# Patient Record
Sex: Female | Born: 1970 | Race: White | Hispanic: No | Smoking: Never smoker
Health system: Southern US, Community
[De-identification: ages and names within clinical notes are randomized; demographics above are authoritative.]

## PROBLEM LIST (undated history)

## (undated) DIAGNOSIS — J45909 Unspecified asthma, uncomplicated: Secondary | ICD-10-CM

## (undated) DIAGNOSIS — C801 Malignant (primary) neoplasm, unspecified: Secondary | ICD-10-CM

## (undated) HISTORY — PX: WISDOM TOOTH EXTRACTION: SHX21

## (undated) HISTORY — PX: NASAL SINUS SURGERY: SHX719

## (undated) HISTORY — PX: RHINOPLASTY: SUR1284

---

## 1998-05-31 ENCOUNTER — Inpatient Hospital Stay (HOSPITAL_COMMUNITY): Admission: AD | Admit: 1998-05-31 | Discharge: 1998-06-02 | Payer: Self-pay | Admitting: *Deleted

## 2002-03-27 ENCOUNTER — Other Ambulatory Visit: Admission: RE | Admit: 2002-03-27 | Discharge: 2002-03-27 | Payer: Self-pay | Admitting: *Deleted

## 2003-04-06 ENCOUNTER — Other Ambulatory Visit: Admission: RE | Admit: 2003-04-06 | Discharge: 2003-04-06 | Payer: Self-pay | Admitting: *Deleted

## 2004-04-12 ENCOUNTER — Other Ambulatory Visit: Admission: RE | Admit: 2004-04-12 | Discharge: 2004-04-12 | Payer: Self-pay | Admitting: Obstetrics and Gynecology

## 2005-04-24 ENCOUNTER — Other Ambulatory Visit: Admission: RE | Admit: 2005-04-24 | Discharge: 2005-04-24 | Payer: Self-pay | Admitting: Obstetrics and Gynecology

## 2007-08-13 ENCOUNTER — Encounter: Admission: RE | Admit: 2007-08-13 | Discharge: 2007-08-13 | Payer: Self-pay | Admitting: Obstetrics and Gynecology

## 2010-12-05 ENCOUNTER — Other Ambulatory Visit: Payer: Self-pay | Admitting: Obstetrics and Gynecology

## 2010-12-05 DIAGNOSIS — R928 Other abnormal and inconclusive findings on diagnostic imaging of breast: Secondary | ICD-10-CM

## 2010-12-12 ENCOUNTER — Ambulatory Visit
Admission: RE | Admit: 2010-12-12 | Discharge: 2010-12-12 | Disposition: A | Discharge status: Home / Self Care | Payer: BC Managed Care – PPO | Source: Ambulatory Visit | Attending: Obstetrics and Gynecology | Admitting: Obstetrics and Gynecology

## 2010-12-12 DIAGNOSIS — R928 Other abnormal and inconclusive findings on diagnostic imaging of breast: Secondary | ICD-10-CM

## 2013-01-01 ENCOUNTER — Other Ambulatory Visit: Payer: Self-pay | Admitting: Obstetrics and Gynecology

## 2013-01-01 DIAGNOSIS — Z803 Family history of malignant neoplasm of breast: Secondary | ICD-10-CM

## 2015-03-28 ENCOUNTER — Encounter: Payer: Self-pay | Admitting: *Deleted

## 2015-03-30 ENCOUNTER — Ambulatory Visit (INDEPENDENT_AMBULATORY_CARE_PROVIDER_SITE_OTHER): Payer: 59 | Admitting: *Deleted

## 2015-03-30 DIAGNOSIS — I781 Nevus, non-neoplastic: Secondary | ICD-10-CM

## 2015-03-30 DIAGNOSIS — I8393 Asymptomatic varicose veins of bilateral lower extremities: Secondary | ICD-10-CM

## 2015-03-30 NOTE — Progress Notes (Signed)
This patient has tiny red capillaries that she wants removed. Used CL since all vessels are too tiny to inject. Did all leg ares and a few on her face. Increase strength since she is olive complection. Suggested [facility]'s bigger head to get tiny cheek vessels. Follow prn.  Cutaneous Laser:pulsed mode  816j/cm2 400 ms delay  13 ms Duration 0.5 spot  Total pulses:875 Total energy 1.394  Total time::10  Face: 490j/cm2  21ms   Pulses: 134  Energy: .156  Photos: No.  Compression stockings applied: No.

## 2015-04-04 ENCOUNTER — Encounter: Payer: Self-pay | Admitting: *Deleted

## 2016-06-04 ENCOUNTER — Encounter: Payer: Self-pay | Admitting: *Deleted

## 2016-06-06 ENCOUNTER — Ambulatory Visit (INDEPENDENT_AMBULATORY_CARE_PROVIDER_SITE_OTHER): Payer: Self-pay | Admitting: *Deleted

## 2016-06-06 DIAGNOSIS — I781 Nevus, non-neoplastic: Secondary | ICD-10-CM

## 2016-06-06 NOTE — Progress Notes (Signed)
X=No results found for: HIV1RNAQUANT% Sotradecol administered with a 27g butterfly.  Patient received a total of 5cc.  Cutaneous Laser:pulsed mode  816j/cm2 400 ms delay  13 ms Duration 0.5 spot  Total pulses: 601 Total energy 957.68  Total time::07    Compression stockings applied: Yes.     Treated her small spiders with a combo of sclero and CL. Not the easiest of access d/t size. Hoping for results she is happy with. Follow prn.

## 2016-11-28 DIAGNOSIS — E785 Hyperlipidemia, unspecified: Secondary | ICD-10-CM | POA: Insufficient documentation

## 2016-11-28 DIAGNOSIS — J309 Allergic rhinitis, unspecified: Secondary | ICD-10-CM | POA: Insufficient documentation

## 2016-12-13 DIAGNOSIS — J45909 Unspecified asthma, uncomplicated: Secondary | ICD-10-CM | POA: Insufficient documentation

## 2017-12-27 DIAGNOSIS — L239 Allergic contact dermatitis, unspecified cause: Secondary | ICD-10-CM | POA: Insufficient documentation

## 2018-02-28 ENCOUNTER — Other Ambulatory Visit: Payer: Self-pay | Admitting: Obstetrics and Gynecology

## 2018-02-28 DIAGNOSIS — Z803 Family history of malignant neoplasm of breast: Secondary | ICD-10-CM

## 2018-05-02 ENCOUNTER — Other Ambulatory Visit: Payer: Self-pay

## 2018-05-16 ENCOUNTER — Ambulatory Visit
Admission: RE | Admit: 2018-05-16 | Discharge: 2018-05-16 | Disposition: A | Discharge status: Home / Self Care | Payer: BLUE CROSS/BLUE SHIELD | Source: Ambulatory Visit | Attending: Obstetrics and Gynecology | Admitting: Obstetrics and Gynecology

## 2018-05-16 DIAGNOSIS — Z803 Family history of malignant neoplasm of breast: Secondary | ICD-10-CM

## 2018-05-16 MED ORDER — GADOBUTROL 1 MMOL/ML IV SOLN
6.0000 mL | Freq: Once | INTRAVENOUS | Status: AC | PRN
  Administered 2018-05-16: 6 mL via INTRAVENOUS

## 2019-02-25 ENCOUNTER — Other Ambulatory Visit: Payer: Self-pay | Admitting: Obstetrics and Gynecology

## 2019-02-25 DIAGNOSIS — R928 Other abnormal and inconclusive findings on diagnostic imaging of breast: Secondary | ICD-10-CM

## 2019-03-06 ENCOUNTER — Other Ambulatory Visit: Payer: Self-pay

## 2019-03-06 ENCOUNTER — Other Ambulatory Visit: Payer: Self-pay | Admitting: Obstetrics and Gynecology

## 2019-03-06 ENCOUNTER — Ambulatory Visit
Admission: RE | Admit: 2019-03-06 | Discharge: 2019-03-06 | Disposition: A | Discharge status: Home / Self Care | Payer: BLUE CROSS/BLUE SHIELD | Source: Ambulatory Visit | Attending: Obstetrics and Gynecology | Admitting: Obstetrics and Gynecology

## 2019-03-06 ENCOUNTER — Ambulatory Visit
Admission: RE | Admit: 2019-03-06 | Discharge: 2019-03-06 | Disposition: A | Discharge status: Home / Self Care | Payer: BC Managed Care – PPO | Source: Ambulatory Visit | Attending: Obstetrics and Gynecology | Admitting: Obstetrics and Gynecology

## 2019-03-06 DIAGNOSIS — R599 Enlarged lymph nodes, unspecified: Secondary | ICD-10-CM

## 2019-03-06 DIAGNOSIS — R928 Other abnormal and inconclusive findings on diagnostic imaging of breast: Secondary | ICD-10-CM

## 2019-03-06 DIAGNOSIS — N6320 Unspecified lump in the left breast, unspecified quadrant: Secondary | ICD-10-CM

## 2019-03-13 ENCOUNTER — Ambulatory Visit
Admission: RE | Admit: 2019-03-13 | Discharge: 2019-03-13 | Disposition: A | Discharge status: Home / Self Care | Payer: BC Managed Care – PPO | Source: Ambulatory Visit | Attending: Obstetrics and Gynecology | Admitting: Obstetrics and Gynecology

## 2019-03-13 ENCOUNTER — Other Ambulatory Visit: Payer: Self-pay

## 2019-03-13 DIAGNOSIS — N6320 Unspecified lump in the left breast, unspecified quadrant: Secondary | ICD-10-CM

## 2019-03-13 DIAGNOSIS — R599 Enlarged lymph nodes, unspecified: Secondary | ICD-10-CM

## 2019-03-17 ENCOUNTER — Other Ambulatory Visit: Payer: Self-pay | Admitting: General Surgery

## 2019-03-17 DIAGNOSIS — C50312 Malignant neoplasm of lower-inner quadrant of left female breast: Secondary | ICD-10-CM

## 2019-03-19 ENCOUNTER — Telehealth: Payer: Self-pay | Admitting: Hematology and Oncology

## 2019-03-19 NOTE — Telephone Encounter (Signed)
Received a new patient referral from Dr. Marlou Starks for breast cancer. Alice Davis has been cld and scheduled to see Alice Davis on 11/17 at 3:45pm. She's been made aware to arrive 15 minutes early.

## 2019-03-23 ENCOUNTER — Ambulatory Visit: Payer: BC Managed Care – PPO | Admitting: Hematology and Oncology

## 2019-03-23 NOTE — Progress Notes (Signed)
[facility] CONSULT NOTE  Patient Care Team: Patient, No Pcp Per as PCP - General (General Practice)  CHIEF COMPLAINTS/PURPOSE OF CONSULTATION:  Newly diagnosed breast cancer  HISTORY OF PRESENTING ILLNESS:  Alice Davis 48 y.o. female is here because of recent diagnosis of invasive mammary carcinoma of the left breast. The cancer was detected on a routine screening mammogram and was not palpable prior to diagnosis. Diagnostic mammogram on 03/06/19 showed a 0.6cm mass at the 8:30 position and a 0.9cm mass at the 8 o'clock position in the left breast, with 1 abnormal left axillary lymph node. Biopsy on 03/13/19 showed, at the 8:30 position, invasive mammary carcinoma and mammary carcinoma in situ, grade 2, HER-2 negative (1+), ER+ 90%, PR+ 90%, Ki67 20%, and at the 8:00 position, HER-2 negative (0), ER+ 80%, PR+ 80%, Ki67 5%, with the lymph node negative for carcinoma. She presents to the clinic today for initial evaluation and discussion of treatment options.  She took birth control pills for a year  I reviewed her records extensively and collaborated the history with the patient.  SUMMARY OF ONCOLOGIC HISTORY: Oncology History  Malignant neoplasm of lower-inner quadrant of left breast in female, estrogen receptor positive ([place])  03/13/2019 Initial Diagnosis   Screening detected left breast abnormalities, 0.6 cm at 8:30 position and 0.9 cm at 8 o'clock position with 1 abnormal left axillary lymph node.  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  Drinks alcohol once a week. Recently married a year ago to her current husband.  She has a son 62 years of age who has  realistic company.  FAMILY HISTORY: Mother had breast cancer. Genetics were performed on her and they were negative. ALLERGIES:  has no allergies on file.  MEDICATIONS: Multivitamin REVIEW OF SYSTEMS:   Constitutional: Denies fevers, chills or abnormal night sweats Eyes: Denies blurriness of vision, double vision or watery eyes Ears, nose, mouth, throat, and face: Denies mucositis or sore throat Respiratory: Denies cough, dyspnea or wheezes Cardiovascular: Denies palpitation, chest discomfort or lower extremity swelling Gastrointestinal:  Denies nausea, heartburn or change in bowel habits Skin: Denies abnormal skin rashes Lymphatics: Denies new lymphadenopathy or easy bruising Neurological:Denies numbness, tingling or new weaknesses Behavioral/Psych: Mood is stable, no new changes  Breast: Denies any palpable lumps or discharge All other systems were reviewed with the patient and are negative.  PHYSICAL EXAMINATION: ECOG PERFORMANCE STATUS: 1 - Symptomatic but completely ambulatory  Vitals:   03/24/19 1541  BP: 117/67  Pulse: 90  Resp: 17  Temp: 98.2 F (36.8 C)  SpO2: 100%   Filed Weights   03/24/19 1541  Weight: 140 lb 14.4 oz (63.9 kg)    GENERAL:alert, no distress and comfortable SKIN: skin color, texture, turgor are normal, no rashes or significant lesions EYES: normal, conjunctiva are pink and non-injected, sclera clear OROPHARYNX:no exudate, no erythema and lips, buccal mucosa, and tongue normal  NECK: supple, thyroid normal size, non-tender, without nodularity LYMPH:  no palpable lymphadenopathy in the cervical, axillary or inguinal LUNGS: clear to auscultation and percussion with normal breathing effort HEART: regular rate & rhythm and no murmurs and no lower extremity edema ABDOMEN:abdomen soft, non-tender and normal bowel  sounds Musculoskeletal:no cyanosis of digits and no clubbing  PSYCH: alert & oriented x 3 with fluent speech NEURO: no focal  motor/sensory deficits BREAST: No palpable nodules in breast. No palpable axillary or supraclavicular lymphadenopathy (exam performed in the presence of a chaperone)   RADIOGRAPHIC STUDIES: I have personally reviewed the radiological reports and agreed with the findings in the report.  ASSESSMENT AND PLAN:  Malignant neoplasm of lower-inner quadrant of left breast in female, estrogen receptor positive ([place]) 03/13/2019:Screening detected left breast abnormalities, 0.6 cm at 8:30 position and 0.9 cm at 8 o'clock position with 1 abnormal left axillary lymph node.  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 After reviewing the pathology in detail, we proceeded to discuss the different treatment options between surgery, radiation, chemotherapy, antiestrogen therapies.  Recommendations: Breast MRI will be performed. Depending on the results the surgical plans will be made by Dr. Marlou Starks.  1. Breast conserving surgery with sentinel lymph node biopsy followed by 2. Oncotype DX testing to determine if chemotherapy would be of any benefit followed by 3. Adjuvant radiation therapy followed by 4. Adjuvant antiestrogen therapy: We discussed tamoxifen for 10 years versus tamoxifen for 3 years followed by aromatase inhibitor for 5 years if she is menopausal.  Oncotype counseling: I discussed Oncotype DX test. I explained to the patient that this is a 21 gene panel to evaluate patient tumors DNA to calculate recurrence score. This would help determine whether patient has high risk  or intermediate risk or low risk breast cancer. She understands that if her tumor was found to be high risk, she would benefit from systemic chemotherapy. If low risk, no need of chemotherapy. If she was found to be intermediate risk, we would need to evaluate the score as well as other risk factors and determine if an abbreviated chemotherapy may be of benefit.  Return to clinic after surgery to discuss final pathology report and then determine if Oncotype DX testing will need to be sent.  All questions were answered. The patient knows to call the clinic with any problems, questions or concerns.   Rulon Eisenmenger, MD, MPH 03/24/2019    I, Molly Dorshimer, am acting as scribe for [name], MD.  I have reviewed the above documentation for accuracy and completeness, and I agree with the above.

## 2019-03-24 ENCOUNTER — Other Ambulatory Visit: Payer: Self-pay

## 2019-03-24 ENCOUNTER — Inpatient Hospital Stay: Payer: BC Managed Care – PPO | Attending: Hematology and Oncology | Admitting: Hematology and Oncology

## 2019-03-24 DIAGNOSIS — C50312 Malignant neoplasm of lower-inner quadrant of left female breast: Secondary | ICD-10-CM | POA: Insufficient documentation

## 2019-03-24 DIAGNOSIS — Z170 Estrogen receptor positive status [ER+]: Secondary | ICD-10-CM | POA: Diagnosis not present

## 2019-03-24 NOTE — Assessment & Plan Note (Addendum)
03/13/2019:Screening detected left breast abnormalities, 0.6 cm at 8:30 position and 0.9 cm at 8 o'clock position with 1 abnormal left axillary lymph node.  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 After reviewing the pathology in detail, we proceeded to discuss the different treatment options between surgery, radiation, chemotherapy, antiestrogen therapies.  Recommendations: 1. Breast conserving surgery followed by 2. Oncotype DX testing to determine if chemotherapy would be of any benefit followed by 3. Adjuvant radiation therapy followed by 4. Adjuvant antiestrogen therapy  Oncotype counseling: I discussed Oncotype DX test. I explained to the patient that this is a 21 gene panel to evaluate patient tumors DNA to calculate recurrence score. This would help determine whether patient has high risk or intermediate risk or low risk breast cancer. She understands that if her tumor was found to be high risk, she would benefit from systemic chemotherapy. If low risk, no need of chemotherapy. If she was found to be intermediate risk, we would need to evaluate the score as well as other risk factors and determine if an abbreviated chemotherapy may be of benefit.  Return to clinic after surgery to discuss final pathology report and then determine if Oncotype DX testing will need to be sent.

## 2019-03-25 NOTE — Progress Notes (Signed)
Location of Breast Cancer: Left Breast  Histology per Pathology Report:  03/13/19 Diagnosis 1. 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  Receptor Status: ER(90%), PR (90%), Her2-neu (NEG), Ki-(20%)  2. Breast, left, needle core biopsy, 8 o'clock - INVASIVE MAMMARY CARCINOMA. MAMMARY CARCINOMA IN SITU.  Receptor Status: ER(80%), PR (80%), Her2-neu (NEG), Ki-(5%)  3. Lymph node, needle/core biopsy, left axilla - NODAL TISSUE, NEGATIVE FOR CARCINOMA.  Did patient present with symptoms or was this found on screening mammography?: It was found on a routine screening mammogram.   Past/Anticipated interventions by surgeon, if any: Dr. Marlou Starks saw initially on 03/17/19   Past/Anticipated interventions by medical oncology, if any:  03/24/19 Dr. Lindi Adie Recommendations: Breast MRI will be performed. Depending on the results the surgical plans will be made by Dr. Marlou Starks.  1. Breast conserving surgery with sentinel lymph node biopsy followed by 2. Oncotype DX testing to determine if chemotherapy would be of any benefit followed by 3. Adjuvant radiation therapy followed by 4. Adjuvant antiestrogen therapy: We discussed tamoxifen for 10 years versus tamoxifen for 3 years followed by aromatase inhibitor for 5 years if she is menopausal.  Lymphedema issues, if any:  N/A  Pain issues, if any:  She denies  SAFETY ISSUES:  Prior radiation? No  Pacemaker/ICD? No  Possible current pregnancy? Her husband has had a vasectomy.   Is the patient on methotrexate? No  Current Complaints / other details:   03/27/19 MRI completed.     Mareena Cavan, Stephani Police, RN 03/25/2019,3:55 PM

## 2019-03-26 ENCOUNTER — Telehealth: Payer: Self-pay | Admitting: *Deleted

## 2019-03-26 NOTE — Telephone Encounter (Signed)
Called pt to provide navigation resources and contact information. Discussed next steps. Denies questions or concerns regarding dx or treatment care plan. Encourage pt to call with needs. Received verbal understanding.

## 2019-03-27 ENCOUNTER — Other Ambulatory Visit: Payer: Self-pay

## 2019-03-27 ENCOUNTER — Ambulatory Visit
Admission: RE | Admit: 2019-03-27 | Discharge: 2019-03-27 | Disposition: A | Discharge status: Home / Self Care | Payer: BC Managed Care – PPO | Source: Ambulatory Visit | Attending: General Surgery | Admitting: General Surgery

## 2019-03-27 DIAGNOSIS — C50312 Malignant neoplasm of lower-inner quadrant of left female breast: Secondary | ICD-10-CM

## 2019-03-27 MED ORDER — GADOBUTROL 1 MMOL/ML IV SOLN
6.0000 mL | Freq: Once | INTRAVENOUS | Status: AC | PRN
  Administered 2019-03-27: 6 mL via INTRAVENOUS

## 2019-03-30 ENCOUNTER — Telehealth: Payer: Self-pay | Admitting: Radiation Oncology

## 2019-03-30 NOTE — Progress Notes (Addendum)
Radiation Oncology         [phone] ________________________________  Initial outpatient Consultation by WebEx Video due to pandemic precautions  Name: Alice Davis MRN: [id number]  Date: 03/31/2019  DOB: 20-Oct-1970  EN:MMHWKGS, Maebelle Munroe, MD  [name], MD   REFERRING PHYSICIAN: Autumn Messing III, MD  DIAGNOSIS:    ICD-10-CM   1. Malignant neoplasm of lower-inner quadrant of left breast in female, estrogen receptor positive ([place])  C50.312    Z17.0    STAGE IA cT1b cN0 cM0 ER+ PR+ HER2- G2 Left  LIQ breast cancer   CHIEF COMPLAINT: Here to discuss management of left breast cancer  HISTORY OF PRESENT ILLNESS::Alice Davis is a 48 y.o. female who presented with breast abnormality on the following imaging: screening mammogram on the date of 02/26/2019.  Symptoms, if any, at that time, were: none.   Ultrasound of breast on 03/06/2019 revealed 0.6 cm mass in left breast at 8:30; 0.9 cm mass in left breast at 8 o'clock; one abnormal left lymph node.   Biopsy on date of 03/13/2019 showed invasive lobular carcinoma with LCIS in both 8:30 and 8 o'clock masses.  ER status: positive; PR status positive, Her2 status negative; Grade 2.  Biopsied lymph node was negative for carcinoma.  Breast MRI performed on 03/27/2019 revealed: suspicious non-mass enhancement involving the entire lower-inner quadrant of the left breast, spanning at least 6-7 cm, the two biopsy-proven malignancy sites are contained within this region; additional suspicious non-mass enhancement involving the lower-outer quadrant of the left breast; suspicious non-mass enhancement involving the lower-inner and lower-outer quadrants of the right breast; no suspicious lymphadenopathy.  Genetic testing was negative at [facility], per patient.   PREVIOUS RADIATION THERAPY: No  PAST MEDICAL HISTORY:  has a past medical history of Asthma and Cancer ([place]).    PAST SURGICAL HISTORY: Past Surgical History:  Procedure  Laterality Date  . BREAST RECONSTRUCTION WITH PLACEMENT OF TISSUE EXPANDER AND FLEX HD (ACELLULAR HYDRATED DERMIS) Bilateral 05/14/2019   Procedure: IMMEDIATE BREAST RECONSTRUCTION WITH PLACEMENT OF TISSUE EXPANDER AND FLEX HD (ACELLULAR HYDRATED DERMIS);  Surgeon: Wallace Going, DO;  Location: [facility];  Service: Plastics;  Laterality: Bilateral;  . NASAL SINUS SURGERY     20 years ago  . NIPPLE SPARING MASTECTOMY WITH SENTINEL LYMPH NODE BIOPSY Bilateral 05/14/2019   Procedure: BILATERAL NIPPLE SPARING MASTECTOMIES WITH LEFT SENTINEL LYMPH NODE BIOPSY;  Surgeon: [name], MD;  Location: [facility];  Service: General;  Laterality: Bilateral;  PEC  . RHINOPLASTY     20 years  . WISDOM TOOTH EXTRACTION      FAMILY HISTORY:  Mother - breast cancer.  SOCIAL HISTORY:  reports that she has never smoked. She has never used smokeless tobacco. She reports previous alcohol use. She reports that she does not use drugs.  ALLERGIES: Latex  MEDICATIONS:  Current Outpatient Medications  Medication Sig Dispense Refill  . cetirizine (ZYRTEC) 10 MG tablet Take 10 mg by mouth daily.     . Multiple Vitamins-Minerals (MULTIVITAMIN ADULT EXTRA C PO) Take 1 tablet by mouth daily.     Marland Kitchen albuterol (VENTOLIN HFA) 108 (90 Base) MCG/ACT inhaler Inhale 2 puffs into the lungs every 6 (six) hours as needed for wheezing or shortness of breath.     . cyclobenzaprine (FLEXERIL) 5 MG tablet Take 1 tablet (5 mg total) by mouth 2 (two) times daily as needed for up to 20 doses for muscle spasms. 20 tablet 0  . ELDERBERRY PO  Take 15 mLs by mouth daily.    . ondansetron (ZOFRAN) 4 MG tablet Take 1 tablet (4 mg total) by mouth every 8 (eight) hours as needed for nausea or vomiting. 20 tablet 0   No current facility-administered medications for this encounter.    REVIEW OF SYSTEMS: As above   PHYSICAL EXAM:  vitals were not taken for this visit.   General: Alert and oriented, in no acute distress    LABS: NA    RADIOGRAPHY: No results found.    IMPRESSION/PLAN: Left Breast Cancer   This is a lovely 48 year old woman with left breast cancer and LCIS.  MRI shows extensive non-mass-like enhancement which will require further biopsies of both breasts before her surgical options are clear.  She is interested in breast conservation if possible, but would like to see a plastic surgeon if mastectomy is recommended.  We discussed the risks benefits and side effects of radiotherapy and the indications for radiotherapy after breast cancer surgery.  She understands that radiotherapy may be needed after mastectomy, depending on the extent of disease and final stage.  If she pursues breast conservation, radiotherapy will be recommended automatically.  It was a pleasure meeting the patient today.  The patient is enthusiastic about proceeding with treatment if indicated. I look forward to participating in the patient's care.  I will await her referral back to me for postoperative follow-up and eventual CT simulation/treatment planning if warranted.  This encounter was provided by telemedicine platform Webex.  The patient has given verbal consent for this type of encounter and has been advised to only accept a meeting of this type in a secure network environment. The time spent during this encounter was 35 minutes. The attendants for this meeting include [name]  and [employer].  During the encounter, [name] was located at [facility] Radiation Oncology Department.  Stefanny Tejada was located at home.    __________________________________________   [name], MD   This document serves as a record of services personally performed by [name], MD. It was created on her behalf by Wilburn Mylar, a trained medical scribe. The creation of this record is based on the scribe's personal observations and the provider's statements to them. This document has been checked and approved by  the attending provider.

## 2019-03-31 ENCOUNTER — Ambulatory Visit
Admission: RE | Admit: 2019-03-31 | Discharge: 2019-03-31 | Disposition: A | Discharge status: Home / Self Care | Payer: BC Managed Care – PPO | Source: Ambulatory Visit | Attending: Radiation Oncology | Admitting: Radiation Oncology

## 2019-03-31 ENCOUNTER — Encounter: Payer: Self-pay | Admitting: Radiation Oncology

## 2019-03-31 ENCOUNTER — Other Ambulatory Visit: Payer: Self-pay

## 2019-03-31 ENCOUNTER — Other Ambulatory Visit: Payer: Self-pay | Admitting: General Surgery

## 2019-03-31 DIAGNOSIS — Z170 Estrogen receptor positive status [ER+]: Secondary | ICD-10-CM

## 2019-03-31 DIAGNOSIS — R9389 Abnormal findings on diagnostic imaging of other specified body structures: Secondary | ICD-10-CM

## 2019-03-31 DIAGNOSIS — C50312 Malignant neoplasm of lower-inner quadrant of left female breast: Secondary | ICD-10-CM

## 2019-03-31 HISTORY — DX: Unspecified asthma, uncomplicated: J45.909

## 2019-04-06 ENCOUNTER — Encounter: Payer: Self-pay | Admitting: *Deleted

## 2019-04-08 ENCOUNTER — Telehealth: Payer: Self-pay | Admitting: *Deleted

## 2019-04-08 NOTE — Telephone Encounter (Signed)
Message left for [employer].  Currently her F.M.L.A form is ready for pick up at front registration desk.

## 2019-04-09 ENCOUNTER — Other Ambulatory Visit: Payer: Self-pay | Admitting: Body Imaging

## 2019-04-09 ENCOUNTER — Ambulatory Visit
Admission: RE | Admit: 2019-04-09 | Discharge: 2019-04-09 | Disposition: A | Discharge status: Home / Self Care | Payer: BC Managed Care – PPO | Source: Ambulatory Visit | Attending: General Surgery | Admitting: General Surgery

## 2019-04-09 ENCOUNTER — Other Ambulatory Visit: Payer: Self-pay

## 2019-04-09 DIAGNOSIS — R9389 Abnormal findings on diagnostic imaging of other specified body structures: Secondary | ICD-10-CM

## 2019-04-09 MED ORDER — GADOBUTROL 1 MMOL/ML IV SOLN
6.0000 mL | Freq: Once | INTRAVENOUS | Status: AC | PRN
  Administered 2019-04-09: 6 mL via INTRAVENOUS

## 2019-04-20 ENCOUNTER — Telehealth: Payer: Self-pay

## 2019-04-20 NOTE — Telephone Encounter (Signed)

## 2019-04-21 ENCOUNTER — Other Ambulatory Visit: Payer: Self-pay

## 2019-04-21 ENCOUNTER — Encounter: Payer: Self-pay | Admitting: Plastic Surgery

## 2019-04-21 ENCOUNTER — Ambulatory Visit (INDEPENDENT_AMBULATORY_CARE_PROVIDER_SITE_OTHER): Payer: BC Managed Care – PPO | Admitting: Plastic Surgery

## 2019-04-21 ENCOUNTER — Encounter: Payer: Self-pay | Admitting: *Deleted

## 2019-04-21 VITALS — BP 103/66 | HR 95 | Temp 98.0°F | Ht 67.0 in | Wt 142.6 lb

## 2019-04-21 DIAGNOSIS — C50312 Malignant neoplasm of lower-inner quadrant of left female breast: Secondary | ICD-10-CM | POA: Diagnosis not present

## 2019-04-21 DIAGNOSIS — Z170 Estrogen receptor positive status [ER+]: Secondary | ICD-10-CM | POA: Diagnosis not present

## 2019-04-21 NOTE — Progress Notes (Signed)
Patient ID: Alice Davis, female    DOB: 08-05-1970, 48 y.o.   MRN: [id number]   Chief Complaint  Patient presents with  . Advice Only    for (B) breast reconstruction   . Breast Problem    The patient here for a consultation for breast reconstruction.  She was diagnosed with invasive mammary carcinoma of the left breast.  This was after a screening mammogram and then a diagnostic mammogram 03/06/1999.  She had a mass at the 8 o'clock position and an abnormal left axillary lymph node.  Lower inner quadrant is the location.  The biopsy from November 6 showed invasive mammary carcinoma and mammary carcinoma in situ.  It was estrogen and progesterone positive, HER-2 negative and Ki-67 20%.  Her mom also had breast cancer.  She is otherwise in good health.  She is 5 feet 7inches tall and weighs 142 pounds.  Her preoperative bra is a 36B cup.  She would like to be around the same size.  She also had a recent MRI that showed some abnormalities in the right breast although negative.  This is making her want to decide on bilateral mastectomies.  The biopsy sites are healing well.  She is a little tender on the lower aspect of the left breast.   Review of Systems  Constitutional: Negative.  Negative for activity change and appetite change.  HENT: Negative.   Eyes: Negative.   Respiratory: Negative.  Negative for chest tightness.   Cardiovascular: Negative.   Gastrointestinal: Negative.   Endocrine: Negative.   Genitourinary: Negative.   Musculoskeletal: Negative.   Neurological: Negative.   Hematological: Negative.   Psychiatric/Behavioral: Negative.     Past Medical History:  Diagnosis Date  . Asthma     Past Surgical History:  Procedure Laterality Date  . NASAL SINUS SURGERY     20 years ago  . RHINOPLASTY     20 years  . WISDOM TOOTH EXTRACTION        Current Outpatient Medications:  .  albuterol (VENTOLIN HFA) 108 (90 Base) MCG/ACT inhaler, albuterol sulfate HFA 90  mcg/actuation aerosol inhaler  INL 1 TO 2 PFS PO Q 6 H PRN, Disp: , Rfl:  .  cetirizine (ZYRTEC) 10 MG tablet, Take by mouth., Disp: , Rfl:  .  Multiple Vitamins-Minerals (MULTIVITAMIN ADULT EXTRA C PO), multivitamin, Disp: , Rfl:    Objective:   Vitals:   04/21/19 0959  BP: 103/66  Pulse: 95  Temp: 98 F (36.7 C)  SpO2: 100%    Physical Exam Vitals and nursing note reviewed.  Constitutional:      Appearance: Normal appearance.  HENT:     Head: Normocephalic and atraumatic.  Eyes:     Extraocular Movements: Extraocular movements intact.  Cardiovascular:     Rate and Rhythm: Normal rate.  Pulmonary:     Effort: Pulmonary effort is normal. No respiratory distress.  Abdominal:     General: Abdomen is flat. There is no distension.     Tenderness: There is no abdominal tenderness.  Musculoskeletal:        General: No swelling.     Cervical back: No rigidity.  Skin:    General: Skin is warm.     Capillary Refill: Capillary refill takes less than 2 seconds.  Neurological:     General: No focal deficit present.     Mental Status: She is alert and oriented to person, place, and time.  Psychiatric:  Mood and Affect: Mood normal.        Behavior: Behavior normal.        Thought Content: Thought content normal.     Assessment & Plan:  Malignant neoplasm of lower-inner quadrant of left breast in female, estrogen receptor positive ([place])  We had a detailed conversation about the patient's options for breast reconstruction. Several reconstruction options were explained to the patient.  It is important to remember that breast reconstruction is an optional procedure. Reconstruction often requires several stages of surgery and this means more than one operation.  The surgeries are often done several months apart.  The entire process from start to finish can take a year or more. The major goal of breast reconstruction is to look normal in clothing. There will always be scars and a  difference noticeable without clothes.  This is true for asymmetries where both breasts will not be identical.  Surgery may be needed or desired to the non-cancerous breast in order to achieve better symmetry and satisfactory results.  Regardless of the reconstructive method, there is always risks and the possibility that the procedure will fail or have complications.  This couls required additional surgeries.    We discussed the available methods of breast reconstruction and included:  1. Tissue expander with Acellular dermal matrix followed by implant based reconstruction. This can be done as one surgery or multiple surgeries.  2. Autologous reconstruction can include using a muscle or tissue from another area of the body for the reconstruction.  3. Combined procedures like the latissismus dorsi flaps that often uses the muscle with an expander or implant.  For each of the method discussed the risks, benefits, scars and recovery time were discussed in detail. Specific risks included bleeding, infection, hematoma, seroma, scarring, pain, wound healing complications, flap loss, fat necrosis, capsular contracture, need for implant removal, donor site complications, bulge, hernia, umbilical necrosis, need for urgent reoperation, and need for dressing changes.   After the options were discussed we focused on the patient's desires and the procedure that was best for her based on all the information.  A total of 45 minutes of face-to-face time was spent in this encounter, of which >65% was spent in counseling.   The patient is a very good candidate for bilateral immediate reconstruction with an inframammary incision and nipple areola sparing.  We discussed the options as laid out above and she agrees with subpectoral placement of the expanders with Flex HD.  Pictures were obtained of the patient and placed in the chart with the patient's or guardian's permission.   The [place] was signed  into law in 2016 which includes the topic of electronic health records.  This provides immediate access to information in MyChart.  This includes consultation notes, operative notes, office notes, lab results and pathology reports.  If you have any questions about what you read please let us know at your next visit or call us at the office.  We are right here with you.     [place], DO

## 2019-04-24 ENCOUNTER — Ambulatory Visit (HOSPITAL_COMMUNITY): Payer: Self-pay | Admitting: General Surgery

## 2019-04-24 DIAGNOSIS — Z170 Estrogen receptor positive status [ER+]: Secondary | ICD-10-CM

## 2019-04-24 DIAGNOSIS — C50312 Malignant neoplasm of lower-inner quadrant of left female breast: Secondary | ICD-10-CM

## 2019-04-24 NOTE — Telephone Encounter (Signed)
Spoke with patient about close margins to skin.  She was very disappointment to learn that her risks of recurrence will be significantly higher if she does a skin / nipple sparing mastectomy.  The recommendation is to do a standard mastectomy.  She is in agreement.  She would like a date as quickly as possible.

## 2019-04-29 ENCOUNTER — Telehealth: Payer: Self-pay | Admitting: Hematology and Oncology

## 2019-04-29 ENCOUNTER — Telehealth: Payer: Self-pay | Admitting: Nurse Practitioner

## 2019-04-29 NOTE — Telephone Encounter (Signed)
Scheduled appt per 12/22 sch message - pt is aware of appt date and time   

## 2019-04-29 NOTE — Telephone Encounter (Signed)
Error

## 2019-05-05 ENCOUNTER — Encounter: Payer: Self-pay | Admitting: *Deleted

## 2019-05-05 ENCOUNTER — Other Ambulatory Visit: Payer: Self-pay

## 2019-05-05 ENCOUNTER — Ambulatory Visit (INDEPENDENT_AMBULATORY_CARE_PROVIDER_SITE_OTHER): Payer: BC Managed Care – PPO | Admitting: Surgical

## 2019-05-05 ENCOUNTER — Encounter: Payer: Self-pay | Admitting: Surgical

## 2019-05-05 VITALS — BP 123/75 | HR 82 | Temp 98.2°F | Ht 67.0 in | Wt 138.0 lb

## 2019-05-05 DIAGNOSIS — Z170 Estrogen receptor positive status [ER+]: Secondary | ICD-10-CM

## 2019-05-05 DIAGNOSIS — C50312 Malignant neoplasm of lower-inner quadrant of left female breast: Secondary | ICD-10-CM

## 2019-05-05 MED ORDER — CYCLOBENZAPRINE HCL 5 MG PO TABS: 5.0000 mg | ORAL_TABLET | Freq: Two times a day (BID) | ORAL | 0 refills | Status: DC | PRN

## 2019-05-05 MED ORDER — CEPHALEXIN 500 MG PO CAPS: 500.0000 mg | ORAL_CAPSULE | Freq: Four times a day (QID) | ORAL | 0 refills | Status: AC

## 2019-05-05 MED ORDER — HYDROCODONE-ACETAMINOPHEN 5-325 MG PO TABS: 1.0000 | ORAL_TABLET | Freq: Four times a day (QID) | ORAL | 0 refills | Status: AC | PRN

## 2019-05-05 MED ORDER — ONDANSETRON HCL 4 MG PO TABS: 4.0000 mg | ORAL_TABLET | Freq: Three times a day (TID) | ORAL | 0 refills | Status: DC | PRN

## 2019-05-05 NOTE — Progress Notes (Signed)
Patient ID: Alice Davis, female    DOB: 08-10-1970, 48 y.o.   MRN: [id number]  Chief Complaint  Patient presents with  . Follow-up      ICD-10-CM   1. Malignant neoplasm of lower-inner quadrant of left breast in female, estrogen receptor positive ([place])  C50.312    Z17.0     History of Present Illness: Alice Davis is a 48 y.o.  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 This was diagnosed on March 13, 2019 after a breast biopsy.She presents for preoperative evaluation for upcoming procedure, immediate breast reconstruction with placement of tissue expanders and flex HD, scheduled for 05/14/19 with Dr. Marla Roe after bilateral nipple sparing mastectomy with L sentinel lymph node biopsy by Dr. Marlou Starks.  The patient has not had problems with anesthesia.   PMHx of asthma - well controlled. No PMHX of DVT/PE, she was previously on OCP, but is no longer taking this (d/c'ed early November). No hx of bleeding/clotting disorders. She does not smoke and is generally healthy. No recent colds/illnesses.   Past Medical History: Allergies: Allergies  Allergen Reactions  . Latex Itching and Rash    Current Medications:  Current Outpatient Medications:  .  albuterol (VENTOLIN HFA) 108 (90 Base) MCG/ACT inhaler, Inhale 2 puffs into the lungs every 6 (six) hours as needed for wheezing or shortness of breath. , Disp: , Rfl:  .  cephALEXin (KEFLEX) 500 MG capsule, Take 1 capsule (500 mg total) by mouth 4 (four) times daily for 5 days., Disp: 20 capsule, Rfl: 0 .  cetirizine (ZYRTEC) 10 MG tablet, Take 10 mg by mouth daily. , Disp: , Rfl:  .  cyclobenzaprine (FLEXERIL) 5 MG tablet, Take 1 tablet (5 mg total) by mouth 2 (two) times daily as needed for up to 20 doses for muscle spasms., Disp: 20 tablet, Rfl: 0 .  ELDERBERRY PO, Take 15 mLs by mouth daily., Disp: , Rfl:  .  HYDROcodone-acetaminophen (NORCO) 5-325 MG tablet, Take 1  tablet by mouth every 6 (six) hours as needed for up to 5 days for moderate pain or severe pain., Disp: 20 tablet, Rfl: 0 .  Multiple Vitamins-Minerals (MULTIVITAMIN ADULT EXTRA C PO), Take 1 tablet by mouth daily. , Disp: , Rfl:  .  ondansetron (ZOFRAN) 4 MG tablet, Take 1 tablet (4 mg total) by mouth every 8 (eight) hours as needed for nausea or vomiting., Disp: 20 tablet, Rfl: 0  Past Medical Problems: Past Medical History:  Diagnosis Date  . Asthma     Past Surgical History: Past Surgical History:  Procedure Laterality Date  . NASAL SINUS SURGERY     20 years ago  . RHINOPLASTY     20 years  . WISDOM TOOTH EXTRACTION      Social History: Social History   Socioeconomic History  . Marital status: Married    Spouse name: Not on file  . Number of children: Not on file  . Years of education: Not on file  . Highest education level: Not on file  Occupational History  . Not on file  Tobacco Use  . Smoking status: Never Smoker  . Smokeless tobacco: Never Used  Substance and Sexual Activity  . Alcohol use: Not Currently  . Drug use: Never  . Sexual activity: Not on file  Other Topics Concern  . Not on file  Social History Narrative  . Not on file   Social Determinants  of Health   Financial Resource Strain:   . Difficulty of Paying Living Expenses: Not on file  Food Insecurity:   . Worried About Charity fundraiser in the Last Year: Not on file  . Ran Out of Food in the Last Year: Not on file  Transportation Needs: No Transportation Needs  . Lack of Transportation (Medical): No  . Lack of Transportation (Non-Medical): No  Physical Activity:   . Days of Exercise per Week: Not on file  . Minutes of Exercise per Session: Not on file  Stress:   . Feeling of Stress : Not on file  Social Connections:   . Frequency of Communication with Friends and Family: Not on file  . Frequency of Social Gatherings with Friends and Family: Not on file  . Attends Religious Services:  Not on file  . Active Member of Clubs or Organizations: Not on file  . Attends Archivist Meetings: Not on file  . Marital Status: Not on file  Intimate Partner Violence: Not At Risk  . Fear of Current or Ex-Partner: No  . Emotionally Abused: No  . Physically Abused: No  . Sexually Abused: No    Family History: No family history on file.  Review of Systems: Review of Systems  Constitutional: Negative.   Respiratory: Negative.   Cardiovascular: Negative.   Gastrointestinal: Negative.   Genitourinary: Negative.   Musculoskeletal: Negative.   Skin: Negative.   Neurological: Negative.    Physical Exam: Vital Signs BP 123/75 (BP Location: Right Arm, Patient Position: Sitting, Cuff Size: Normal)   Pulse 82   Temp 98.2 F (36.8 C) (Temporal)   Ht 5' 7"  (1.702 m)   Wt 138 lb (62.6 kg)   LMP 04/19/2019 (Exact Date)   SpO2 100%   BMI 21.61 kg/m  Physical Exam Exam conducted with a chaperone present.  Constitutional:      General: She is not in acute distress.    Appearance: Normal appearance. She is not ill-appearing.  HENT:     Head: Normocephalic and atraumatic.  Eyes:     Pupils: Pupils are equal, round Neck:     Musculoskeletal: Normal range of motion.  Cardiovascular:     Rate and Rhythm: Normal rate and regular rhythm.     Pulses: Normal pulses.     Heart sounds: Normal heart sounds. No murmur.  Pulmonary:     Effort: Pulmonary effort is normal. No respiratory distress.     Breath sounds: Normal breath sounds. No wheezing.  Abdominal:     General: Abdomen is flat. There is no distension.     Palpations: Abdomen is soft.     Tenderness: There is no abdominal tenderness.  Musculoskeletal: Normal range of motion.  Skin:    General: Skin is warm and dry.     Findings: No erythema or rash.  Neurological:     General: No focal deficit present.     Mental Status: She is alert and oriented to person, place, and time. Mental status is at baseline.      Motor: No weakness.  Psychiatric:        Mood and Affect: Mood normal.        Behavior: Behavior normal.   Assessment/Plan: The patient is scheduled for immediate breast reconstruction with placement of tissue expanders and flex HD, scheduled for 05/14/19 with Dr. Marla Roe after bilateral nipple sparing mastectomy with L sentinel lymph node biopsy by Dr. Marlou Starks.  Risks, benefits, and alternatives of procedure discussed,  questions answered and consent obtained.    Rx sent to pharmacy. Recommend ibuprofen 664m q6hrs x 48-72 hrs postop then PRN as well as 500 mg APAP q6 hrs x 48-72 hrs postop then PRN. If necessary, use Norco for severe pain or if having difficulty sleeping due to pain.  Patient previously used flexeril for muscle spasms and would like to try this instead of valium, this will be fine. If she does not do well with flexeril, we can try valium at that time.  We thoroughly covered all questions, general surgical risks and risks associated with expander placement and reconstruction.  Electronically signed by: MCarola RhineScheeler, PA-C 05/05/2019 4:14 PM

## 2019-05-05 NOTE — H&P (View-Only) (Signed)
Patient ID: Alice Davis, female    DOB: Apr 17, 1971, 48 y.o.   MRN: [id number]  Chief Complaint  Patient presents with  . Follow-up      ICD-10-CM   1. Malignant neoplasm of lower-inner quadrant of left breast in female, estrogen receptor positive ([place])  C50.312    Z17.0     History of Present Illness: Alice Davis is a 48 y.o.  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 This was diagnosed on March 13, 2019 after a breast biopsy.She presents for preoperative evaluation for upcoming procedure, immediate breast reconstruction with placement of tissue expanders and flex HD, scheduled for 05/14/19 with Dr. Marla Roe after bilateral nipple sparing mastectomy with L sentinel lymph node biopsy by Dr. Marlou Starks.  The patient has not had problems with anesthesia.   PMHx of asthma - well controlled. No PMHX of DVT/PE, she was previously on OCP, but is no longer taking this (d/c'ed early November). No hx of bleeding/clotting disorders. She does not smoke and is generally healthy. No recent colds/illnesses.   Past Medical History: Allergies: Allergies  Allergen Reactions  . Latex Itching and Rash    Current Medications:  Current Outpatient Medications:  .  albuterol (VENTOLIN HFA) 108 (90 Base) MCG/ACT inhaler, Inhale 2 puffs into the lungs every 6 (six) hours as needed for wheezing or shortness of breath. , Disp: , Rfl:  .  cephALEXin (KEFLEX) 500 MG capsule, Take 1 capsule (500 mg total) by mouth 4 (four) times daily for 5 days., Disp: 20 capsule, Rfl: 0 .  cetirizine (ZYRTEC) 10 MG tablet, Take 10 mg by mouth daily. , Disp: , Rfl:  .  cyclobenzaprine (FLEXERIL) 5 MG tablet, Take 1 tablet (5 mg total) by mouth 2 (two) times daily as needed for up to 20 doses for muscle spasms., Disp: 20 tablet, Rfl: 0 .  ELDERBERRY PO, Take 15 mLs by mouth daily., Disp: , Rfl:  .  HYDROcodone-acetaminophen (NORCO) 5-325 MG tablet, Take 1  tablet by mouth every 6 (six) hours as needed for up to 5 days for moderate pain or severe pain., Disp: 20 tablet, Rfl: 0 .  Multiple Vitamins-Minerals (MULTIVITAMIN ADULT EXTRA C PO), Take 1 tablet by mouth daily. , Disp: , Rfl:  .  ondansetron (ZOFRAN) 4 MG tablet, Take 1 tablet (4 mg total) by mouth every 8 (eight) hours as needed for nausea or vomiting., Disp: 20 tablet, Rfl: 0  Past Medical Problems: Past Medical History:  Diagnosis Date  . Asthma     Past Surgical History: Past Surgical History:  Procedure Laterality Date  . NASAL SINUS SURGERY     20 years ago  . RHINOPLASTY     20 years  . WISDOM TOOTH EXTRACTION      Social History: Social History   Socioeconomic History  . Marital status: Married    Spouse name: Not on file  . Number of children: Not on file  . Years of education: Not on file  . Highest education level: Not on file  Occupational History  . Not on file  Tobacco Use  . Smoking status: Never Smoker  . Smokeless tobacco: Never Used  Substance and Sexual Activity  . Alcohol use: Not Currently  . Drug use: Never  . Sexual activity: Not on file  Other Topics Concern  . Not on file  Social History Narrative  . Not on file   Social Determinants  of Health   Financial Resource Strain:   . Difficulty of Paying Living Expenses: Not on file  Food Insecurity:   . Worried About Charity fundraiser in the Last Year: Not on file  . Ran Out of Food in the Last Year: Not on file  Transportation Needs: No Transportation Needs  . Lack of Transportation (Medical): No  . Lack of Transportation (Non-Medical): No  Physical Activity:   . Days of Exercise per Week: Not on file  . Minutes of Exercise per Session: Not on file  Stress:   . Feeling of Stress : Not on file  Social Connections:   . Frequency of Communication with Friends and Family: Not on file  . Frequency of Social Gatherings with Friends and Family: Not on file  . Attends Religious Services:  Not on file  . Active Member of Clubs or Organizations: Not on file  . Attends Archivist Meetings: Not on file  . Marital Status: Not on file  Intimate Partner Violence: Not At Risk  . Fear of Current or Ex-Partner: No  . Emotionally Abused: No  . Physically Abused: No  . Sexually Abused: No    Family History: No family history on file.  Review of Systems: Review of Systems  Constitutional: Negative.   Respiratory: Negative.   Cardiovascular: Negative.   Gastrointestinal: Negative.   Genitourinary: Negative.   Musculoskeletal: Negative.   Skin: Negative.   Neurological: Negative.    Physical Exam: Vital Signs BP 123/75 (BP Location: Right Arm, Patient Position: Sitting, Cuff Size: Normal)   Pulse 82   Temp 98.2 F (36.8 C) (Temporal)   Ht 5' 7"  (1.702 m)   Wt 138 lb (62.6 kg)   LMP 04/19/2019 (Exact Date)   SpO2 100%   BMI 21.61 kg/m  Physical Exam Exam conducted with a chaperone present.  Constitutional:      General: She is not in acute distress.    Appearance: Normal appearance. She is not ill-appearing.  HENT:     Head: Normocephalic and atraumatic.  Eyes:     Pupils: Pupils are equal, round Neck:     Musculoskeletal: Normal range of motion.  Cardiovascular:     Rate and Rhythm: Normal rate and regular rhythm.     Pulses: Normal pulses.     Heart sounds: Normal heart sounds. No murmur.  Pulmonary:     Effort: Pulmonary effort is normal. No respiratory distress.     Breath sounds: Normal breath sounds. No wheezing.  Abdominal:     General: Abdomen is flat. There is no distension.     Palpations: Abdomen is soft.     Tenderness: There is no abdominal tenderness.  Musculoskeletal: Normal range of motion.  Skin:    General: Skin is warm and dry.     Findings: No erythema or rash.  Neurological:     General: No focal deficit present.     Mental Status: She is alert and oriented to person, place, and time. Mental status is at baseline.      Motor: No weakness.  Psychiatric:        Mood and Affect: Mood normal.        Behavior: Behavior normal.   Assessment/Plan: The patient is scheduled for immediate breast reconstruction with placement of tissue expanders and flex HD, scheduled for 05/14/19 with Dr. Marla Roe after bilateral nipple sparing mastectomy with L sentinel lymph node biopsy by Dr. Marlou Starks.  Risks, benefits, and alternatives of procedure discussed,  questions answered and consent obtained.    Rx sent to pharmacy. Recommend ibuprofen 628m q6hrs x 48-72 hrs postop then PRN as well as 500 mg APAP q6 hrs x 48-72 hrs postop then PRN. If necessary, use Norco for severe pain or if having difficulty sleeping due to pain.  Patient previously used flexeril for muscle spasms and would like to try this instead of valium, this will be fine. If she does not do well with flexeril, we can try valium at that time.  We thoroughly covered all questions, general surgical risks and risks associated with expander placement and reconstruction.  Electronically signed by: MCarola RhineScheeler, PA-C 05/05/2019 4:14 PM

## 2019-05-07 NOTE — Progress Notes (Signed)
[facility] [place], [place] AT [facility] [address] [place] [address] Phone: [phone] Fax: [phone]      Your procedure is scheduled on 05/14/2019.  Report to [facility] Main Entrance "A" at 06:30 A.M., and check in at the Admitting office.  Call this number if you have problems the morning of surgery:  [phone]  Call [phone] if you have any questions prior to your surgery date Monday-Friday 8am-4pm    Remember:  Do not eat after midnight the night before your surgery  You may drink clear liquids until 05:30 the morning of your surgery.   Clear liquids allowed are: Water, Non-Citrus Juices (without pulp), Carbonated Beverages, Clear Tea, Black Coffee Only, and Gatorade    Take these medicines the morning of surgery with A SIP OF WATER: Cetirizine (Zyrtec) Cyclobenzaprine (Flexeril) - if needed Hydrocodone-acetaminophen (Norco) - if needed Ondansetron (zofran) - if needed  7 days prior to surgery STOP taking any Aspirin (unless otherwise instructed by your surgeon), Aleve, Naproxen, Ibuprofen, Motrin, Advil, Goody's, BC's, all herbal medications, fish oil, and all vitamins.  Please bring all inhalers with you the day of surgery.     The Morning of Surgery  Do not wear jewelry, make-up or nail polish.  Do not wear lotions, powders, perfumes, or deodorant  Do not shave 48 hours prior to surgery.   Do not bring valuables to the hospital.  [facility] is not responsible for any belongings or valuables.  If you are a smoker, DO NOT Smoke 24 hours prior to surgery  If you wear a CPAP at night please bring your mask, tubing, and machine the morning of surgery   Remember that you must have someone to transport you home after your surgery, and remain with you for 24 hours if you are discharged the same day.   Please bring cases for contacts, glasses, hearing aids, dentures or bridgework  because it cannot be worn into surgery.    Leave your suitcase in the car.  After surgery it may be brought to your room.  For patients admitted to the hospital, discharge time will be determined by your treatment team.  Patients discharged the day of surgery will not be allowed to drive home.    Special instructions:   [place]- Preparing For Surgery  Before surgery, you can play an important role. Because skin is not sterile, your skin needs to be as free of germs as possible. You can reduce the number of germs on your skin by washing with CHG (chlorahexidine gluconate) Soap before surgery.  CHG is an antiseptic cleaner which kills germs and bonds with the skin to continue killing germs even after washing.    Oral Hygiene is also important to reduce your risk of infection.  Remember - BRUSH YOUR TEETH THE MORNING OF SURGERY WITH YOUR REGULAR TOOTHPASTE  Please do not use if you have an allergy to CHG or antibacterial soaps. If your skin becomes reddened/irritated stop using the CHG.  Do not shave (including legs and underarms) for at least 48 hours prior to first CHG shower. It is OK to shave your face.  Please follow these instructions carefully.   1. Shower the NIGHT BEFORE SURGERY and the MORNING OF SURGERY with CHG Soap.   2. If you chose to wash your hair, wash your hair first as usual with your normal shampoo.  3. After you shampoo, rinse your  hair and body thoroughly to remove the shampoo.  4. Use CHG as you would any other liquid soap. You can apply CHG directly to the skin and wash gently with a scrungie or a clean washcloth.   5. Apply the CHG Soap to your body ONLY FROM THE NECK DOWN.  Do not use on open wounds or open sores. Avoid contact with your eyes, ears, mouth and genitals (private parts). Wash Face and genitals (private parts)  with your normal soap.   6. Wash thoroughly, paying special attention to the area where your surgery will be  performed.  7. Thoroughly rinse your body with warm water from the neck down.  8. DO NOT shower/wash with your normal soap after using and rinsing off the CHG Soap.  9. Pat yourself dry with a CLEAN TOWEL.  10. Wear CLEAN PAJAMAS to bed the night before surgery, wear comfortable clothes the morning of surgery  11. Place CLEAN SHEETS on your bed the night of your first shower and DO NOT SLEEP WITH PETS.    Day of Surgery:  Please shower the morning of surgery with the CHG soap Do not apply any deodorants/lotions. Please wear clean clothes to the hospital/surgery center.   Remember to brush your teeth WITH YOUR REGULAR TOOTHPASTE.   Please read over the following fact sheets that you were given.

## 2019-05-11 ENCOUNTER — Other Ambulatory Visit: Payer: Self-pay

## 2019-05-11 ENCOUNTER — Other Ambulatory Visit (HOSPITAL_COMMUNITY)
Admission: RE | Admit: 2019-05-11 | Discharge: 2019-05-11 | Disposition: A | Discharge status: Home / Self Care | Payer: BC Managed Care – PPO | Source: Ambulatory Visit | Attending: General Surgery | Admitting: General Surgery

## 2019-05-11 ENCOUNTER — Encounter (HOSPITAL_COMMUNITY): Payer: Self-pay

## 2019-05-11 ENCOUNTER — Encounter (HOSPITAL_COMMUNITY)
Admission: RE | Admit: 2019-05-11 | Discharge: 2019-05-11 | Disposition: A | Discharge status: Home / Self Care | Payer: BC Managed Care – PPO | Source: Ambulatory Visit | Attending: General Surgery | Admitting: General Surgery

## 2019-05-11 DIAGNOSIS — Z01812 Encounter for preprocedural laboratory examination: Secondary | ICD-10-CM | POA: Insufficient documentation

## 2019-05-11 DIAGNOSIS — N6489 Other specified disorders of breast: Secondary | ICD-10-CM | POA: Diagnosis not present

## 2019-05-11 DIAGNOSIS — C50912 Malignant neoplasm of unspecified site of left female breast: Secondary | ICD-10-CM | POA: Insufficient documentation

## 2019-05-11 DIAGNOSIS — Z20822 Contact with and (suspected) exposure to covid-19: Secondary | ICD-10-CM | POA: Insufficient documentation

## 2019-05-11 HISTORY — DX: Malignant (primary) neoplasm, unspecified: C80.1

## 2019-05-11 LAB — CBC
HCT: 44.1 % (ref 36.0–46.0)
Hemoglobin: 14.1 g/dL (ref 12.0–15.0)
MCH: 30 pg (ref 26.0–34.0)
MCHC: 32 g/dL (ref 30.0–36.0)
MCV: 93.8 fL (ref 80.0–100.0)
Platelets: 279 10*3/uL (ref 150–400)
RBC: 4.7 MIL/uL (ref 3.87–5.11)
RDW: 11.9 % (ref 11.5–15.5)
WBC: 6.5 10*3/uL (ref 4.0–10.5)
nRBC: 0 % (ref 0.0–0.2)

## 2019-05-11 NOTE — Progress Notes (Signed)
PCP - Horald Pollen Cardiologist - denies   Chest x-ray - N/A EKG - N/A Stress Test - denies ECHO - denies Cardiac Cath - denies  Sleep Study - denies  Aspirin Instructions: Patient instructed to hold all Aspirin, NSAID's, herbal medications, fish oil and vitamins 7 days prior to surgery.   ERAS Protcol -clear liquids until 0530 DOS PRE-SURGERY Ensure or G2- not ordered  COVID TEST- 05/11/19 at [facility] Pt instructed to remain in their car. Educated on Transport planner until [employer].    Anesthesia review:   Patient denies shortness of breath, fever, cough and chest pain at PAT appointment   All instructions explained to the patient, with a verbal understanding of the material. Patient agrees to go over the instructions while at home for a better understanding. Patient also instructed to self quarantine after being tested for COVID-19. The opportunity to ask questions was provided.

## 2019-05-12 LAB — NOVEL CORONAVIRUS, NAA (HOSP ORDER, SEND-OUT TO REF LAB; TAT 18-24 HRS): SARS-CoV-2, NAA: NOT DETECTED

## 2019-05-14 ENCOUNTER — Encounter (HOSPITAL_COMMUNITY): Payer: Self-pay | Admitting: General Surgery

## 2019-05-14 ENCOUNTER — Encounter (HOSPITAL_COMMUNITY)
Admission: RE | Disposition: A | Discharge status: Home / Self Care | Payer: Self-pay | Source: Home / Self Care | Attending: Plastic Surgery

## 2019-05-14 ENCOUNTER — Observation Stay (HOSPITAL_COMMUNITY)
Admission: RE | Admit: 2019-05-14 | Discharge: 2019-05-15 | Disposition: A | Discharge status: Home / Self Care | Payer: BC Managed Care – PPO | Attending: Plastic Surgery | Admitting: Plastic Surgery

## 2019-05-14 ENCOUNTER — Ambulatory Visit (HOSPITAL_COMMUNITY): Payer: BC Managed Care – PPO | Admitting: Certified Registered Nurse Anesthetist

## 2019-05-14 ENCOUNTER — Ambulatory Visit (HOSPITAL_COMMUNITY)
Admission: RE | Admit: 2019-05-14 | Discharge: 2019-05-14 | Disposition: A | Discharge status: Home / Self Care | Payer: BC Managed Care – PPO | Source: Ambulatory Visit | Attending: General Surgery | Admitting: General Surgery

## 2019-05-14 ENCOUNTER — Other Ambulatory Visit: Payer: Self-pay

## 2019-05-14 DIAGNOSIS — C50312 Malignant neoplasm of lower-inner quadrant of left female breast: Principal | ICD-10-CM | POA: Insufficient documentation

## 2019-05-14 DIAGNOSIS — Z170 Estrogen receptor positive status [ER+]: Secondary | ICD-10-CM

## 2019-05-14 DIAGNOSIS — J45909 Unspecified asthma, uncomplicated: Secondary | ICD-10-CM | POA: Diagnosis not present

## 2019-05-14 DIAGNOSIS — C773 Secondary and unspecified malignant neoplasm of axilla and upper limb lymph nodes: Secondary | ICD-10-CM | POA: Insufficient documentation

## 2019-05-14 DIAGNOSIS — N6489 Other specified disorders of breast: Secondary | ICD-10-CM | POA: Insufficient documentation

## 2019-05-14 DIAGNOSIS — Z20822 Contact with and (suspected) exposure to covid-19: Secondary | ICD-10-CM | POA: Insufficient documentation

## 2019-05-14 DIAGNOSIS — Z79899 Other long term (current) drug therapy: Secondary | ICD-10-CM | POA: Insufficient documentation

## 2019-05-14 DIAGNOSIS — C50919 Malignant neoplasm of unspecified site of unspecified female breast: Secondary | ICD-10-CM | POA: Diagnosis present

## 2019-05-14 DIAGNOSIS — Z803 Family history of malignant neoplasm of breast: Secondary | ICD-10-CM | POA: Insufficient documentation

## 2019-05-14 HISTORY — PX: BREAST RECONSTRUCTION WITH PLACEMENT OF TISSUE EXPANDER AND FLEX HD (ACELLULAR HYDRATED DERMIS): SHX6295

## 2019-05-14 HISTORY — PX: NIPPLE SPARING MASTECTOMY WITH SENTINEL LYMPH NODE BIOPSY: SHX6826

## 2019-05-14 LAB — POCT PREGNANCY, URINE: Preg Test, Ur: NEGATIVE

## 2019-05-14 LAB — HIV ANTIBODY (ROUTINE TESTING W REFLEX): HIV Screen 4th Generation wRfx: NONREACTIVE

## 2019-05-14 SURGERY — NIPPLE SPARING MASTECTOMY WITH SENTINEL LYMPH NODE BIOPSY
Anesthesia: General | Site: Breast | Laterality: Bilateral

## 2019-05-14 MED ORDER — LIDOCAINE 2% (20 MG/ML) 5 ML SYRINGE
INTRAMUSCULAR | Status: DC | PRN
  Administered 2019-05-14: 40 mg via INTRAVENOUS

## 2019-05-14 MED ORDER — FENTANYL CITRATE (PF) 100 MCG/2ML IJ SOLN: 50.0000 ug | Freq: Once | INTRAMUSCULAR | Status: AC

## 2019-05-14 MED ORDER — DIPHENHYDRAMINE HCL 50 MG/ML IJ SOLN
INTRAMUSCULAR | Status: DC | PRN
  Administered 2019-05-14: 12.5 mg via INTRAVENOUS

## 2019-05-14 MED ORDER — LIDOCAINE 2% (20 MG/ML) 5 ML SYRINGE
INTRAMUSCULAR | Status: AC
  Filled 2019-05-14: qty 5

## 2019-05-14 MED ORDER — HYDROCODONE-ACETAMINOPHEN 5-325 MG PO TABS: 1.0000 | ORAL_TABLET | Freq: Four times a day (QID) | ORAL | Status: DC | PRN

## 2019-05-14 MED ORDER — SUGAMMADEX SODIUM 200 MG/2ML IV SOLN
INTRAVENOUS | Status: DC | PRN
  Administered 2019-05-14: 125.2 mg via INTRAVENOUS

## 2019-05-14 MED ORDER — DIPHENHYDRAMINE HCL 50 MG/ML IJ SOLN
INTRAMUSCULAR | Status: AC
  Filled 2019-05-14: qty 1

## 2019-05-14 MED ORDER — PHENYLEPHRINE 40 MCG/ML (10ML) SYRINGE FOR IV PUSH (FOR BLOOD PRESSURE SUPPORT)
PREFILLED_SYRINGE | INTRAVENOUS | Status: DC | PRN
  Administered 2019-05-14 (×5): 80 ug via INTRAVENOUS

## 2019-05-14 MED ORDER — DIPHENHYDRAMINE HCL 50 MG/ML IJ SOLN: 12.5000 mg | Freq: Four times a day (QID) | INTRAMUSCULAR | Status: DC | PRN

## 2019-05-14 MED ORDER — DEXAMETHASONE SODIUM PHOSPHATE 10 MG/ML IJ SOLN
INTRAMUSCULAR | Status: AC
  Filled 2019-05-14: qty 1

## 2019-05-14 MED ORDER — CELECOXIB 200 MG PO CAPS
200.0000 mg | ORAL_CAPSULE | ORAL | Status: AC
  Administered 2019-05-14: 200 mg via ORAL
  Filled 2019-05-14: qty 1

## 2019-05-14 MED ORDER — LIDOCAINE-EPINEPHRINE 1 %-1:100000 IJ SOLN
INTRAMUSCULAR | Status: AC
  Filled 2019-05-14: qty 1

## 2019-05-14 MED ORDER — PROPOFOL 10 MG/ML IV BOLUS
INTRAVENOUS | Status: DC | PRN
  Administered 2019-05-14: 150 mg via INTRAVENOUS

## 2019-05-14 MED ORDER — CHLORHEXIDINE GLUCONATE CLOTH 2 % EX PADS: 6.0000 | MEDICATED_PAD | Freq: Once | CUTANEOUS | Status: DC

## 2019-05-14 MED ORDER — POLYETHYLENE GLYCOL 3350 17 G PO PACK: 17.0000 g | PACK | Freq: Every day | ORAL | Status: DC | PRN

## 2019-05-14 MED ORDER — METOCLOPRAMIDE HCL 5 MG/ML IJ SOLN
INTRAMUSCULAR | Status: DC | PRN
  Administered 2019-05-14: 10 mg via INTRAVENOUS

## 2019-05-14 MED ORDER — KCL IN DEXTROSE-NACL 20-5-0.45 MEQ/L-%-% IV SOLN
INTRAVENOUS | Status: DC
  Filled 2019-05-14: qty 1000

## 2019-05-14 MED ORDER — STERILE WATER FOR IRRIGATION IR SOLN
Status: DC | PRN
  Administered 2019-05-14: 1000 mL

## 2019-05-14 MED ORDER — OXYCODONE HCL 5 MG/5ML PO SOLN: 5.0000 mg | Freq: Once | ORAL | Status: DC | PRN

## 2019-05-14 MED ORDER — FENTANYL CITRATE (PF) 100 MCG/2ML IJ SOLN
INTRAMUSCULAR | Status: AC
  Administered 2019-05-14: 50 ug via INTRAVENOUS
  Filled 2019-05-14: qty 2

## 2019-05-14 MED ORDER — FENTANYL CITRATE (PF) 100 MCG/2ML IJ SOLN
INTRAMUSCULAR | Status: AC
  Filled 2019-05-14: qty 2

## 2019-05-14 MED ORDER — ONDANSETRON HCL 4 MG/2ML IJ SOLN: 4.0000 mg | Freq: Four times a day (QID) | INTRAMUSCULAR | Status: DC | PRN

## 2019-05-14 MED ORDER — PROMETHAZINE HCL 25 MG/ML IJ SOLN: 6.2500 mg | INTRAMUSCULAR | Status: DC | PRN

## 2019-05-14 MED ORDER — LACTATED RINGERS IV SOLN: INTRAVENOUS | Status: DC

## 2019-05-14 MED ORDER — MIDAZOLAM HCL 2 MG/2ML IJ SOLN
INTRAMUSCULAR | Status: AC
  Administered 2019-05-14: 2 mg via INTRAVENOUS
  Filled 2019-05-14: qty 2

## 2019-05-14 MED ORDER — 0.9 % SODIUM CHLORIDE (POUR BTL) OPTIME
TOPICAL | Status: DC | PRN
  Administered 2019-05-14 (×2): 1000 mL

## 2019-05-14 MED ORDER — SCOPOLAMINE 1 MG/3DAYS TD PT72
MEDICATED_PATCH | TRANSDERMAL | Status: DC | PRN
  Administered 2019-05-14: 1 via TRANSDERMAL

## 2019-05-14 MED ORDER — PHENYLEPHRINE 40 MCG/ML (10ML) SYRINGE FOR IV PUSH (FOR BLOOD PRESSURE SUPPORT)
PREFILLED_SYRINGE | INTRAVENOUS | Status: AC
  Filled 2019-05-14: qty 10

## 2019-05-14 MED ORDER — DEXAMETHASONE SODIUM PHOSPHATE 10 MG/ML IJ SOLN
INTRAMUSCULAR | Status: DC | PRN
  Administered 2019-05-14: 5 mg via INTRAVENOUS

## 2019-05-14 MED ORDER — FENTANYL CITRATE (PF) 250 MCG/5ML IJ SOLN
INTRAMUSCULAR | Status: AC
  Filled 2019-05-14: qty 5

## 2019-05-14 MED ORDER — SENNA 8.6 MG PO TABS
1.0000 | ORAL_TABLET | Freq: Two times a day (BID) | ORAL | Status: DC
  Administered 2019-05-14: 8.6 mg via ORAL
  Filled 2019-05-14: qty 1

## 2019-05-14 MED ORDER — ROCURONIUM BROMIDE 10 MG/ML (PF) SYRINGE
PREFILLED_SYRINGE | INTRAVENOUS | Status: AC
  Filled 2019-05-14: qty 10

## 2019-05-14 MED ORDER — OXYCODONE HCL 5 MG PO TABS: 5.0000 mg | ORAL_TABLET | Freq: Once | ORAL | Status: DC | PRN

## 2019-05-14 MED ORDER — CEFAZOLIN SODIUM-DEXTROSE 2-4 GM/100ML-% IV SOLN
2.0000 g | Freq: Three times a day (TID) | INTRAVENOUS | Status: DC
  Administered 2019-05-14 – 2019-05-15 (×2): 2 g via INTRAVENOUS
  Filled 2019-05-14 (×3): qty 100

## 2019-05-14 MED ORDER — CEFAZOLIN SODIUM-DEXTROSE 2-4 GM/100ML-% IV SOLN
2.0000 g | INTRAVENOUS | Status: AC
  Administered 2019-05-14: 2 g via INTRAVENOUS
  Filled 2019-05-14: qty 100

## 2019-05-14 MED ORDER — FENTANYL CITRATE (PF) 100 MCG/2ML IJ SOLN
25.0000 ug | INTRAMUSCULAR | Status: DC | PRN
  Administered 2019-05-14: 25 ug via INTRAVENOUS

## 2019-05-14 MED ORDER — MIDAZOLAM HCL 2 MG/2ML IJ SOLN
INTRAMUSCULAR | Status: DC | PRN
  Administered 2019-05-14: 2 mg via INTRAVENOUS

## 2019-05-14 MED ORDER — METOCLOPRAMIDE HCL 5 MG/ML IJ SOLN
INTRAMUSCULAR | Status: AC
  Filled 2019-05-14: qty 2

## 2019-05-14 MED ORDER — PROPOFOL 10 MG/ML IV BOLUS
INTRAVENOUS | Status: AC
  Filled 2019-05-14: qty 20

## 2019-05-14 MED ORDER — ACETAMINOPHEN 500 MG PO TABS
1000.0000 mg | ORAL_TABLET | ORAL | Status: AC
  Administered 2019-05-14: 1000 mg via ORAL
  Filled 2019-05-14: qty 2

## 2019-05-14 MED ORDER — ONDANSETRON 4 MG PO TBDP: 4.0000 mg | ORAL_TABLET | Freq: Four times a day (QID) | ORAL | Status: DC | PRN

## 2019-05-14 MED ORDER — SODIUM CHLORIDE 0.9 % IV SOLN
INTRAVENOUS | Status: DC | PRN
  Administered 2019-05-14: 500 mL

## 2019-05-14 MED ORDER — METHYLENE BLUE 0.5 % INJ SOLN
INTRAVENOUS | Status: AC
  Filled 2019-05-14: qty 10

## 2019-05-14 MED ORDER — BUPIVACAINE HCL 0.5 % IJ SOLN
INTRAMUSCULAR | Status: AC
  Filled 2019-05-14: qty 1

## 2019-05-14 MED ORDER — IBUPROFEN 600 MG PO TABS
600.0000 mg | ORAL_TABLET | Freq: Four times a day (QID) | ORAL | Status: DC
  Administered 2019-05-14 – 2019-05-15 (×2): 600 mg via ORAL
  Filled 2019-05-14 (×3): qty 1

## 2019-05-14 MED ORDER — SODIUM CHLORIDE (PF) 0.9 % IJ SOLN
INTRAMUSCULAR | Status: AC
  Filled 2019-05-14: qty 10

## 2019-05-14 MED ORDER — DIAZEPAM 2 MG PO TABS: 2.0000 mg | ORAL_TABLET | Freq: Two times a day (BID) | ORAL | Status: DC | PRN

## 2019-05-14 MED ORDER — TECHNETIUM TC 99M SULFUR COLLOID FILTERED
1.0000 | Freq: Once | INTRAVENOUS | Status: AC | PRN
  Administered 2019-05-14: 09:00:00 1 via INTRADERMAL

## 2019-05-14 MED ORDER — DIPHENHYDRAMINE HCL 12.5 MG/5ML PO ELIX: 12.5000 mg | ORAL_SOLUTION | Freq: Four times a day (QID) | ORAL | Status: DC | PRN

## 2019-05-14 MED ORDER — ROCURONIUM BROMIDE 10 MG/ML (PF) SYRINGE
PREFILLED_SYRINGE | INTRAVENOUS | Status: DC | PRN
  Administered 2019-05-14: 60 mg via INTRAVENOUS

## 2019-05-14 MED ORDER — MIDAZOLAM HCL 2 MG/2ML IJ SOLN: 2.0000 mg | Freq: Once | INTRAMUSCULAR | Status: AC

## 2019-05-14 MED ORDER — GABAPENTIN 300 MG PO CAPS
300.0000 mg | ORAL_CAPSULE | ORAL | Status: AC
  Administered 2019-05-14: 300 mg via ORAL
  Filled 2019-05-14: qty 1

## 2019-05-14 MED ORDER — ONDANSETRON HCL 4 MG/2ML IJ SOLN
INTRAMUSCULAR | Status: AC
  Filled 2019-05-14: qty 2

## 2019-05-14 MED ORDER — FENTANYL CITRATE (PF) 250 MCG/5ML IJ SOLN
INTRAMUSCULAR | Status: DC | PRN
  Administered 2019-05-14 (×2): 25 ug via INTRAVENOUS
  Administered 2019-05-14 (×3): 50 ug via INTRAVENOUS

## 2019-05-14 MED ORDER — MIDAZOLAM HCL 2 MG/2ML IJ SOLN
INTRAMUSCULAR | Status: AC
  Filled 2019-05-14: qty 2

## 2019-05-14 MED ORDER — HYDROMORPHONE HCL 1 MG/ML IJ SOLN: 1.0000 mg | INTRAMUSCULAR | Status: DC | PRN

## 2019-05-14 MED ORDER — PHENYLEPHRINE HCL-NACL 10-0.9 MG/250ML-% IV SOLN
INTRAVENOUS | Status: DC | PRN
  Administered 2019-05-14: 25 ug/min via INTRAVENOUS

## 2019-05-14 MED ORDER — ACETAMINOPHEN 325 MG PO TABS
325.0000 mg | ORAL_TABLET | Freq: Three times a day (TID) | ORAL | Status: DC | PRN
  Administered 2019-05-14: 325 mg via ORAL
  Filled 2019-05-14: qty 1

## 2019-05-14 MED ORDER — ONDANSETRON HCL 4 MG/2ML IJ SOLN
INTRAMUSCULAR | Status: DC | PRN
  Administered 2019-05-14: 4 mg via INTRAVENOUS

## 2019-05-14 SURGICAL SUPPLY — 59 items
ADH SKN CLS APL DERMABOND .7 (GAUZE/BANDAGES/DRESSINGS) ×4
APL PRP STRL LF DISP 70% ISPRP (MISCELLANEOUS) ×2
APPLIER CLIP 9.375 MED OPEN (MISCELLANEOUS) ×6
APR CLP MED 9.3 20 MLT OPN (MISCELLANEOUS) ×4
BAG DECANTER FOR FLEXI CONT (MISCELLANEOUS) ×3 IMPLANT
BINDER BREAST LRG (GAUZE/BANDAGES/DRESSINGS) ×1 IMPLANT
BINDER BREAST XLRG (GAUZE/BANDAGES/DRESSINGS) IMPLANT
BIOPATCH RED 1 DISK 7.0 (GAUZE/BANDAGES/DRESSINGS) ×6 IMPLANT
CANISTER SUCT 3000ML PPV (MISCELLANEOUS) ×3 IMPLANT
CHLORAPREP W/TINT 26 (MISCELLANEOUS) ×3 IMPLANT
CLIP APPLIE 9.375 MED OPEN (MISCELLANEOUS) ×2 IMPLANT
COVER SURGICAL LIGHT HANDLE (MISCELLANEOUS) ×3 IMPLANT
COVER WAND RF STERILE (DRAPES) ×1 IMPLANT
DERMABOND ADVANCED (GAUZE/BANDAGES/DRESSINGS) ×2
DERMABOND ADVANCED .7 DNX12 (GAUZE/BANDAGES/DRESSINGS) ×3 IMPLANT
DRAIN CHANNEL 19F RND (DRAIN) ×3 IMPLANT
DRAPE HALF SHEET 40X57 (DRAPES) ×7 IMPLANT
DRAPE ORTHO SPLIT 77X108 STRL (DRAPES) ×6
DRAPE SURG 17X23 STRL (DRAPES) ×12 IMPLANT
DRAPE SURG ORHT 6 SPLT 77X108 (DRAPES) ×4 IMPLANT
DRAPE WARM FLUID 44X44 (DRAPES) ×3 IMPLANT
DRSG PAD ABDOMINAL 8X10 ST (GAUZE/BANDAGES/DRESSINGS) ×4 IMPLANT
ELECT BLADE 4.0 EZ CLEAN MEGAD (MISCELLANEOUS) ×3
ELECT REM PT RETURN 9FT ADLT (ELECTROSURGICAL) ×6
ELECTRODE BLDE 4.0 EZ CLN MEGD (MISCELLANEOUS) ×1 IMPLANT
ELECTRODE REM PT RTRN 9FT ADLT (ELECTROSURGICAL) ×3 IMPLANT
EVACUATOR SILICONE 100CC (DRAIN) ×5 IMPLANT
GAUZE SPONGE 4X4 12PLY STRL (GAUZE/BANDAGES/DRESSINGS) ×3 IMPLANT
GLOVE BIO SURGEON STRL SZ 6.5 (GLOVE) ×6 IMPLANT
GLOVE SURG SYN 7.5  E (GLOVE) ×1
GLOVE SURG SYN 7.5 E (GLOVE) ×2 IMPLANT
GLOVE SURG SYN 7.5 PF PI (GLOVE) IMPLANT
GOWN STRL REUS W/ TWL LRG LVL3 (GOWN DISPOSABLE) ×4 IMPLANT
GOWN STRL REUS W/TWL LRG LVL3 (GOWN DISPOSABLE) ×6
GRAFT FLEX HD 6X16 PLIABLE (Tissue) ×4 IMPLANT
IMPL EXPANDER BREAST 535CC (Breast) IMPLANT
IMPLANT BREAST 535CC (Breast) ×2 IMPLANT
IMPLANT EXPANDER BREAST 535CC (Breast) ×4 IMPLANT
KIT BASIN OR (CUSTOM PROCEDURE TRAY) ×5 IMPLANT
KIT TURNOVER KIT B (KITS) ×3 IMPLANT
MARKER SKIN DUAL TIP RULER LAB (MISCELLANEOUS) ×2 IMPLANT
NS IRRIG 1000ML POUR BTL (IV SOLUTION) ×6 IMPLANT
PACK GENERAL/GYN (CUSTOM PROCEDURE TRAY) ×4 IMPLANT
PAD ARMBOARD 7.5X6 YLW CONV (MISCELLANEOUS) ×3 IMPLANT
PIN SAFETY STERILE (MISCELLANEOUS) ×3 IMPLANT
SET ASEPTIC TRANSFER (MISCELLANEOUS) ×2 IMPLANT
SET WALTER ACTIVATION W/DRAPE (SET/KITS/TRAYS/PACK) ×4 IMPLANT
SUT MNCRL AB 4-0 PS2 18 (SUTURE) ×6 IMPLANT
SUT MON AB 3-0 SH 27 (SUTURE) ×6
SUT MON AB 3-0 SH27 (SUTURE) ×4 IMPLANT
SUT MON AB 5-0 PS2 18 (SUTURE) ×7 IMPLANT
SUT PDS AB 2-0 CT1 27 (SUTURE) ×16 IMPLANT
SUT SILK 3 0 SH 30 (SUTURE) ×2 IMPLANT
SUT SILK 4 0 PS 2 (SUTURE) ×3 IMPLANT
SUT VIC AB 3-0 SH 18 (SUTURE) IMPLANT
TOWEL GREEN STERILE (TOWEL DISPOSABLE) ×3 IMPLANT
TOWEL GREEN STERILE FF (TOWEL DISPOSABLE) ×3 IMPLANT
TRAY FOLEY MTR SLVR 14FR STAT (SET/KITS/TRAYS/PACK) IMPLANT
TRAY FOLEY MTR SLVR 16FR STAT (SET/KITS/TRAYS/PACK) ×2 IMPLANT

## 2019-05-14 NOTE — Interval H&P Note (Signed)
History and Physical Interval Note:  05/14/2019 9:15 AM  Alice Davis  has presented today for surgery, with the diagnosis of LEFT BREAST CANCER, RIGHT BREAST PASH.  The various methods of treatment have been discussed with the patient and family. After consideration of risks, benefits and other options for treatment, the patient has consented to  Procedure(s) with comments: BILATERAL NIPPLE SPARING MASTECTOMIES WITH LEFT SENTINEL LYMPH NODE BIOPSY (Bilateral) - PEC IMMEDIATE BREAST RECONSTRUCTION WITH PLACEMENT OF TISSUE EXPANDER AND FLEX HD (ACELLULAR HYDRATED DERMIS) (Bilateral) as a surgical intervention.  The patient's history has been reviewed, patient examined, no change in status, stable for surgery.  I have reviewed the patient's chart and labs.  Questions were answered to the patient's satisfaction.     Loel Lofty Liela Rylee

## 2019-05-14 NOTE — Discharge Instructions (Signed)
INSTRUCTIONS FOR AFTER BREAST SURGERY   You are getting ready to undergo breast surgery.  You will likely have some questions about what to expect following your operation.  The following information will help you and your family understand what to expect when you are discharged from the hospital.  Following these guidelines will help ensure a smooth recovery and reduce risks of complications.   Postoperative instructions include information on: diet, wound care, medications and physical activity.  AFTER SURGERY Expect to go home after the procedure.  In some cases, you may need to spend one night in the hospital for observation.  DIET Breast surgery does not require a specific diet.  However, the healthier you eat the better your body can start healing. It is important to increasing your protein intake.  This means limiting the foods with sugar and carbohydrates.  Focus on vegetables and some meat.  If you have any liposuction during your procedure be sure to drink water.  If your urine is bright yellow, then it is concentrated, and you need to drink more water.  As a general rule after surgery, you should have 8 ounces of water every hour while awake.  If you find you are persistently nauseated or unable to take in liquids let us know.  NO TOBACCO USE or EXPOSURE.  This will slow your healing process and increase the risk of a wound.  WOUND CARE If you don't have a drain:  You can shower the day after surgery. Use fragrance free soap.  Dial, [place] and [place] are usually mild on the skin. If you have a drain: You can shower five days after surgery.  Clean with baby wipes until the drain is removed.    If you have steri-strips / tape directly attached to your skin leave them in place. It is OK to get these wet.  No baths, pools or hot tubs for two weeks. We close your incision to leave the smallest and best-looking scar. No ointment or creams on your incisions until given the go ahead.  Especially not  Neosporin (Too many skin reactions with this one).  A few weeks after surgery you can use Mederma and start massaging the scar. We ask you to wear your binder or sports bra for the first 6 weeks around the clock, including while sleeping. This provides added comfort and helps reduce the fluid accumulation at the surgery site.  ACTIVITY No heavy lifting until cleared by the doctor.  This usually means no more than a half-gallon of milk.  It is OK to walk and climb stairs. In fact, moving your legs is very important to decrease your risk of a blood clot.  It will also help keep you from getting deconditioned.  Every 1 to 2 hours get up and walk for 5 minutes. This will help with a quicker recovery back to normal.  Let pain be your guide so you don't do too much.  This is not the time for spring cleaning and don't plan on taking care of anyone else.  This time is for you to recover,  You will be more comfortable if you sleep and rest with your head elevated either with a few pillows under you or in a recliner.  No stomach sleeping for a three months.  WORK Everyone returns to work at different times. As a rough guide, most people take at least 1 - 2 weeks off prior to returning to work. If you need documentation for your job,  bring the forms to your postoperative follow up visit.  DRIVING Arrange for someone to bring you home from the hospital.  You may be able to drive a few days after surgery but not while taking any narcotics or valium.  BOWEL MOVEMENTS Constipation can occur after anesthesia and while taking pain medication.  It is important to stay ahead for your comfort.  We recommend taking Milk of Magnesia (2 tablespoons; twice a day) while taking the pain pills.  SEROMA This is fluid your body tried to put in the surgical site.  This is normal but if it creates tight skinny skin let us know.  It usually decreases in a few weeks.  MEDICATIONS and PAIN CONTROL At your preoperative visit for  you history and physical you were given the following medications: 1. An antibiotic: Start this medication when you get home and take according to the instructions on the bottle. 2. Zofran 4 mg:  This is to treat nausea and vomiting.  You can take this every 6 hours as needed and only if needed. 3. Valium 2 mg: This is for muscle tightness if you have an implant or expander. This will help relax your muscle which also helps with pain control.  This can be taken every 12 hours as needed.  Don't drive after taking this medication. 4. Norco (hydrocodone/acetaminophen) 5/325 mg:  This is only to be used after you have taken the motrin or the tylenol. Every 8 hours as needed. Over the counter Medication to take: 5. Ibuprofen (Motrin) 600 mg:  Take this every 6 hours.  If you have additional pain then take 500 mg of the tylenol every 8 hours.  Only take the Norco after you have tried these two. 6. Miralax or stool softener of choice: Take this according to the bottle if you take the [place] Call your surgeon's office if any of the following occur: . Fever 101 degrees F or greater . Excessive bleeding or fluid from the incision site. . Pain that increases over time without aid from the medications . Redness, warmth, or pus draining from incision sites . Persistent nausea or inability to take in liquids . Severe misshapen area that underwent the operation.  Here are some resources:  1. Plastic surgery website: https://www.plasticsurgery.org/for-medical-professionals/education-and-resources/publications/breast-reconstruction-magazine 2. Breast Reconstruction Awareness Campaign:  [employer]co[employer]nz 3. Plastic surgery Implant information:  https://www.plasticsurgery.org/patient-safety/breast-implant-safety

## 2019-05-14 NOTE — Anesthesia Preprocedure Evaluation (Addendum)
Anesthesia Evaluation  Patient identified by MRN, date of birth, ID band Patient awake    Reviewed: Allergy & Precautions, NPO status , Patient's Chart, lab work & pertinent test results  History of Anesthesia Complications Negative for: history of anesthetic complications  Airway Mallampati: I  TM Distance: >3 FB Neck ROM: Full    Dental  (+) Dental Advisory Given, Teeth Intact   Pulmonary asthma ,    Pulmonary exam normal        Cardiovascular negative cardio ROS Normal cardiovascular exam     Neuro/Psych negative neurological ROS  negative psych ROS   GI/Hepatic negative GI ROS, Neg liver ROS,   Endo/Other  negative endocrine ROS  Renal/GU negative Renal ROS     Musculoskeletal negative musculoskeletal ROS (+)   Abdominal   Peds  Hematology negative hematology ROS (+)   Anesthesia Other Findings Covid neg 1/4   Reproductive/Obstetrics  Breast cancer                             Anesthesia Physical Anesthesia Plan  ASA: II  Anesthesia Plan: General   Post-op Pain Management:  Regional for Post-op pain   Induction: Intravenous  PONV Risk Score and Plan: 3 and Treatment may vary due to age or medical condition, Ondansetron, Dexamethasone, Midazolam and Scopolamine patch - Pre-op  Airway Management Planned: Oral ETT  Additional Equipment: None  Intra-op Plan:   Post-operative Plan: Extubation in OR  Informed Consent: I have reviewed the patients History and Physical, chart, labs and discussed the procedure including the risks, benefits and alternatives for the proposed anesthesia with the patient or authorized representative who has indicated his/her understanding and acceptance.     Dental advisory given  Plan Discussed with: CRNA and Anesthesiologist  Anesthesia Plan Comments:       Anesthesia Quick Evaluation

## 2019-05-14 NOTE — Anesthesia Procedure Notes (Signed)
Anesthesia Regional Block: Pectoralis block   Pre-Anesthetic Checklist: ,, timeout performed, Correct Patient, Correct Site, Correct Laterality, Correct Procedure, Correct Position, site marked, Risks and benefits discussed,  Surgical consent,  Pre-op evaluation,  At surgeon's request and post-op pain management  Laterality: Right  Prep: chloraprep       Needles:  Injection technique: Single-shot  Needle Type: Echogenic Needle     Needle Length: 10cm  Needle Gauge: 21     Additional Needles:   Narrative:  Start time: 05/14/2019 8:31 AM End time: 05/14/2019 8:36 AM Injection made incrementally with aspirations every 5 mL.  Performed by: Personally  Anesthesiologist: Audry Pili, MD  Additional Notes: No pain on injection. No increased resistance to injection. Injection made in 5cc increments. Good needle visualization. Patient tolerated the procedure well.

## 2019-05-14 NOTE — Op Note (Signed)
Op report    DATE OF OPERATION:  05/14/2019  LOCATION: [facility]  SURGICAL DIVISION: Plastic Surgery  PREOPERATIVE DIAGNOSES:  1. Left Breast cancer.    POSTOPERATIVE DIAGNOSES:  1. Left Breast cancer.   PROCEDURE:  1. Bilateral immediate breast reconstruction with placement of Acellular Dermal Matrix and tissue expanders.  SURGEON: Lakeysha Slutsky Sanger Kiaraliz Rafuse, DO  ASSISTANT: Phoebe Sharps, PA  ANESTHESIA:  General.   COMPLICATIONS: None.   IMPLANTS: Left - Mentor 535 cc. Ref [id number].  Serial Number [id number], 150 cc of injectable saline placed in the expander. Right - Mentor 535 cc. Ref [id number].  Serial Number [id number], 150 cc of injectable saline placed in the expander. Acellular Dermal Matrix 6 x 16 cm Flex HD  INDICATIONS FOR PROCEDURE:  The patient, Alice Davis, is a 49 y.o. female born on Oct 15, 1970, is here for immediate first stage breast reconstruction with placement of bilateral tissue expander and Acellular dermal matrix. MRN: [id number]  CONSENT:  Informed consent was obtained directly from the patient. Risks, benefits and alternatives were fully discussed. Specific risks including but not limited to bleeding, infection, hematoma, seroma, scarring, pain, implant infection, implant extrusion, capsular contracture, asymmetry, wound healing problems, and need for further surgery were all discussed. The patient did have an ample opportunity to have her questions answered to her satisfaction.   DESCRIPTION OF PROCEDURE:  The patient was taken to the operating room by the general surgery team. SCDs were placed and IV antibiotics were given. The patient's chest was prepped and draped in a sterile fashion. A time out was performed and the implants to be used were identified.  Bilateral mastectomies were performed.  Once the general surgery team had completed their portion of the case the patient was rendered to the plastic and reconstructive  surgery team.  Right:  The pectoralis major muscle was lifted from the chest wall with release of the lateral edge and lateral inframammary fold.  The pocket was irrigated with antibiotic solution and hemostasis was achieved with electrocautery.  The ADM was then prepared according to the manufacture guidelines and slits placed to help with postoperative fluid management.  The ADM was then sutured to the inferior and lateral edge of the inframammary fold with 2-0 PDS starting with an interrupted stitch and then a running stitch.  The lateral portion was sutured to with interrupted sutures after the expander was placed.  The expander was prepared according to the manufacture guidelines, the air evacuated and then it was placed under the ADM and pectoralis major muscle.  The inferior and lateral tabs were used to secure the expander to the chest wall with 2-0 PDS.  The drain was placed at the inframammary fold over the ADM and secured to the skin with 3-0 Silk.  The deep layers were closed with 3-0 Monocryl followed by 4-0 Monocryl.  The skin was closed with 5-0 Monocryl.   Left:  Right:  The pectoralis major muscle was lifted from the chest wall with release of the lateral edge and lateral inframammary fold.  The pocket was irrigated with antibiotic solution and hemostasis was achieved with electrocautery.  The ADM was then prepared according to the manufacture guidelines and slits placed to help with postoperative fluid management.  The ADM was then sutured to the inferior and lateral edge of the inframammary fold with 2-0 PDS starting with an interrupted stitch and then a running stitch.  The lateral portion was sutured to with interrupted sutures after the expander was  placed.  The expander was prepared according to the manufacture guidelines, the air evacuated and then it was placed under the ADM and pectoralis major muscle.  The inferior and lateral tabs were used to secure the expander to the chest wall  with 2-0 PDS.  The drain was placed at the inframammary fold over the ADM and secured to the skin with 3-0 Silk.    The deep layers were closed with 3-0 Monocryl followed by 4-0 Monocryl.  The skin was closed with 5-0 Monocryl and then dermabond was applied.  The ABDs and breast binder were placed.  The patient tolerated the procedure well and there were no complications.  The patient was allowed to wake from anesthesia and taken to the recovery room in satisfactory condition.   The advanced practice practitioner (APP) assisted throughout the case.  The APP was essential in retraction and counter traction when needed to make the case progress smoothly.  This retraction and assistance made it possible to see the tissue plans for the procedure.  The assistance was needed for blood control, tissue re-approximation and assisted with closure of the incision site.  The [place] was signed into law in 2016 which includes the topic of electronic health records.  This provides immediate access to information in MyChart.  This includes consultation notes, operative notes, office notes, lab results and pathology reports.  If you have any questions about what you read please let us know at your next visit or call us at the office.  We are right here with you.

## 2019-05-14 NOTE — Op Note (Signed)
05/14/2019  1:11 PM  PATIENT:  Alice Davis  49 y.o. female  PRE-OPERATIVE DIAGNOSIS:  LEFT BREAST CANCER, RIGHT BREAST PASH  POST-OPERATIVE DIAGNOSIS:  LEFT BREAST CANCER, RIGHT BREAST PASH  PROCEDURE:  Procedure(s) with comments: BILATERAL NIPPLE SPARING MASTECTOMIES WITH LEFT SENTINEL LYMPH NODE BIOPSY (Bilateral) - PEC   SURGEON:  Surgeon(s) and Role: Panel 1:    * [name], MD - Primary   PHYSICIAN ASSISTANT:   ASSISTANTS: Pryor Curia, RNFA   ANESTHESIA:   general  EBL:  50 mL   BLOOD ADMINISTERED:none  DRAINS: none   LOCAL MEDICATIONS USED:  NONE  SPECIMEN:  Source of Specimen:  right mastectomy, left mastectomy, sentinel nodes x 2, frozen sections of retronipple tissue bilaterally, additional lower outer retroareolar tissue from left breast  DISPOSITION OF SPECIMEN:  PATHOLOGY  COUNTS:  YES  TOURNIQUET:  * No tourniquets in log *  DICTATION: .Dragon Dictation   After informed consent was obtained the patient was brought to the operating room and placed in the supine position on the operating table.  After adequate induction of general anesthesia the patient's bilateral chest, breast, and axillary areas were prepped with ChloraPrep, allowed to dry, and draped in usual sterile manner.  Earlier in the day the patient underwent injection of 1 mCi of technetium sulfur colloid in the subareolar position on the left.  An appropriate timeout was performed.  The neoprobe was set to technetium and there was a good signal in the left axilla.  Attention was first turned to the right breast.  An inframammary fold incision was made with a 10 blade knife.  The incision was carried through the skin and subcutaneous tissue sharply with the PlasmaBlade until the chest wall was encountered.  The dissection was then carried out between the chest wall muscle and the breast tissue until the entire breast was mobilized off the chest wall.  Dissection was then carried out  anteriorly between the breast tissue and the subcutaneous fat.  This dissection was carried out superiorly until the dissection reached the posterior dissection plane.  The position of the nipple was marked with a stitch.  Once this was accomplished and the 2 dissections were joined then the entire breast was removed.  It was oriented with the appropriate paint colors.  A small frozen section from the tissue behind the nipple was removed with a 15 blade knife and sent for frozen section which was negative.  Hemostasis was achieved using the PlasmaBlade.  The cavity was then packed with a moistened lap sponge.  The skin flap was very uniform and healthy appearing.  Attention was then turned to the left breast.  A similar inframammary fold incision was made with a 10 blade knife.  The incision was carried through the skin and subcutaneous tissue sharply with the PlasmaBlade until the chest wall was encountered.  Dissection was then carried out superiorly between the breast tissue and the muscle of the chest wall until the entire breast was mobilized off the chest wall.  Dissection was then carried out between the breast tissue and the subcutaneous fat superiorly.  This dissection was carried out until the dissection met the posterior dissection at the very upper pole of the breast.  The position of the nipple was marked with a stitch.  Once the 2 dissection planes reached each other than the entire breast was removed from the patient.  The breast was then oriented with the appropriate paint colors.  A small frozen section from  the tissue behind the nipple was removed with a 15 blade knife and sent for frozen section which was negative.  An additional piece of tissue from the lower outer retroareolar area was also removed and sent separately for pathologic evaluation.  This was where the skin flap was the thickest and also the area that I was most worried about being close from a cancer standpoint.  Once this was  accomplished the skin flap was very uniform and appeared healthy.  Hemostasis was achieved using the PlasmaBlade.  The neoprobe was then used to identify the increased area of radioactivity in the left axilla.  Dissection was carried towards the area of radioactivity sharply with the plasma blade.  Once the deep left axillary space was entered then the dissection was carried towards the radioactivity by blunt tonsil dissection.  I was able to identify 2 hot lymph nodes.  Each of these were excised sharply with the plasma blade and the surrounding vessels and lymphatics were controlled with clips.  Ex vivo counts on these 2 nodes were between 200 and 2000.  No other hot or palpable nodes were identified in the left axilla.  The patient tolerated the procedure well.  At the end of the case all needle sponge and instrument counts were correct.  At this point the operation was turned over to Dr. Marla Roe for the reconstruction.  Her portion will be dictated separately.  PLAN OF CARE: Admit for overnight observation  PATIENT DISPOSITION:  PACU - hemodynamically stable.   Delay start of Pharmacological VTE agent (>24hrs) due to surgical blood loss or risk of bleeding: not applicable

## 2019-05-14 NOTE — Anesthesia Procedure Notes (Signed)
Anesthesia Regional Block: Pectoralis block   Pre-Anesthetic Checklist: ,, timeout performed, Correct Patient, Correct Site, Correct Laterality, Correct Procedure, Correct Position, site marked, Risks and benefits discussed,  Surgical consent,  Pre-op evaluation,  At surgeon's request and post-op pain management  Laterality: Left  Prep: chloraprep       Needles:  Injection technique: Single-shot  Needle Type: Echogenic Needle     Needle Length: 10cm  Needle Gauge: 21     Additional Needles:   Narrative:  Start time: 05/14/2019 8:26 AM End time: 05/14/2019 8:30 AM Injection made incrementally with aspirations every 5 mL.  Performed by: Personally  Anesthesiologist: Audry Pili, MD  Additional Notes: No pain on injection. No increased resistance to injection. Injection made in 5cc increments. Good needle visualization. Patient tolerated the procedure well.

## 2019-05-14 NOTE — H&P (Signed)
Alice Davis  Location: [facility] Patient #: [id number] DOB: [birth year] Married / Language: English / Race: White Female   History of Present Illness The patient is a 49 year old female who presents with breast cancer. We're asked to see the patient in consultation by Dr. Arnetha Gula to evaluate her for a new left breast cancer. The patient is a 49 year old white female who recently went for a routine screening mammogram. At that time she was found to have 2 small masses of 6 and 9 mm in the lower inner quadrant of the left breast. She also had 1 abnormal looking lymph node that was biopsied and came back negative. 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 The tumor markers have not been reported yet. She is otherwise in good health. She does not smoke. She does take birth control pills.   Past Surgical History Breast Biopsy  Left. Oral Surgery   Diagnostic Studies History  Colonoscopy  never Mammogram  within last year Pap Smear  1-5 years ago  Allergies No Known Drug Allergies    Medication History  Levonorgest-Eth Estrad 91-Day (0.15-0.03MG  Tablet, Oral) Active. ZyrTEC (10MG  Tablet, Oral) Active. Medications Reconciled  Social History  Alcohol use  Occasional alcohol use. No caffeine use  No drug use  Tobacco use  Never smoker.  Family History Arthritis  Father. Breast Cancer  Mother. Diabetes Mellitus  Father. Hypertension  Father. Melanoma  Father.  Pregnancy / Birth History  Age at menarche  38 years. Contraceptive History  Oral contraceptives. Gravida  2 Length (months) of breastfeeding  12-24 Maternal age  70-25 Para  2 Regular periods   Other Problems  Breast Cancer     Review of Systems  General Present- Fatigue, Night Sweats and Weight Gain. Not Present- Appetite Loss, Chills, Fever and Weight Loss. Skin Present- Rash. Not Present- Change in Wart/Mole, Dryness,  Hives, Jaundice, New Lesions, Non-Healing Wounds and Ulcer. HEENT Present- Seasonal Allergies and Wears glasses/contact lenses. Not Present- Earache, Hearing Loss, Hoarseness, Nose Bleed, Oral Ulcers, Ringing in the Ears, Sinus Pain, Sore Throat, Visual Disturbances and Yellow Eyes. Breast Not Present- Breast Mass, Breast Pain, Nipple Discharge and Skin Changes. Cardiovascular Not Present- Chest Pain, Difficulty Breathing Lying Down, Leg Cramps, Palpitations, Rapid Heart Rate, Shortness of Breath and Swelling of Extremities. Gastrointestinal Not Present- Abdominal Pain, Bloating, Bloody Stool, Change in Bowel Habits, Chronic diarrhea, Constipation, Difficulty Swallowing, Excessive gas, Gets full quickly at meals, Hemorrhoids, Indigestion, Nausea, Rectal Pain and Vomiting. Female Genitourinary Not Present- Frequency, Nocturia, Painful Urination, Pelvic Pain and Urgency. Musculoskeletal Not Present- Back Pain, Joint Pain, Joint Stiffness, Muscle Pain, Muscle Weakness and Swelling of Extremities. Neurological Present- Headaches. Not Present- Decreased Memory, Fainting, Numbness, Seizures, Tingling, Tremor, Trouble walking and Weakness. Psychiatric Not Present- Anxiety, Bipolar, Change in Sleep Pattern, Depression, Fearful and Frequent crying. Endocrine Not Present- Cold Intolerance, Excessive Hunger, Hair Changes, Heat Intolerance, Hot flashes and New Diabetes. Hematology Not Present- Blood Thinners, Easy Bruising, Excessive bleeding, Gland problems, HIV and Persistent Infections.  Vitals  Weight: 140.8 lb Height: 67in Body Surface Area: 1.74 m Body Mass Index: 22.05 kg/m  Temp.: 98.41F  Pulse: 102 (Regular)  BP: 115/78 (Sitting, Left Arm, Standard)       Physical Exam General Mental Status-Alert. General Appearance-Consistent with stated age. Hydration-Well hydrated. Voice-Normal.  Head and Neck Head-normocephalic, atraumatic with no lesions or palpable  masses. Trachea-midline. Thyroid Gland Characteristics - normal size and consistency.  Eye Eyeball - Bilateral-Extraocular movements intact. Sclera/Conjunctiva - Bilateral-No scleral icterus.  Chest and Lung Exam Chest and lung exam reveals -quiet, even and easy respiratory effort with no use of accessory muscles and on auscultation, normal breath sounds, no adventitious sounds and normal vocal resonance. Inspection Chest Wall - Normal. Back - normal.  Breast Note: There is some bruising in the lower inner quadrant of the left breast but otherwise there is no palpable mass in either breast. There is no palpable axillary, supraclavicular, or cervical lymphadenopathy.   Cardiovascular Cardiovascular examination reveals -normal heart sounds, regular rate and rhythm with no murmurs and normal pedal pulses bilaterally.  Abdomen Inspection Inspection of the abdomen reveals - No Hernias. Skin - Scar - no surgical scars. Palpation/Percussion Palpation and Percussion of the abdomen reveal - Soft, Non Tender, No Rebound tenderness, No Rigidity (guarding) and No hepatosplenomegaly. Auscultation Auscultation of the abdomen reveals - Bowel sounds normal.  Neurologic Neurologic evaluation reveals -alert and oriented x 3 with no impairment of recent or remote memory. Mental Status-Normal.  Musculoskeletal Normal Exam - Left-Upper Extremity Strength Normal and Lower Extremity Strength Normal. Normal Exam - Right-Upper Extremity Strength Normal and Lower Extremity Strength Normal.  Lymphatic Head & Neck  General Head & Neck Lymphatics: Bilateral - Description - Normal. Axillary  General Axillary Region: Bilateral - Description - Normal. Tenderness - Non Tender. Femoral & Inguinal  Generalized Femoral & Inguinal Lymphatics: Bilateral - Description - Normal. Tenderness - Non Tender.    Assessment & Plan MALIGNANT NEOPLASM OF LOWER-INNER QUADRANT OF LEFT FEMALE  BREAST, UNSPECIFIED ESTROGEN RECEPTOR STATUS (C50.312) Impression: The patient appears to have 2 small cancers in the lower inner quadrant of the left breast with clinically negative nodes. I have talked to her in detail about the different options for treatment and at this point she favors breast conservation. She would also be a good candidate for sentinel node biopsy. Given that there seemed to be 2 areas of cancer in the breast I would favor also evaluating her with MRI prior to scheduling surgery. I will go ahead and make a referral to medical and radiation oncology. We also do not have the rest of her tumor markers reported yet. I will call her with the results of the pathology and we will finalize our plan moving forward. She has also been counseled to stop taking the birth control pills today. Current Plans Referred to Oncology, for evaluation and follow up (Oncology). Routine. Pt Education - Breast Cancer: discussed with patient and provided information. MRI BREAST BILATERAL DIAGNOSTIC [id number])  MRI showed much larger area on left and large area of [place] on right. She has elected for bilateral nsm with sentinel node biopsy on left.

## 2019-05-14 NOTE — Anesthesia Procedure Notes (Signed)
Procedure Name: Intubation Date/Time: 05/14/2019 9:39 AM Performed by: Kathryne Hitch, CRNA Pre-anesthesia Checklist: Patient identified, Emergency Drugs available, Suction available and Patient being monitored Patient Re-evaluated:Patient Re-evaluated prior to induction Oxygen Delivery Method: Circle system utilized Preoxygenation: Pre-oxygenation with 100% oxygen Induction Type: IV induction Ventilation: Mask ventilation without difficulty Laryngoscope Size: Miller and 3 Grade View: Grade I Tube type: Oral Tube size: 7.0 mm Number of attempts: 1 Airway Equipment and Method: Stylet and Oral airway Placement Confirmation: ETT inserted through vocal cords under direct vision,  positive ETCO2 and breath sounds checked- equal and bilateral Secured at: 23 cm Tube secured with: Tape Dental Injury: Teeth and Oropharynx as per pre-operative assessment

## 2019-05-14 NOTE — Transfer of Care (Signed)
Immediate Anesthesia Transfer of Care Note  Patient: Alice Davis  Procedure(s) Performed: BILATERAL NIPPLE SPARING MASTECTOMIES WITH LEFT SENTINEL LYMPH NODE BIOPSY (Bilateral Breast) IMMEDIATE BREAST RECONSTRUCTION WITH PLACEMENT OF TISSUE EXPANDER AND FLEX HD (ACELLULAR HYDRATED DERMIS) (Bilateral )  Patient Location: PACU  Anesthesia Type:General and Regional  Level of Consciousness: drowsy and patient cooperative  Airway & Oxygen Therapy: Patient Spontanous Breathing and Patient connected to face mask oxygen  Post-op Assessment: Report given to RN and Post -op Vital signs reviewed and stable  Post vital signs: Reviewed and stable  Last Vitals:  Vitals Value Taken Time  BP 134/84 05/14/19 1351  Temp    Pulse 93 05/14/19 1353  Resp 24 05/14/19 1353  SpO2 98 % 05/14/19 1353  Vitals shown include unvalidated device data.  Last Pain:  Vitals:   05/14/19 0828  TempSrc:   PainSc: 0-No pain      Patients Stated Pain Goal: 3 ([id number])  Complications: No apparent anesthesia complications

## 2019-05-15 DIAGNOSIS — C50312 Malignant neoplasm of lower-inner quadrant of left female breast: Secondary | ICD-10-CM | POA: Diagnosis not present

## 2019-05-15 NOTE — Anesthesia Postprocedure Evaluation (Signed)
Anesthesia Post Note  Patient: Oncologist  Procedure(s) Performed: BILATERAL NIPPLE SPARING MASTECTOMIES WITH LEFT SENTINEL LYMPH NODE BIOPSY (Bilateral Breast) IMMEDIATE BREAST RECONSTRUCTION WITH PLACEMENT OF TISSUE EXPANDER AND FLEX HD (ACELLULAR HYDRATED DERMIS) (Bilateral )     Patient location during evaluation: PACU Anesthesia Type: General Level of consciousness: awake and alert Pain management: pain level controlled Vital Signs Assessment: post-procedure vital signs reviewed and stable Respiratory status: spontaneous breathing, nonlabored ventilation and respiratory function stable Cardiovascular status: blood pressure returned to baseline and stable Postop Assessment: no apparent nausea or vomiting Anesthetic complications: no    Last Vitals:  Vitals:   05/15/19 0201 05/15/19 0534  BP: 109/70 120/65  Pulse: 73 97  Resp: 18 16  Temp: 37.1 C 37.4 C  SpO2:  97%    Last Pain:  Vitals:   05/15/19 0534  TempSrc: Oral  PainSc:                  Audry Pili

## 2019-05-15 NOTE — Progress Notes (Signed)
Patient ID: [name], female   DOB: 02/07/1971, 49 y.o.   MRN: [id number]  1 Day Post-Op  Subjective: No complaints  Objective: Vital signs in last 24 hours: Temp:  [98.1 F (36.7 C)-99.4 F (37.4 C)] 99.4 F (37.4 C) (01/08 0534) Pulse Rate:  [59-97] 97 (01/08 0534) Resp:  [15-24] 16 (01/08 0534) BP: (109-134)/(65-84) 120/65 (01/08 0534) SpO2:  [93 %-100 %] 97 % (01/08 0534) Last BM Date: 05/13/19  Intake/Output from previous day: 01/07 0701 - 01/08 0700 In: 2695.4 [P.O.:300; I.V.:2295.4; IV Piggyback:100] Out: 2470 [Urine:2230; Drains:140; Blood:100] Intake/Output this shift: Total I/O In: 480 [P.O.:480] Out: -   General appearance: alert and cooperative Resp: clear to auscultation bilaterally Chest wall: skin flaps and nipple look healthy Cardio: regular rate and rhythm GI: soft, non-tender; bowel sounds normal; no masses,  no organomegaly  Lab Results:  No results for input(s): WBC, HGB, HCT, PLT in the last 72 hours. BMET No results for input(s): NA, K, CL, CO2, GLUCOSE, BUN, CREATININE, CALCIUM in the last 72 hours. PT/INR No results for input(s): LABPROT, INR in the last 72 hours. ABG No results for input(s): PHART, HCO3 in the last 72 hours.  Invalid input(s): PCO2, PO2  Studies/Results: NM Sentinel Node Inj-No Rpt (Breast)  Result Date: 05/14/2019 Sulfur colloid was injected by the nuclear medicine technologist for melanoma sentinel node.    Anti-infectives: Anti-infectives (From admission, onward)   Start     Dose/Rate Route Frequency Ordered Stop   05/14/19 1800  ceFAZolin (ANCEF) IVPB 2g/100 mL premix     2 g 200 mL/hr over 30 Minutes Intravenous Every 8 hours 05/14/19 1341     05/14/19 1003  polymyxin B 500,000 Units, bacitracin 50,000 Units in sodium chloride 0.9 % 500 mL irrigation  Status:  Discontinued       As needed 05/14/19 1003 05/14/19 1352   05/14/19 0730  ceFAZolin (ANCEF) IVPB 2g/100 mL premix     2 g 200 mL/hr over 30 Minutes  Intravenous On call to O.R. 05/14/19 0721 05/14/19 0955      Assessment/Plan: s/p Procedure(s): BILATERAL NIPPLE SPARING MASTECTOMIES WITH LEFT SENTINEL LYMPH NODE BIOPSY IMMEDIATE BREAST RECONSTRUCTION WITH PLACEMENT OF TISSUE EXPANDER AND FLEX HD (ACELLULAR HYDRATED DERMIS) Advance diet Discharge  LOS: 0 days    Autumn Messing III 05/15/2019

## 2019-05-15 NOTE — Discharge Summary (Signed)
Physician Discharge Summary  Patient ID: Alice Davis MRN: [id number] DOB/AGE: 07/04/1970 49 y.o.  Admit date: 05/14/2019 Discharge date: 05/15/2019  Admission Diagnoses:  Discharge Diagnoses:  Active Problems:   Breast cancer [facility])   Discharged Condition: good  Hospital Course: Patient is a 49 yr-old female who underwent bilateral nipple sparing mastectomies with left sentinel lymph node biopsy with Dr. Marlou Starks and placement of tissue expanders by Dr. Marla Roe on 05/14/2019.  No overnight events.  Pain managed with Tylenol and ibuprofen.  Patient is feeling well sitting up in bed eating breakfast.  Incisions are C/D/I.  JP drains contain small amount of serous sanguinous fluid. No signs of redness, dehiscence, drainage, or seroma/hematoma.  Patient states she is a little more sore on the left side.   Consults: None  Significant Diagnostic Studies: Specimens sent to pathology during surgery.   Treatments: surgery:   Discharge Exam: Blood pressure 120/65, pulse 97, temperature 99.4 F (37.4 C), temperature source Oral, resp. rate 16, height 5\' 7"  (1.702 m), weight 62.6 kg, last menstrual period 04/19/2019, SpO2 97 %. General appearance: alert, cooperative and no distress Head: Normocephalic, without obvious abnormality, atraumatic Eyes: EOMs intact Resp: normal effort, using incentive spirometer Incision/Wound: Breast incisions are c/d/i bilaterally; no signs of drainage, redness, dehiscence, or seroma/hematoma; JP drains in place bilaterally with small amount of serosanguinous fluid in bulbs. Wearing breast binder.  Disposition: Discharge disposition: 01-Home or Self Care       Discharge Instructions    Call MD for:  difficulty breathing, headache or visual disturbances   Complete by: As directed    Call MD for:  extreme fatigue   Complete by: As directed    Call MD for:  hives   Complete by: As directed    Call MD for:  persistant dizziness or light-headedness   Complete  by: As directed    Call MD for:  persistant nausea and vomiting   Complete by: As directed    Call MD for:  redness, tenderness, or signs of infection (pain, swelling, redness, odor or green/yellow discharge around incision site)   Complete by: As directed    Call MD for:  severe uncontrolled pain   Complete by: As directed    Call MD for:  temperature >100.4   Complete by: As directed    Diet - low sodium heart healthy   Complete by: As directed    Increase activity slowly   Complete by: As directed      Allergies as of 05/15/2019      Reactions   Latex Itching, Rash      Medication List    TAKE these medications   albuterol 108 (90 Base) MCG/ACT inhaler Commonly known as: VENTOLIN HFA Inhale 2 puffs into the lungs every 6 (six) hours as needed for wheezing or shortness of breath.   cetirizine 10 MG tablet Commonly known as: ZYRTEC Take 10 mg by mouth daily.   cyclobenzaprine 5 MG tablet Commonly known as: FLEXERIL Take 1 tablet (5 mg total) by mouth 2 (two) times daily as needed for up to 20 doses for muscle spasms.   ELDERBERRY PO Take 15 mLs by mouth daily.   MULTIVITAMIN ADULT EXTRA C PO Take 1 tablet by mouth daily.   ondansetron 4 MG tablet Commonly known as: Zofran Take 1 tablet (4 mg total) by mouth every 8 (eight) hours as needed for nausea or vomiting.      Follow-up Information    Dillingham, Loel Lofty, DO Today.  Specialty: Plastic Surgery Contact information: [address] [place] [address] [phone]        Autumn Messing III, MD In 2 weeks.   Specialty: General Surgery Contact information: [address] [place] [address] [phone]          Dr. Lyndee Leo [facility] [address] [place], [place]  [id number]   Signed: Threasa Heads 05/15/2019, 7:16 AM

## 2019-05-15 NOTE — Progress Notes (Signed)
  Discharge instructions, including when to call the MD., pain management, and drain care reviewed with patient and patient's husband. Questions answered. Patient able to repeat when to call the M.D. and plan for f/u appointment. RN demonstrated JP drain care and emptying. Printed AVS and drain education sheets given to patient. Patient discharged to home. Accompanied by husband. Declined wheelchair Preferred to walk out.

## 2019-05-18 ENCOUNTER — Encounter: Payer: Self-pay | Admitting: *Deleted

## 2019-05-18 ENCOUNTER — Other Ambulatory Visit: Payer: Self-pay

## 2019-05-19 ENCOUNTER — Encounter: Payer: Self-pay | Admitting: *Deleted

## 2019-05-20 NOTE — Progress Notes (Signed)
Patient Care Team: [name], MD as PCP - General (Family Medicine) Mauro Kaufmann, RN as Oncology Nurse Navigator Rockwell Germany, RN as Oncology Nurse Navigator  DIAGNOSIS:    ICD-10-CM   1. Malignant neoplasm of lower-inner quadrant of left breast in female, estrogen receptor positive ([place])  C50.312    Z17.0     SUMMARY OF ONCOLOGIC HISTORY: Oncology History  Malignant neoplasm of lower-inner quadrant of left breast in female, estrogen receptor positive ([place])  03/13/2019 Initial Diagnosis   Screening detected left breast abnormalities, 0.6 cm at 8:30 position and 0.9 cm at 8 o'clock position with 1 abnormal left axillary lymph node.  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%, PR 90%, HER-2 -1+, Ki-67 20%; biopsy 8:30 position: ER 80%, PR 80%, Ki-67 5%, HER-2 negative; lymph node biopsy negative   03/31/2019 Cancer Staging   Staging form: Breast, AJCC 8th Edition - Clinical stage from 03/31/2019: Stage IA (cT1b, cN0, cM0(i+), G2, ER+, PR+, HER2-) - Signed by [name], MD on 03/31/2019   05/14/2019 Surgery   Bilateral mastectomies: Left mastectomy: Multifocal invasive lobular cancer largest focus 2.5 cm, grade 2, margins negative, 1/3 lymph node positive ER 80-95 %, PR 80-95 %, HER-2 negative, Ki-67 2-20% right mastectomy: Benign   05/21/2019 Cancer Staging   Staging form: Breast, AJCC 8th Edition - Pathologic stage from 05/21/2019: Stage IB (pT2, pN1a, cM0, G2, ER+, PR+, HER2-) - Signed by [name], MD on 05/21/2019     CHIEF COMPLIANT: Follow-up s/p mastectomy to review pathology.   INTERVAL HISTORY: Alice Davis is a 49 y.o. with above-mentioned history of left breast cancer. She underwent a left mastectomy with reconstruction with Dr. Marlou Starks and Dr. Marla Roe on 05/14/19 for which pathology showed mulitfocal invasive lobular carcinoma, grade 2, largest focus 2.5cm, clear margins, 1/3 left axillary lymph nodes positive for  metastatic carcinoma. She presents to the clinic today to review the pathology report and discuss further treatment.  She is recovering fairly well from recent surgeries.  She is not using any narcotic pain medications.  She rates her pain as 5 out of 10.  She has drains on both sides and right side when appears to be less with the left side drain is still substantial.  She has appointment to see plastic surgery and surgery is coming up soon.  ALLERGIES:  is allergic to latex.  MEDICATIONS:  Current Outpatient Medications  Medication Sig Dispense Refill  . albuterol (VENTOLIN HFA) 108 (90 Base) MCG/ACT inhaler Inhale 2 puffs into the lungs every 6 (six) hours as needed for wheezing or shortness of breath.     . cetirizine (ZYRTEC) 10 MG tablet Take 10 mg by mouth daily.     . cyclobenzaprine (FLEXERIL) 5 MG tablet Take 1 tablet (5 mg total) by mouth 2 (two) times daily as needed for up to 20 doses for muscle spasms. 20 tablet 0  . ELDERBERRY PO Take 15 mLs by mouth daily.    . Multiple Vitamins-Minerals (MULTIVITAMIN ADULT EXTRA C PO) Take 1 tablet by mouth daily.     . ondansetron (ZOFRAN) 4 MG tablet Take 1 tablet (4 mg total) by mouth every 8 (eight) hours as needed for nausea or vomiting. 20 tablet 0   No current facility-administered medications for this visit.    PHYSICAL EXAMINATION: ECOG PERFORMANCE STATUS: 1 - Symptomatic but completely ambulatory  Vitals:   05/21/19 1147  BP: 114/63  Pulse: (!) 18  Resp: Marland Kitchen)  98  Temp: 98.1 F (36.7 C)  SpO2: 100%   Filed Weights   05/21/19 1147  Weight: 144 lb (65.3 kg)    LABORATORY DATA:  I have reviewed the data as listed No flowsheet data found.  Lab Results  Component Value Date   WBC 6.5 05/11/2019   HGB 14.1 05/11/2019   HCT 44.1 05/11/2019   MCV 93.8 05/11/2019   PLT 279 05/11/2019    ASSESSMENT & PLAN:  Malignant neoplasm of lower-inner quadrant of left breast in female, estrogen receptor positive  ([place]) 03/13/2019:Screening detected left breast abnormalities, 0.6 cm at 8:30 position and 0.9 cm at 8 o'clock position with 1 abnormal left axillary lymph node.  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 I discussed the margins as well as lymph node surgeries. We also discussed the final staging along with previously performed ER/PR and HER-2/neu testing.  Recommendation: 1.  MammaPrint testing to determine benefit from chemotherapy 2.  Adjuvant radiation therapy followed by 3.  Adjuvant antiestrogen therapy: We discussed tamoxifen for 10 years versus tamoxifen for 3 years followed by aromatase inhibitor for 5 years if she is menopausal.  Mammaprint counseling: MINDACT is a prospective, randomized phase III controlled trial that investigates the clinical utility of MammaPrint, when compared to standard clinical pathological criteria, with 6,693 patients enrolled from over 111 institutions. Clinical high-risk patients with a Low Risk MammaPrint result, including 48% node-positive, had 5-year distant metastasis-free survival rate in excess of 94 percent, whether randomized to receive adjuvant chemotherapy or not proving MammaPrint's ability to safely identify Low Risk patients.  Return to clinic based upon MammaPrint test results    No orders of the defined types were placed in this encounter.  The patient has a good understanding of the  overall plan. she agrees with it. she will call with any problems that may develop before the next visit here.  Total time spent: 30 mins including face to face time and time spent for planning, charting and coordination of care  [name], MD 05/21/2019  I, Cloyde Reams Dorshimer, am acting as scribe for Dr. [name].  I have reviewed the above documentation for accuracy and completeness, and I agree with the above.

## 2019-05-21 ENCOUNTER — Other Ambulatory Visit: Payer: Self-pay

## 2019-05-21 ENCOUNTER — Telehealth: Payer: Self-pay

## 2019-05-21 ENCOUNTER — Inpatient Hospital Stay: Payer: BC Managed Care – PPO | Attending: Hematology and Oncology | Admitting: Hematology and Oncology

## 2019-05-21 DIAGNOSIS — Z9012 Acquired absence of left breast and nipple: Secondary | ICD-10-CM | POA: Diagnosis not present

## 2019-05-21 DIAGNOSIS — Z79899 Other long term (current) drug therapy: Secondary | ICD-10-CM | POA: Insufficient documentation

## 2019-05-21 DIAGNOSIS — Z170 Estrogen receptor positive status [ER+]: Secondary | ICD-10-CM | POA: Diagnosis not present

## 2019-05-21 DIAGNOSIS — C50312 Malignant neoplasm of lower-inner quadrant of left female breast: Secondary | ICD-10-CM

## 2019-05-21 DIAGNOSIS — C773 Secondary and unspecified malignant neoplasm of axilla and upper limb lymph nodes: Secondary | ICD-10-CM | POA: Diagnosis not present

## 2019-05-21 LAB — SURGICAL PATHOLOGY

## 2019-05-21 NOTE — Telephone Encounter (Signed)

## 2019-05-21 NOTE — Assessment & Plan Note (Signed)
03/13/2019:Screening detected left breast abnormalities, 0.6 cm at 8:30 position and 0.9 cm at 8 o'clock position with 1 abnormal left axillary lymph node.  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 I discussed the margins as well as lymph node surgeries. We also discussed the final staging along with previously performed ER/PR and HER-2/neu testing.  Recommendation: 1.  MammaPrint testing to determine benefit from chemotherapy 2.  Adjuvant radiation therapy followed by 3.  Adjuvant antiestrogen therapy: We discussed tamoxifen for 10 years versus tamoxifen for 3 years followed by aromatase inhibitor for 5 years if she is menopausal.  Mammaprint counseling: [facility] is a prospective, randomized phase III controlled trial that investigates the clinical utility of MammaPrint, when compared to standard clinical pathological criteria, with 6,693 patients enrolled from over 111 institutions. Clinical high-risk patients with a Low Risk MammaPrint result, including 48% node-positive, had 5-year distant metastasis-free survival rate in excess of 94 percent, whether randomized to receive adjuvant chemotherapy or not proving MammaPrint's ability to safely identify Low Risk patients.  Return to clinic based upon MammaPrint test results 

## 2019-05-22 ENCOUNTER — Ambulatory Visit (INDEPENDENT_AMBULATORY_CARE_PROVIDER_SITE_OTHER): Payer: BC Managed Care – PPO | Admitting: Plastic Surgery

## 2019-05-22 ENCOUNTER — Telehealth: Payer: Self-pay | Admitting: *Deleted

## 2019-05-22 ENCOUNTER — Encounter: Payer: Self-pay | Admitting: Plastic Surgery

## 2019-05-22 DIAGNOSIS — Z9013 Acquired absence of bilateral breasts and nipples: Secondary | ICD-10-CM

## 2019-05-22 DIAGNOSIS — Z9010 Acquired absence of unspecified breast and nipple: Secondary | ICD-10-CM | POA: Insufficient documentation

## 2019-05-22 NOTE — Telephone Encounter (Signed)
Ordered mammaprint per Dr. Lindi Adie. Faxed requisition to pathology.

## 2019-05-22 NOTE — Progress Notes (Signed)
   Subjective:    Patient ID: Alice Davis, female    DOB: 1971/01/08, 49 y.o.   MRN: [id number]  The patient is a 49 year old female here for a follow-up after undergoing bilateral mastectomies with expander and Flex HD placement 1 week ago.  Her drain output has been minimal but not ready for removal her incisions appear to be healing very nicely.  There does not appear to be any sign of infection or hematoma.  She is tender as expected and more so on the left side.   Review of Systems  Constitutional: Negative.   Respiratory: Negative.   Cardiovascular: Negative.   Hematological: Negative.        Objective:   Physical Exam Vitals and nursing note reviewed.  Constitutional:      Appearance: Normal appearance.  Cardiovascular:     Rate and Rhythm: Normal rate.     Pulses: Normal pulses.  Neurological:     General: No focal deficit present.     Mental Status: She is alert and oriented to person, place, and time.  Psychiatric:        Mood and Affect: Mood normal.        Behavior: Behavior normal.         Assessment & Plan:     ICD-10-CM   1. Acquired absence of both breasts  Z90.13     We placed injectable saline in the Expander using a sterile technique: Right: 50 cc for a total of 200 / 535 cc Left: 50 cc for a total of 200 / 535 cc We should be able to get the drains out next week. Continue the sports bra or binder, whichever is more comfortable

## 2019-05-28 ENCOUNTER — Telehealth: Payer: Self-pay

## 2019-05-28 ENCOUNTER — Telehealth: Payer: Self-pay | Admitting: Plastic Surgery

## 2019-05-28 NOTE — Telephone Encounter (Signed)
Spoke with patient. Redness and pain around drain sites, difficult to get comfortable with the drains. 'pus' is under the dermabond at the edge of the right incision, nothing coming out. No pain, redness around the incision. No fevers, chills.  Some nausea over the last few days. Will try ibuprofen and ice for drain site irritation and see [place] tomorrow for her appointment. Advised if condition worsens or if she begins spiking fevers to call us or go to the ER.

## 2019-05-28 NOTE — Telephone Encounter (Signed)
Patient reports pain and redness around the insertion of the drain, as well as an area of pus around the incision site on the right. She does not have a fever or chills but has felt nauseous with a headache for the past few mornings. She has an appointment with Dr. Marla Roe tomorrow morning, but would like to know if anything should be done in the meantime.

## 2019-05-29 ENCOUNTER — Encounter: Payer: Self-pay | Admitting: Plastic Surgery

## 2019-05-29 ENCOUNTER — Telehealth: Payer: Self-pay

## 2019-05-29 ENCOUNTER — Other Ambulatory Visit: Payer: Self-pay

## 2019-05-29 ENCOUNTER — Ambulatory Visit (INDEPENDENT_AMBULATORY_CARE_PROVIDER_SITE_OTHER): Payer: BC Managed Care – PPO | Admitting: Plastic Surgery

## 2019-05-29 VITALS — BP 103/68 | HR 82 | Temp 98.4°F | Ht 67.0 in | Wt 104.6 lb

## 2019-05-29 DIAGNOSIS — Z170 Estrogen receptor positive status [ER+]: Secondary | ICD-10-CM

## 2019-05-29 DIAGNOSIS — C50312 Malignant neoplasm of lower-inner quadrant of left female breast: Secondary | ICD-10-CM

## 2019-05-29 DIAGNOSIS — Z9013 Acquired absence of bilateral breasts and nipples: Secondary | ICD-10-CM

## 2019-05-29 MED ORDER — DOXYCYCLINE MONOHYDRATE 100 MG PO TABS: 100.0000 mg | ORAL_TABLET | Freq: Two times a day (BID) | ORAL | 0 refills | Status: AC

## 2019-05-29 NOTE — Telephone Encounter (Signed)
Patient called wanting clarification on doxycyline. She was prescribed a 3 day supply but thinks Dr. Marla Roe mentioned prescribing a 5 day supply. She would like clarification on this.

## 2019-05-29 NOTE — Telephone Encounter (Signed)
Confirmed with Dr. Marla Roe that doxyycline is for 3 days. Called patient and advised prescription is for 3 days and if she needs another round to call our office.

## 2019-05-29 NOTE — Progress Notes (Signed)
   Subjective:    Patient ID: Alice Davis, female    DOB: January 20, 1971, 49 y.o.   MRN: [id number]  The patient is a 49 year old female here for follow-up after undergoing bilateral nipple sparing mastectomies.  She has expanders in place.  She has 200 cc in each of the expanders today.  She has a little bit of redness around the drain site.  She is also having a little reaction to the Dermabond.  I removed some of the Dermabond.  I also removed the drains.  The output has been minimal.  She is at 2 weeks postop.  There no other areas of concern and she is not febrile.  She is still planning on getting radiation so we would like to get her switched to the implants as soon as possible.     Review of Systems  Constitutional: Negative.   Eyes: Negative.   Respiratory: Negative.   Cardiovascular: Negative.   Musculoskeletal: Negative.        Objective:   Physical Exam Vitals and nursing note reviewed.  Constitutional:      Appearance: Normal appearance.  Cardiovascular:     Rate and Rhythm: Normal rate.  Pulmonary:     Effort: Pulmonary effort is normal.  Neurological:     General: No focal deficit present.     Mental Status: She is alert and oriented to person, place, and time.  Psychiatric:        Mood and Affect: Mood normal.        Behavior: Behavior normal.        Thought Content: Thought content normal.       Assessment & Plan:     ICD-10-CM   1. Acquired absence of both breasts  Z90.13   2. Malignant neoplasm of lower-inner quadrant of left breast in female, estrogen receptor positive ([place])  C50.312    Z17.0     I sent in doxycycline for the redness at the drain site.  She can shower tomorrow.  Continue with sports bra.  I had like to see her back in 1 week and hopefully will be able to do a field. Last fill numbers are as below.  We did not fill today. Right: 200 / 535 cc Left: 200 / 535 cc

## 2019-06-04 ENCOUNTER — Telehealth: Payer: Self-pay | Admitting: *Deleted

## 2019-06-04 ENCOUNTER — Encounter (HOSPITAL_COMMUNITY): Payer: Self-pay | Admitting: Hematology and Oncology

## 2019-06-04 NOTE — Telephone Encounter (Signed)
Received mammaprint results of low risk.  Patient is aware.  Message sent for Dr. Isidore Moos appt.

## 2019-06-05 ENCOUNTER — Other Ambulatory Visit: Payer: Self-pay

## 2019-06-05 ENCOUNTER — Ambulatory Visit (INDEPENDENT_AMBULATORY_CARE_PROVIDER_SITE_OTHER): Payer: BC Managed Care – PPO | Admitting: Plastic Surgery

## 2019-06-05 ENCOUNTER — Encounter: Payer: Self-pay | Admitting: Plastic Surgery

## 2019-06-05 VITALS — BP 105/69 | HR 86 | Temp 97.8°F | Ht 67.0 in | Wt 140.0 lb

## 2019-06-05 DIAGNOSIS — Z9013 Acquired absence of bilateral breasts and nipples: Secondary | ICD-10-CM

## 2019-06-05 NOTE — Progress Notes (Signed)
Patient ID: Alice Davis, female    DOB: June 07, 1970, 49 y.o.   MRN: [id number]   Chief Complaint  Patient presents with  . Follow-up    3 week and fill    HPI Alice Davis is a 49 y.o. female after undergoing bilateral nipple sparing mastectomies on 05/14/2019 with Dr. Marlou Davis and immediate reconstruction with placement of tissue expanders and Flex HD with Dr. Marla Davis.  She is planning on getting radiation so we would like to get her switched to the implants as soon as possible.  Today she reports she is doing well overall.  She is interested in possibly doing saline implants.  She has had some itching of her hands and feet that started approximately 3 days after finishing the doxycycline.  There are no signs of a rash.  She has tried Benadryl and steroid cream without relief.  She feels like there are some bands under her left arm that are tight.  Denies fever, chest pain, shortness of breath.  Reports no concerns with her breast incisions. She would like to move forward with a fill today. Reports no issues with her previous fill.  Review of Systems  Constitutional: Negative for chills and fever.  HENT: Negative for congestion, rhinorrhea, sneezing and sore throat.   Respiratory: Negative for cough and shortness of breath.   Cardiovascular: Negative for chest pain.  Gastrointestinal: Negative for constipation, nausea and vomiting.  Musculoskeletal:       Reports some tightness under left arm. Reports itching of her hands and feet that started approximately 3 days after finishing the doxycycline  Skin: Negative for color change, pallor and rash.    Objective:   Vitals:   06/05/19 0809  BP: 105/69  Pulse: 86  Temp: 97.8 F (36.6 C)  SpO2: 97%    Physical Exam Constitutional:      Appearance: Normal appearance. She is normal weight.  HENT:     Head: Normocephalic and atraumatic.  Eyes:     Extraocular Movements: Extraocular movements intact.  Pulmonary:     Effort:  Pulmonary effort is normal.  Musculoskeletal:        General: Normal range of motion.     Cervical back: Normal range of motion.  Skin:    General: Skin is warm and dry.     Coloration: Skin is not pale.     Findings: No erythema or rash.     Comments: Incisions healing well, c/d/i. Drain site closed. No signs of redness, infection, seroma/hematoma, or drainage.   Neurological:     General: No focal deficit present.     Mental Status: She is alert and oriented to person, place, and time.  Psychiatric:        Mood and Affect: Mood normal.        Behavior: Behavior normal.        Thought Content: Thought content normal.        Judgment: Judgment normal.     General: alert, calm, no acute distress HEENT:normocephalic, atraumatic Chest: symmetrical rise and fall Pulmonary: unlabored breathing Musculoskeletal: normal range of motion Neuro: A&O x3, calm, cooperative, steady gait Skin: warm, dry. Breasts: Incisions c/d/i. Some dermabond present. No signs of redness, seroma/hematoma, drainage, or infection. Hands: no signs of rash.  Assessment & Plan:     ICD-10-CM   1. Acquired absence of both breasts  Z90.13     Overall [name] is doing well.  Breast incisions are healing well, C/D/I.  No  signs of infection, redness, drainage, seroma/hematoma.  Drain site has healed well.  Recommend she follow-up with her PCP concerning the itching of the hands and feet to check for other causes.  May take Benadryl to try to calm the itching.  We placed injectable saline in the Expander using a sterile technique: Right: 70 cc for a total of 270 / 535 cc Left: 70 cc for a total of 270 / 535 cc   Follow-up scheduled for 2/5 and 2/16 for additional fills.  Call office with any questions or concerns. Alice Heads, PA-C

## 2019-06-11 NOTE — Progress Notes (Addendum)
   Subjective:     Patient ID: Alice Davis, female    DOB: 1970-12-26, 49 y.o.   MRN: [id number]  Chief Complaint  Patient presents with  . Breast Problem    1 week for reconstruction w/placement of expanders and Flex HD    HPI: The patient is a 49 y.o. female here for follow-up after undergoing bilateral nipple sparing mastectomies with Dr. Marlou Davis and immediate reconstruction with placement of tissue expanders and Flex HD with Dr. Marla Davis on 05/14/2019.  She will be getting radiation at some point soon so we would like to get her switched to implants as soon as possible.  Today she reports she is doing very well. Itching of her hands & feet has fully resolved. She is interested in possibly doing saline implants. Denies F, CP, SOB, N/V.  Reports no concerns with her breasts. Feels tightness under left arm, 'feel like tight bands'. No issues with previous fill. She would like to move forward with additional fill today.  Review of Systems  Constitutional: Negative for chills and fever.  Respiratory: Negative for shortness of breath.   Cardiovascular: Negative for chest pain.  Gastrointestinal: Negative for constipation, diarrhea, nausea and vomiting.  Skin: Negative for color change, pallor and rash.     Objective:   Vital Signs BP 102/66 (BP Location: Right Arm, Patient Position: Sitting, Cuff Size: Normal)   Pulse 96   Temp (!) 97.1 F (36.2 C) (Temporal)   Ht 5\' 7"  (1.702 m)   Wt 139 lb 12.8 oz (63.4 kg)   LMP 06/12/2019 (Exact Date)   SpO2 98%   BMI 21.90 kg/m  Vital Signs and Nursing Note Reviewed  Physical Exam  Constitutional: She is oriented to person, place, and time and well-developed, well-nourished, and in no distress.  HENT:  Head: Normocephalic and atraumatic.  Eyes: EOM are normal.  Pulmonary/Chest: Effort normal.  Musculoskeletal:        General: Normal range of motion.     Cervical back: Normal range of motion.  Neurological: She is alert and oriented to  person, place, and time. Gait normal.  Skin: Skin is warm and dry. No rash noted. No erythema. No pallor.  Psychiatric: Mood, memory, affect and judgment normal.      Assessment/Plan:     ICD-10-CM   1. Acquired absence of both breasts  Z90.13     Alice Davis is doing very well today. Incisions healing well, c/d/i. No issue with last fill. Will start radiation soon, so would like to exchange for implants as soon as possible.   We placed injectable saline in the Expander using a sterile technique: Right: 70 cc for a total of 340 / 535 cc Left: 70 cc for a total of 340 / 535 cc  Follow up scheduled for 2/16 for additional fill. PT referral made to evaluate and treat limited ROM of arms.  The [place] was signed into law in 2016 which includes the topic of electronic health records.  This provides immediate access to information in MyChart.  This includes consultation notes, operative notes, office notes, lab results and pathology reports.  If you have any questions about what you read please let us know at your next visit or call us at the office.  We are right here with you.   Threasa Heads, PA-C 06/12/2019, 12:50 PM

## 2019-06-12 ENCOUNTER — Other Ambulatory Visit: Payer: Self-pay

## 2019-06-12 ENCOUNTER — Encounter: Payer: Self-pay | Admitting: Plastic Surgery

## 2019-06-12 ENCOUNTER — Ambulatory Visit (INDEPENDENT_AMBULATORY_CARE_PROVIDER_SITE_OTHER): Payer: BC Managed Care – PPO | Admitting: Plastic Surgery

## 2019-06-12 VITALS — BP 102/66 | HR 96 | Temp 97.1°F | Ht 67.0 in | Wt 139.8 lb

## 2019-06-12 DIAGNOSIS — Z9013 Acquired absence of bilateral breasts and nipples: Secondary | ICD-10-CM

## 2019-06-12 NOTE — Addendum Note (Signed)
Addended by: Phoebe Sharps on: 06/12/2019 01:24 PM   Modules accepted: Orders

## 2019-06-15 ENCOUNTER — Encounter: Payer: Self-pay | Admitting: *Deleted

## 2019-06-23 ENCOUNTER — Telehealth: Payer: Self-pay

## 2019-06-23 ENCOUNTER — Other Ambulatory Visit: Payer: Self-pay

## 2019-06-23 ENCOUNTER — Encounter: Payer: Self-pay | Admitting: Plastic Surgery

## 2019-06-23 ENCOUNTER — Ambulatory Visit (INDEPENDENT_AMBULATORY_CARE_PROVIDER_SITE_OTHER): Payer: BC Managed Care – PPO | Admitting: Plastic Surgery

## 2019-06-23 VITALS — BP 93/60 | HR 91 | Temp 98.0°F | Ht 67.0 in | Wt 141.0 lb

## 2019-06-23 DIAGNOSIS — Z9013 Acquired absence of bilateral breasts and nipples: Secondary | ICD-10-CM

## 2019-06-23 NOTE — Telephone Encounter (Signed)
Alice Davis called this morning to report that she will be scheduled to have breast implants and her expanders removed in the upcoming weeks as determined at her doctors appointment with Dr. Marla Roe today. She questioned if she needed to keep her appointment with Dr. Isidore Moos and for simulation tomorrow. Dr. Isidore Moos was made aware, and advised to cancel her appointments tomorrow and wait till she had recovered from surgery before scheduling the start of radiation. I have informed Alice Davis that her appointments have been cancelled for tomorrow. We will wait to hear back from either Alice Davis or Dr. Marla Roe.

## 2019-06-23 NOTE — Progress Notes (Addendum)
   Subjective:    Patient ID: Alice Davis, female    DOB: Mar 21, 1971, 49 y.o.   MRN: [id number]  The patient is a 49 year old female here for follow-up on her bilateral breast reconstruction.  Overall she is doing extremely well.  She has been tolerating expansion well.  Her incisions are healing nicely.  She has no sign of hematoma or seroma.  She has a little bit of asymmetry with the right breast a little higher than the left.  She did have asymmetry prior to her mastectomy as well.  We discussed the options for silicone versus saline.  At this time she would like to go with saline implants.  We are trying to get her exchange prior to her radiation treatment.  Radiation will be on the left side.  She is having some difficulty with abduction of her left arm and shoulder.     Review of Systems  Constitutional: Negative.   HENT: Negative.   Eyes: Negative.   Respiratory: Negative.   Cardiovascular: Negative.   Genitourinary: Negative.        Objective:   Physical Exam Vitals and nursing note reviewed.  Constitutional:      Appearance: Normal appearance.  Cardiovascular:     Rate and Rhythm: Normal rate.     Pulses: Normal pulses.  Pulmonary:     Effort: Pulmonary effort is normal.  Neurological:     General: No focal deficit present.     Mental Status: She is alert. Mental status is at baseline.  Psychiatric:        Mood and Affect: Mood normal.        Behavior: Behavior normal.        Thought Content: Thought content normal.       Assessment & Plan:     ICD-10-CM   1. Acquired absence of both breasts  Z90.13     We placed injectable saline in the Expander using a sterile technique: Right: 50 cc for a total of 390 / 535 cc Left: 50 cc for a total of 390 / 535 cc  Plan for physical therapy for help with range of motion of her left arm and shoulder.    We will go ahead and get surgery planned for exchange.  Plan for removal of bilateral expanders with placement of  silicone implants.  We are hoping to get this performed prior to her left-sided radiation.  Her risks of complication will be much lower if we can.  The [place] was signed into law in 2016 which includes the topic of electronic health records.  This provides immediate access to information in MyChart.  This includes consultation notes, operative notes, office notes, lab results and pathology reports.  If you have any questions about what you read please let us know at your next visit or call us at the office.  We are right here with you.

## 2019-06-23 NOTE — Addendum Note (Signed)
Encounter addended by: [name], MD on: 06/23/2019 8:24 AM  Actions taken: Clinical Note Signed

## 2019-06-24 ENCOUNTER — Ambulatory Visit
Admission: RE | Admit: 2019-06-24 | Discharge: 2019-06-24 | Disposition: A | Discharge status: Home / Self Care | Payer: BC Managed Care – PPO | Source: Ambulatory Visit | Attending: Radiation Oncology | Admitting: Radiation Oncology

## 2019-06-24 ENCOUNTER — Ambulatory Visit: Payer: BC Managed Care – PPO | Admitting: Radiation Oncology

## 2019-06-24 ENCOUNTER — Ambulatory Visit: Payer: BC Managed Care – PPO

## 2019-06-25 ENCOUNTER — Encounter: Payer: Self-pay | Admitting: *Deleted

## 2019-07-02 ENCOUNTER — Encounter: Payer: Self-pay | Admitting: *Deleted

## 2019-07-02 NOTE — H&P (View-Only) (Signed)
ICD-10-CM   1. Acquired absence of both breasts  Z90.13   2. Malignant neoplasm of lower-inner quadrant of left breast in female, estrogen receptor positive [facility])  C50.312    Z17.0       Patient ID: Alice Davis, female    DOB: 12/12/1970, 49 y.o.   MRN: [id number]   History of Present Illness: Alice Davis is a 49 y.o.  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  She presents for preoperative evaluation for upcoming procedure, removal of bilateral tissue expanders and placement of bilateral implants, scheduled for 07/13/19 with Dr. Marla Roe.  Physical Therapy referral placed at last visit 2/16 for difficulty with abduction of her left arm and shoulder. Reports she has gone one and will go at least one more time prior to surgery. After surgery will reevaluate if she still needs PT based on her progress. She has been tolerating her fills well. Has some asymmetry with the right breast a little higher than the left. Did have some asymmetry prior to her mastectomies as well. Will begin radiation after clearance post-surgery (2-4 weeks)  PMH Significant for: asthma. Latex allergy. Had reaction to the circular 'stickies' used to attach the heart monitor leads as 1 was left on after surgery.    The patient has not had problems with anesthesia.   Past Medical History: Allergies: Allergies  Allergen Reactions  . Latex Itching and Rash    Current Medications:  Current Outpatient Medications:  .  albuterol (VENTOLIN HFA) 108 (90 Base) MCG/ACT inhaler, Inhale 2 puffs into the lungs every 6 (six) hours as needed for wheezing or shortness of breath. , Disp: , Rfl:  .  cetirizine (ZYRTEC) 10 MG tablet, Take 10 mg by mouth daily. , Disp: , Rfl:  .  cyclobenzaprine (FLEXERIL) 5 MG tablet, Take 1 tablet (5 mg total) by mouth 2 (two) times daily as needed for up to 20 doses  for muscle spasms., Disp: 20 tablet, Rfl: 0 .  ELDERBERRY PO, Take 15 mLs by mouth daily., Disp: , Rfl:  .  Multiple Vitamins-Minerals (MULTIVITAMIN ADULT EXTRA C PO), Take 1 tablet by mouth daily. , Disp: , Rfl:  .  ondansetron (ZOFRAN) 4 MG tablet, Take 1 tablet (4 mg total) by mouth every 8 (eight) hours as needed for nausea or vomiting., Disp: 20 tablet, Rfl: 0  Past Medical Problems: Past Medical History:  Diagnosis Date  . Asthma   . Cancer [facility])     Past Surgical History: Past Surgical History:  Procedure Laterality Date  . BREAST RECONSTRUCTION WITH PLACEMENT OF TISSUE EXPANDER AND FLEX HD (ACELLULAR HYDRATED DERMIS) Bilateral 05/14/2019   Procedure: IMMEDIATE BREAST RECONSTRUCTION WITH PLACEMENT OF TISSUE EXPANDER AND FLEX HD (ACELLULAR HYDRATED DERMIS);  Surgeon: Wallace Going, DO;  Location: [place];  Service: Plastics;  Laterality: Bilateral;  . NASAL SINUS SURGERY     20 years ago  . NIPPLE SPARING MASTECTOMY WITH SENTINEL LYMPH NODE BIOPSY Bilateral 05/14/2019   Procedure: BILATERAL NIPPLE SPARING MASTECTOMIES WITH LEFT SENTINEL LYMPH NODE BIOPSY;  Surgeon: [name], MD;  Location: [place];  Service: General;  Laterality: Bilateral;  PEC  . RHINOPLASTY     20 years  . WISDOM TOOTH EXTRACTION      Social History: Social History   Socioeconomic History  . Marital status: Married    Spouse name: Not on file  .  Number of children: Not on file  . Years of education: Not on file  . Highest education level: Not on file  Occupational History  . Not on file  Tobacco Use  . Smoking status: Never Smoker  . Smokeless tobacco: Never Used  Substance and Sexual Activity  . Alcohol use: Not Currently  . Drug use: Never  . Sexual activity: Not on file  Other Topics Concern  . Not on file  Social History Narrative  . Not on file   Social Determinants of Health   Financial Resource Strain:   . Difficulty of Paying Living Expenses: Not on file  Food Insecurity:     . Worried About Charity fundraiser in the Last Year: Not on file  . Ran Out of Food in the Last Year: Not on file  Transportation Needs: No Transportation Needs  . Lack of Transportation (Medical): No  . Lack of Transportation (Non-Medical): No  Physical Activity:   . Days of Exercise per Week: Not on file  . Minutes of Exercise per Session: Not on file  Stress:   . Feeling of Stress : Not on file  Social Connections:   . Frequency of Communication with Friends and Family: Not on file  . Frequency of Social Gatherings with Friends and Family: Not on file  . Attends Religious Services: Not on file  . Active Member of Clubs or Organizations: Not on file  . Attends Archivist Meetings: Not on file  . Marital Status: Not on file  Intimate Partner Violence: Not At Risk  . Fear of Current or Ex-Partner: No  . Emotionally Abused: No  . Physically Abused: No  . Sexually Abused: No    Family History: No family history on file.  Review of Systems: Review of Systems  Constitutional: Negative for chills and fever.  HENT: Negative for congestion and sore throat.   Respiratory: Negative for cough and shortness of breath.   Cardiovascular: Negative for chest pain and palpitations.  Gastrointestinal: Negative for abdominal pain, nausea and vomiting.  Musculoskeletal: Negative for back pain, joint pain, myalgias and neck pain.  Skin: Negative for itching and rash.    Physical Exam: Vital Signs BP 112/72 (BP Location: Right Arm, Patient Position: Sitting, Cuff Size: Normal)   Pulse 93   Temp 98 F (36.7 C) (Temporal)   Ht '5\' 7"'$  (1.702 m)   Wt 139 lb (63 kg)   LMP 06/12/2019 (Exact Date)   SpO2 99%   BMI 21.77 kg/m  Physical Exam Constitutional:      Appearance: Normal appearance. She is normal weight.  HENT:     Head: Normocephalic and atraumatic.  Eyes:     Extraocular Movements: Extraocular movements intact.  Cardiovascular:     Rate and Rhythm: Normal rate and  regular rhythm.     Pulses: Normal pulses.     Heart sounds: Normal heart sounds.  Pulmonary:     Effort: Pulmonary effort is normal.     Breath sounds: Normal breath sounds. No wheezing, rhonchi or rales.  Chest:       Comments: Incisions healing very well, c/d/i. No signs of infection, seroma/hematoma, or drainage.  Abdominal:     General: Bowel sounds are normal.     Palpations: Abdomen is soft.  Musculoskeletal:        General: No swelling. Normal range of motion.     Cervical back: Normal range of motion.  Skin:    General: Skin is warm  and dry.     Coloration: Skin is not pale.     Findings: No erythema or rash.  Neurological:     General: No focal deficit present.     Mental Status: She is alert and oriented to person, place, and time.  Psychiatric:        Mood and Affect: Mood normal.        Behavior: Behavior normal.        Thought Content: Thought content normal.        Judgment: Judgment normal.     Assessment/Plan:  Ms. Kautzman scheduled for removal of bilateral tissue expanders and placement of bilateral implants with Dr. Marla Roe.  Risks, benefits, and alternatives of procedure discussed, questions answered and consent obtained.    She may start radiation mapping/treatment 2-4 weeks after surgery.  Expander fill totals: Right: 390 / 535 cc Left: 390 / 535 cc  Smoking Status: non-smoker Last Mammogram: N/A bilateral mastectomies Latex allergy. Make sure circular sticky pads for heart monitor leads are removed after surgery/recovery.   Caprini Score: 5 High; Risk Factors include: 49 yr-old female, hx of cancer, and length of planned surgery. Recommendation for mechanical and pharmacological prophylaxis during surgery. Encourage early ambulation.   Pictures obtained: 06/23/19  Post-op Rx sent to pharmacy: Keflex. Patient reports they have plenty of zofran and norco remaining from previous surgery to use if needed.  The [place] [employer] was signed  into law in 2016 which includes the topic of electronic health records.  This provides immediate access to information in MyChart.  This includes consultation notes, operative notes, office notes, lab results and pathology reports.  If you have any questions about what you read please let us know at your next visit or call us at the office.  We are right here with you.     Electronically signed by: Threasa Heads, PA-C 07/03/2019 11:54 AM

## 2019-07-02 NOTE — Progress Notes (Signed)
ICD-10-CM   1. Acquired absence of both breasts  Z90.13   2. Malignant neoplasm of lower-inner quadrant of left breast in female, estrogen receptor positive [facility])  C50.312    Z17.0       Patient ID: Alice Davis, female    DOB: 12/12/1970, 49 y.o.   MRN: [id number]   History of Present Illness: Alice Davis is a 49 y.o.  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  She presents for preoperative evaluation for upcoming procedure, removal of bilateral tissue expanders and placement of bilateral implants, scheduled for 07/13/19 with Dr. Marla Roe.  Physical Therapy referral placed at last visit 2/16 for difficulty with abduction of her left arm and shoulder. Reports she has gone one and will go at least one more time prior to surgery. After surgery will reevaluate if she still needs PT based on her progress. She has been tolerating her fills well. Has some asymmetry with the right breast a little higher than the left. Did have some asymmetry prior to her mastectomies as well. Will begin radiation after clearance post-surgery (2-4 weeks)  PMH Significant for: asthma. Latex allergy. Had reaction to the circular 'stickies' used to attach the heart monitor leads as 1 was left on after surgery.    The patient has not had problems with anesthesia.   Past Medical History: Allergies: Allergies  Allergen Reactions  . Latex Itching and Rash    Current Medications:  Current Outpatient Medications:  .  albuterol (VENTOLIN HFA) 108 (90 Base) MCG/ACT inhaler, Inhale 2 puffs into the lungs every 6 (six) hours as needed for wheezing or shortness of breath. , Disp: , Rfl:  .  cetirizine (ZYRTEC) 10 MG tablet, Take 10 mg by mouth daily. , Disp: , Rfl:  .  cyclobenzaprine (FLEXERIL) 5 MG tablet, Take 1 tablet (5 mg total) by mouth 2 (two) times daily as needed for up to 20 doses  for muscle spasms., Disp: 20 tablet, Rfl: 0 .  ELDERBERRY PO, Take 15 mLs by mouth daily., Disp: , Rfl:  .  Multiple Vitamins-Minerals (MULTIVITAMIN ADULT EXTRA C PO), Take 1 tablet by mouth daily. , Disp: , Rfl:  .  ondansetron (ZOFRAN) 4 MG tablet, Take 1 tablet (4 mg total) by mouth every 8 (eight) hours as needed for nausea or vomiting., Disp: 20 tablet, Rfl: 0  Past Medical Problems: Past Medical History:  Diagnosis Date  . Asthma   . Cancer [facility])     Past Surgical History: Past Surgical History:  Procedure Laterality Date  . BREAST RECONSTRUCTION WITH PLACEMENT OF TISSUE EXPANDER AND FLEX HD (ACELLULAR HYDRATED DERMIS) Bilateral 05/14/2019   Procedure: IMMEDIATE BREAST RECONSTRUCTION WITH PLACEMENT OF TISSUE EXPANDER AND FLEX HD (ACELLULAR HYDRATED DERMIS);  Surgeon: Wallace Going, DO;  Location: [place];  Service: Plastics;  Laterality: Bilateral;  . NASAL SINUS SURGERY     20 years ago  . NIPPLE SPARING MASTECTOMY WITH SENTINEL LYMPH NODE BIOPSY Bilateral 05/14/2019   Procedure: BILATERAL NIPPLE SPARING MASTECTOMIES WITH LEFT SENTINEL LYMPH NODE BIOPSY;  Surgeon: [name], MD;  Location: [place];  Service: General;  Laterality: Bilateral;  PEC  . RHINOPLASTY     20 years  . WISDOM TOOTH EXTRACTION      Social History: Social History   Socioeconomic History  . Marital status: Married    Spouse name: Not on file  .  Number of children: Not on file  . Years of education: Not on file  . Highest education level: Not on file  Occupational History  . Not on file  Tobacco Use  . Smoking status: Never Smoker  . Smokeless tobacco: Never Used  Substance and Sexual Activity  . Alcohol use: Not Currently  . Drug use: Never  . Sexual activity: Not on file  Other Topics Concern  . Not on file  Social History Narrative  . Not on file   Social Determinants of Health   Financial Resource Strain:   . Difficulty of Paying Living Expenses: Not on file  Food Insecurity:     . Worried About Charity fundraiser in the Last Year: Not on file  . Ran Out of Food in the Last Year: Not on file  Transportation Needs: No Transportation Needs  . Lack of Transportation (Medical): No  . Lack of Transportation (Non-Medical): No  Physical Activity:   . Days of Exercise per Week: Not on file  . Minutes of Exercise per Session: Not on file  Stress:   . Feeling of Stress : Not on file  Social Connections:   . Frequency of Communication with Friends and Family: Not on file  . Frequency of Social Gatherings with Friends and Family: Not on file  . Attends Religious Services: Not on file  . Active Member of Clubs or Organizations: Not on file  . Attends Archivist Meetings: Not on file  . Marital Status: Not on file  Intimate Partner Violence: Not At Risk  . Fear of Current or Ex-Partner: No  . Emotionally Abused: No  . Physically Abused: No  . Sexually Abused: No    Family History: No family history on file.  Review of Systems: Review of Systems  Constitutional: Negative for chills and fever.  HENT: Negative for congestion and sore throat.   Respiratory: Negative for cough and shortness of breath.   Cardiovascular: Negative for chest pain and palpitations.  Gastrointestinal: Negative for abdominal pain, nausea and vomiting.  Musculoskeletal: Negative for back pain, joint pain, myalgias and neck pain.  Skin: Negative for itching and rash.    Physical Exam: Vital Signs BP 112/72 (BP Location: Right Arm, Patient Position: Sitting, Cuff Size: Normal)   Pulse 93   Temp 98 F (36.7 C) (Temporal)   Ht '5\' 7"'$  (1.702 m)   Wt 139 lb (63 kg)   LMP 06/12/2019 (Exact Date)   SpO2 99%   BMI 21.77 kg/m  Physical Exam Constitutional:      Appearance: Normal appearance. She is normal weight.  HENT:     Head: Normocephalic and atraumatic.  Eyes:     Extraocular Movements: Extraocular movements intact.  Cardiovascular:     Rate and Rhythm: Normal rate and  regular rhythm.     Pulses: Normal pulses.     Heart sounds: Normal heart sounds.  Pulmonary:     Effort: Pulmonary effort is normal.     Breath sounds: Normal breath sounds. No wheezing, rhonchi or rales.  Chest:       Comments: Incisions healing very well, c/d/i. No signs of infection, seroma/hematoma, or drainage.  Abdominal:     General: Bowel sounds are normal.     Palpations: Abdomen is soft.  Musculoskeletal:        General: No swelling. Normal range of motion.     Cervical back: Normal range of motion.  Skin:    General: Skin is warm  and dry.     Coloration: Skin is not pale.     Findings: No erythema or rash.  Neurological:     General: No focal deficit present.     Mental Status: She is alert and oriented to person, place, and time.  Psychiatric:        Mood and Affect: Mood normal.        Behavior: Behavior normal.        Thought Content: Thought content normal.        Judgment: Judgment normal.     Assessment/Plan:  Ms. Kautzman scheduled for removal of bilateral tissue expanders and placement of bilateral implants with Dr. Marla Roe.  Risks, benefits, and alternatives of procedure discussed, questions answered and consent obtained.    She may start radiation mapping/treatment 2-4 weeks after surgery.  Expander fill totals: Right: 390 / 535 cc Left: 390 / 535 cc  Smoking Status: non-smoker Last Mammogram: N/A bilateral mastectomies Latex allergy. Make sure circular sticky pads for heart monitor leads are removed after surgery/recovery.   Caprini Score: 5 High; Risk Factors include: 49 yr-old female, hx of cancer, and length of planned surgery. Recommendation for mechanical and pharmacological prophylaxis during surgery. Encourage early ambulation.   Pictures obtained: 06/23/19  Post-op Rx sent to pharmacy: Keflex. Patient reports they have plenty of zofran and norco remaining from previous surgery to use if needed.  The [place] [employer] was signed  into law in 2016 which includes the topic of electronic health records.  This provides immediate access to information in MyChart.  This includes consultation notes, operative notes, office notes, lab results and pathology reports.  If you have any questions about what you read please let us know at your next visit or call us at the office.  We are right here with you.     Electronically signed by: Threasa Heads, PA-C 07/03/2019 11:54 AM

## 2019-07-03 ENCOUNTER — Encounter: Payer: Self-pay | Admitting: Plastic Surgery

## 2019-07-03 ENCOUNTER — Other Ambulatory Visit: Payer: Self-pay

## 2019-07-03 ENCOUNTER — Ambulatory Visit (INDEPENDENT_AMBULATORY_CARE_PROVIDER_SITE_OTHER): Payer: BC Managed Care – PPO | Admitting: Plastic Surgery

## 2019-07-03 VITALS — BP 112/72 | HR 93 | Temp 98.0°F | Ht 67.0 in | Wt 139.0 lb

## 2019-07-03 DIAGNOSIS — C50312 Malignant neoplasm of lower-inner quadrant of left female breast: Secondary | ICD-10-CM

## 2019-07-03 DIAGNOSIS — Z9013 Acquired absence of bilateral breasts and nipples: Secondary | ICD-10-CM

## 2019-07-03 DIAGNOSIS — Z170 Estrogen receptor positive status [ER+]: Secondary | ICD-10-CM

## 2019-07-06 ENCOUNTER — Other Ambulatory Visit: Payer: Self-pay

## 2019-07-06 ENCOUNTER — Encounter (HOSPITAL_BASED_OUTPATIENT_CLINIC_OR_DEPARTMENT_OTHER): Payer: Self-pay | Admitting: Plastic Surgery

## 2019-07-07 ENCOUNTER — Ambulatory Visit: Payer: BC Managed Care – PPO | Attending: Plastic Surgery | Admitting: Physical Therapy

## 2019-07-07 DIAGNOSIS — R293 Abnormal posture: Secondary | ICD-10-CM | POA: Diagnosis not present

## 2019-07-07 DIAGNOSIS — Z170 Estrogen receptor positive status [ER+]: Secondary | ICD-10-CM | POA: Diagnosis present

## 2019-07-07 DIAGNOSIS — M25611 Stiffness of right shoulder, not elsewhere classified: Secondary | ICD-10-CM | POA: Insufficient documentation

## 2019-07-07 DIAGNOSIS — M25612 Stiffness of left shoulder, not elsewhere classified: Secondary | ICD-10-CM | POA: Insufficient documentation

## 2019-07-07 DIAGNOSIS — C50312 Malignant neoplasm of lower-inner quadrant of left female breast: Secondary | ICD-10-CM | POA: Insufficient documentation

## 2019-07-07 DIAGNOSIS — Z9013 Acquired absence of bilateral breasts and nipples: Secondary | ICD-10-CM | POA: Diagnosis present

## 2019-07-08 ENCOUNTER — Other Ambulatory Visit (INDEPENDENT_AMBULATORY_CARE_PROVIDER_SITE_OTHER): Payer: BC Managed Care – PPO | Admitting: Plastic Surgery

## 2019-07-08 ENCOUNTER — Encounter: Payer: Self-pay | Admitting: Physical Therapy

## 2019-07-08 ENCOUNTER — Telehealth: Payer: Self-pay

## 2019-07-08 ENCOUNTER — Encounter: Payer: Self-pay | Admitting: *Deleted

## 2019-07-08 DIAGNOSIS — Z9013 Acquired absence of bilateral breasts and nipples: Secondary | ICD-10-CM

## 2019-07-08 MED ORDER — CEPHALEXIN 500 MG PO CAPS: 500.0000 mg | ORAL_CAPSULE | Freq: Four times a day (QID) | ORAL | 0 refills | Status: AC

## 2019-07-08 NOTE — Therapy (Signed)
[place], [place], [address] Phone: [phone]   Fax:  [phone]  Physical Therapy Evaluation  Patient Details  Name: Alice Davis MRN: [id number] Date of Birth: 03-Apr-1971 Referring Provider (PT): Alice Hives, DO   Encounter Date: 07/07/2019  PT End of Session - 07/08/19 1401    Visit Number  1    PT Start Time  1630    PT Stop Time  [id number]    PT Time Calculation (min)  44 min    Activity Tolerance  Patient tolerated treatment well    Behavior During Therapy  [facility] for tasks assessed/performed       Past Medical History:  Diagnosis Date  . Asthma   . Cancer [facility])     Past Surgical History:  Procedure Laterality Date  . BREAST RECONSTRUCTION WITH PLACEMENT OF TISSUE EXPANDER AND FLEX HD (ACELLULAR HYDRATED DERMIS) Bilateral 05/14/2019   Procedure: IMMEDIATE BREAST RECONSTRUCTION WITH PLACEMENT OF TISSUE EXPANDER AND FLEX HD (ACELLULAR HYDRATED DERMIS);  Surgeon: Alice Going, DO;  Location: [place];  Service: Plastics;  Laterality: Bilateral;  . NASAL SINUS SURGERY     20 years ago  . NIPPLE SPARING MASTECTOMY WITH SENTINEL LYMPH NODE BIOPSY Bilateral 05/14/2019   Procedure: BILATERAL NIPPLE SPARING MASTECTOMIES WITH LEFT SENTINEL LYMPH NODE BIOPSY;  Surgeon: Alice Kussmaul, MD;  Location: [place];  Service: General;  Laterality: Bilateral;  PEC  . RHINOPLASTY     20 years  . WISDOM TOOTH EXTRACTION      There were no vitals filed for this visit.   Subjective Assessment - 07/07/19 1638    Subjective  30th radiation simulation Jan 7 had tissue expander placed. Doing stretches and exercises at home. Expanders to be removed on monday. Numb on back of arm still. Used to be all the way down my arm but now just upper arm.    Currently in Pain?  No/denies   just tight        [facility] PT Assessment - 07/08/19 0001      Assessment   Medical Diagnosis  inability to abduct    Referring  Provider (PT)  Alice Hives, DO      Precautions   Precaution Comments  CA, tissue expanders      Restrictions   Weight Bearing Restrictions  No      Balance Screen   Has the patient fallen in the past 6 months  No      [place] residence    Living Arrangements  Spouse/significant other;Children      Prior Function   Level of Independence  Independent      Cognition   Overall Cognitive Status  Within Functional Limits for tasks assessed      Sensation   Additional Comments  numbness along triceps      Posture/Postural Control   Posture Comments  rounded shoulder      Palpation   Palpation comment  tightness along pecs and biceps                Objective measurements completed on examination: See above findings.      [facility] Adult PT Treatment/Exercise - 07/08/19 0001      Manual Therapy   Manual therapy comments  STM & stretching to Alice Davis             PT Education - 07/08/19 1401    Education Details  see plan  Person(s) Educated  Patient;Spouse    Methods  Explanation;Demonstration    Comprehension  Verbalized understanding                  Plan - 07/08/19 1401    Clinical Impression Statement  tightness along pecs and biceps noted with good ROM as she has beens tretching at home. Husband video recorded STM & instruction for him/son to help at home. Alice Davis from [facility] rehab came to discuss radiation with her and provided information regarding posture and expectations in PT moving forward. Pt will not be scheduling more today as she is having surgery on Mon and verbalized understanding that she should obtain a referral to [place] rehab should she need more PT in the future.    Personal Factors and Comorbidities  Comorbidity 1    Comorbidities  tissue expanders    Examination-Activity Limitations  Bathing;Reach Overhead;Sleep;Carry;Dressing;Lift;Toileting    Examination-Participation Restrictions   Other    Stability/Clinical Decision Making  Stable/Uncomplicated    Clinical Decision Making  Low    Rehab Potential  Good    PT Frequency  One time visit    PT Treatment/Interventions  ADLs/Self Care Home Management;Manual techniques;Patient/family education    Consulted and Agree with Plan of Care  Patient;Family member/caregiver    Family Member Consulted  husband       Patient will benefit from skilled therapeutic intervention in order to improve the following deficits and impairments:  Decreased range of motion, Impaired flexibility  Visit Diagnosis: Abnormal posture - Plan: PT plan of care cert/re-cert     Problem List Patient Active Problem List   Diagnosis Date Noted  . Acquired absence of breast 05/22/2019  . Breast cancer ([place]) 05/14/2019  . Malignant neoplasm of lower-inner quadrant of left breast in female, estrogen receptor positive ([place]) 03/24/2019  . Allergic dermatitis 12/27/2017  . Reactive airway disease without complication [id number]  . Allergic rhinitis 11/28/2016  . Hyperlipidemia 11/28/2016    Alice Difrancesco C. Lehua Davis PT, DPT 07/08/19 3:54 PM   [facility] [address] [place], [place], [address] Phone: [phone]   Fax:  [phone]  Name: Alice Davis MRN: [id number] Date of Birth: [birth year]

## 2019-07-08 NOTE — Progress Notes (Signed)
Post-op Abx sent to pharmacy

## 2019-07-08 NOTE — Telephone Encounter (Signed)
Patient called requesting that her post op antibiotic prescription be sent to the pharmacy.

## 2019-07-09 ENCOUNTER — Other Ambulatory Visit (HOSPITAL_COMMUNITY)
Admission: RE | Admit: 2019-07-09 | Discharge: 2019-07-09 | Disposition: A | Discharge status: Home / Self Care | Payer: BC Managed Care – PPO | Source: Ambulatory Visit | Attending: Plastic Surgery | Admitting: Plastic Surgery

## 2019-07-09 DIAGNOSIS — Z20822 Contact with and (suspected) exposure to covid-19: Secondary | ICD-10-CM | POA: Insufficient documentation

## 2019-07-09 DIAGNOSIS — Z01812 Encounter for preprocedural laboratory examination: Secondary | ICD-10-CM | POA: Diagnosis not present

## 2019-07-09 LAB — SARS CORONAVIRUS 2 (TAT 6-24 HRS): SARS Coronavirus 2: NEGATIVE

## 2019-07-10 ENCOUNTER — Ambulatory Visit: Payer: BC Managed Care – PPO | Admitting: Physical Therapy

## 2019-07-12 NOTE — Anesthesia Preprocedure Evaluation (Addendum)
Anesthesia Evaluation  Patient identified by MRN, date of birth, ID band Patient awake    Reviewed: Allergy & Precautions, NPO status , Patient's Chart, lab work & pertinent test results  Airway Mallampati: II  TM Distance: >3 FB     Dental   Pulmonary    breath sounds clear to auscultation       Cardiovascular negative cardio ROS   Rhythm:Regular Rate:Normal     Neuro/Psych    GI/Hepatic negative GI ROS, Neg liver ROS,   Endo/Other  negative endocrine ROS  Renal/GU negative Renal ROS     Musculoskeletal   Abdominal   Peds  Hematology negative hematology ROS (+)   Anesthesia Other Findings   Reproductive/Obstetrics                            Anesthesia Physical Anesthesia Plan  ASA: II  Anesthesia Plan: General   Post-op Pain Management:    Induction: Intravenous  PONV Risk Score and Plan: Ondansetron and Midazolam  Airway Management Planned: Oral ETT  Additional Equipment:   Intra-op Plan:   Post-operative Plan:   Informed Consent: I have reviewed the patients History and Physical, chart, labs and discussed the procedure including the risks, benefits and alternatives for the proposed anesthesia with the patient or authorized representative who has indicated his/her understanding and acceptance.     Dental advisory given  Plan Discussed with: Anesthesiologist  Anesthesia Plan Comments:        Anesthesia Quick Evaluation

## 2019-07-13 ENCOUNTER — Encounter (HOSPITAL_BASED_OUTPATIENT_CLINIC_OR_DEPARTMENT_OTHER)
Admission: RE | Disposition: A | Discharge status: Home / Self Care | Payer: Self-pay | Source: Home / Self Care | Attending: Plastic Surgery

## 2019-07-13 ENCOUNTER — Other Ambulatory Visit: Payer: Self-pay

## 2019-07-13 ENCOUNTER — Ambulatory Visit (HOSPITAL_BASED_OUTPATIENT_CLINIC_OR_DEPARTMENT_OTHER): Payer: BC Managed Care – PPO | Admitting: Anesthesiology

## 2019-07-13 ENCOUNTER — Encounter (HOSPITAL_BASED_OUTPATIENT_CLINIC_OR_DEPARTMENT_OTHER): Payer: Self-pay | Admitting: Plastic Surgery

## 2019-07-13 ENCOUNTER — Ambulatory Visit (HOSPITAL_BASED_OUTPATIENT_CLINIC_OR_DEPARTMENT_OTHER)
Admission: RE | Admit: 2019-07-13 | Discharge: 2019-07-13 | Disposition: A | Discharge status: Home / Self Care | Payer: BC Managed Care – PPO | Attending: Plastic Surgery | Admitting: Plastic Surgery

## 2019-07-13 DIAGNOSIS — Z421 Encounter for breast reconstruction following mastectomy: Secondary | ICD-10-CM | POA: Diagnosis present

## 2019-07-13 DIAGNOSIS — Z853 Personal history of malignant neoplasm of breast: Secondary | ICD-10-CM | POA: Diagnosis not present

## 2019-07-13 DIAGNOSIS — Z9013 Acquired absence of bilateral breasts and nipples: Secondary | ICD-10-CM | POA: Insufficient documentation

## 2019-07-13 HISTORY — PX: REMOVAL OF BILATERAL TISSUE EXPANDERS WITH PLACEMENT OF BILATERAL BREAST IMPLANTS: SHX6431

## 2019-07-13 LAB — POCT PREGNANCY, URINE: Preg Test, Ur: NEGATIVE

## 2019-07-13 SURGERY — REMOVAL, TISSUE EXPANDER, BREAST, BILATERAL, WITH BILATERAL IMPLANT IMPLANT INSERTION
Anesthesia: General | Site: Breast | Laterality: Bilateral

## 2019-07-13 MED ORDER — ONDANSETRON HCL 4 MG/2ML IJ SOLN
INTRAMUSCULAR | Status: DC | PRN
  Administered 2019-07-13: 4 mg via INTRAVENOUS

## 2019-07-13 MED ORDER — FENTANYL CITRATE (PF) 100 MCG/2ML IJ SOLN
50.0000 ug | INTRAMUSCULAR | Status: AC | PRN
  Administered 2019-07-13: 50 ug via INTRAVENOUS
  Administered 2019-07-13 (×2): 25 ug via INTRAVENOUS

## 2019-07-13 MED ORDER — PROPOFOL 500 MG/50ML IV EMUL
INTRAVENOUS | Status: AC
  Filled 2019-07-13: qty 50

## 2019-07-13 MED ORDER — EPHEDRINE SULFATE 50 MG/ML IJ SOLN
INTRAMUSCULAR | Status: DC | PRN
  Administered 2019-07-13 (×5): 5 mg via INTRAVENOUS

## 2019-07-13 MED ORDER — LIDOCAINE-EPINEPHRINE 1 %-1:100000 IJ SOLN
INTRAMUSCULAR | Status: DC | PRN
  Administered 2019-07-13: 3.5 mL

## 2019-07-13 MED ORDER — DEXAMETHASONE SODIUM PHOSPHATE 10 MG/ML IJ SOLN
INTRAMUSCULAR | Status: AC
  Filled 2019-07-13: qty 2

## 2019-07-13 MED ORDER — BUPIVACAINE HCL (PF) 0.25 % IJ SOLN
INTRAMUSCULAR | Status: DC | PRN
  Administered 2019-07-13: 3.5 mL

## 2019-07-13 MED ORDER — ACETAMINOPHEN 325 MG PO TABS: 650.0000 mg | ORAL_TABLET | ORAL | Status: DC | PRN

## 2019-07-13 MED ORDER — MIDAZOLAM HCL 2 MG/2ML IJ SOLN
1.0000 mg | INTRAMUSCULAR | Status: DC | PRN
  Administered 2019-07-13: 2 mg via INTRAVENOUS

## 2019-07-13 MED ORDER — CEFAZOLIN SODIUM-DEXTROSE 2-4 GM/100ML-% IV SOLN
2.0000 g | INTRAVENOUS | Status: AC
  Administered 2019-07-13: 2 g via INTRAVENOUS

## 2019-07-13 MED ORDER — FENTANYL CITRATE (PF) 100 MCG/2ML IJ SOLN
INTRAMUSCULAR | Status: AC
  Filled 2019-07-13: qty 2

## 2019-07-13 MED ORDER — LACTATED RINGERS IV SOLN: INTRAVENOUS | Status: DC

## 2019-07-13 MED ORDER — SODIUM CHLORIDE 0.9% FLUSH: 3.0000 mL | Freq: Two times a day (BID) | INTRAVENOUS | Status: DC

## 2019-07-13 MED ORDER — LIDOCAINE HCL (CARDIAC) PF 100 MG/5ML IV SOSY
PREFILLED_SYRINGE | INTRAVENOUS | Status: DC | PRN
  Administered 2019-07-13: 60 mg via INTRAVENOUS

## 2019-07-13 MED ORDER — DEXAMETHASONE SODIUM PHOSPHATE 10 MG/ML IJ SOLN
INTRAMUSCULAR | Status: DC | PRN
  Administered 2019-07-13: 5 mg via INTRAVENOUS

## 2019-07-13 MED ORDER — ACETAMINOPHEN 650 MG RE SUPP: 650.0000 mg | RECTAL | Status: DC | PRN

## 2019-07-13 MED ORDER — OXYCODONE HCL 5 MG PO TABS: 5.0000 mg | ORAL_TABLET | ORAL | Status: DC | PRN

## 2019-07-13 MED ORDER — SUGAMMADEX SODIUM 200 MG/2ML IV SOLN
INTRAVENOUS | Status: DC | PRN
  Administered 2019-07-13: 200 mg via INTRAVENOUS

## 2019-07-13 MED ORDER — FENTANYL CITRATE (PF) 100 MCG/2ML IJ SOLN: 25.0000 ug | INTRAMUSCULAR | Status: DC | PRN

## 2019-07-13 MED ORDER — MIDAZOLAM HCL 2 MG/2ML IJ SOLN
INTRAMUSCULAR | Status: AC
  Filled 2019-07-13: qty 2

## 2019-07-13 MED ORDER — CEFAZOLIN SODIUM-DEXTROSE 2-4 GM/100ML-% IV SOLN
INTRAVENOUS | Status: AC
  Filled 2019-07-13: qty 100

## 2019-07-13 MED ORDER — MORPHINE SULFATE (PF) 4 MG/ML IV SOLN: 2.0000 mg | INTRAVENOUS | Status: DC | PRN

## 2019-07-13 MED ORDER — PROPOFOL 10 MG/ML IV BOLUS
INTRAVENOUS | Status: DC | PRN
  Administered 2019-07-13: 150 mg via INTRAVENOUS

## 2019-07-13 MED ORDER — SODIUM CHLORIDE 0.9 % IV SOLN
INTRAVENOUS | Status: DC | PRN
  Administered 2019-07-13: 500 mL

## 2019-07-13 MED ORDER — SODIUM CHLORIDE 0.9% FLUSH: 3.0000 mL | INTRAVENOUS | Status: DC | PRN

## 2019-07-13 MED ORDER — ONDANSETRON HCL 4 MG/2ML IJ SOLN
INTRAMUSCULAR | Status: AC
  Filled 2019-07-13: qty 4

## 2019-07-13 MED ORDER — SODIUM CHLORIDE 0.9 % IV SOLN: 250.0000 mL | INTRAVENOUS | Status: DC | PRN

## 2019-07-13 MED ORDER — CHLORHEXIDINE GLUCONATE CLOTH 2 % EX PADS: 6.0000 | MEDICATED_PAD | Freq: Once | CUTANEOUS | Status: DC

## 2019-07-13 MED ORDER — ROCURONIUM BROMIDE 100 MG/10ML IV SOLN
INTRAVENOUS | Status: DC | PRN
  Administered 2019-07-13: 50 mg via INTRAVENOUS

## 2019-07-13 MED ORDER — LIDOCAINE 2% (20 MG/ML) 5 ML SYRINGE
INTRAMUSCULAR | Status: AC
  Filled 2019-07-13: qty 5

## 2019-07-13 MED ORDER — PHENYLEPHRINE HCL (PRESSORS) 10 MG/ML IV SOLN
INTRAVENOUS | Status: DC | PRN
  Administered 2019-07-13: 80 ug via INTRAVENOUS
  Administered 2019-07-13 (×2): 40 ug via INTRAVENOUS
  Administered 2019-07-13: 80 ug via INTRAVENOUS
  Administered 2019-07-13: 40 ug via INTRAVENOUS

## 2019-07-13 SURGICAL SUPPLY — 68 items
ADH SKN CLS APL DERMABOND .7 (GAUZE/BANDAGES/DRESSINGS) ×2
BAG DECANTER FOR FLEXI CONT (MISCELLANEOUS) ×2 IMPLANT
BINDER BREAST LRG (GAUZE/BANDAGES/DRESSINGS) ×1 IMPLANT
BINDER BREAST MEDIUM (GAUZE/BANDAGES/DRESSINGS) IMPLANT
BINDER BREAST XLRG (GAUZE/BANDAGES/DRESSINGS) IMPLANT
BINDER BREAST XXLRG (GAUZE/BANDAGES/DRESSINGS) IMPLANT
BIOPATCH RED 1 DISK 7.0 (GAUZE/BANDAGES/DRESSINGS) IMPLANT
BLADE HEX COATED 2.75 (ELECTRODE) ×2 IMPLANT
BLADE SURG 15 STRL LF DISP TIS (BLADE) ×2 IMPLANT
BLADE SURG 15 STRL SS (BLADE) ×4
CANISTER SUCT 1200ML W/VALVE (MISCELLANEOUS) ×2 IMPLANT
CORD BIPOLAR FORCEPS 12FT (ELECTRODE) IMPLANT
COVER BACK TABLE 60X90IN (DRAPES) ×2 IMPLANT
COVER MAYO STAND STRL (DRAPES) ×2 IMPLANT
COVER WAND RF STERILE (DRAPES) IMPLANT
DECANTER SPIKE VIAL GLASS SM (MISCELLANEOUS) ×1 IMPLANT
DERMABOND ADVANCED (GAUZE/BANDAGES/DRESSINGS) ×2
DERMABOND ADVANCED .7 DNX12 (GAUZE/BANDAGES/DRESSINGS) IMPLANT
DRAIN CHANNEL 19F RND (DRAIN) IMPLANT
DRAPE LAPAROSCOPIC ABDOMINAL (DRAPES) ×2 IMPLANT
DRSG PAD ABDOMINAL 8X10 ST (GAUZE/BANDAGES/DRESSINGS) ×4 IMPLANT
ELECT BLADE 4.0 EZ CLEAN MEGAD (MISCELLANEOUS) ×2
ELECT REM PT RETURN 9FT ADLT (ELECTROSURGICAL) ×2
ELECTRODE BLDE 4.0 EZ CLN MEGD (MISCELLANEOUS) ×1 IMPLANT
ELECTRODE REM PT RTRN 9FT ADLT (ELECTROSURGICAL) ×1 IMPLANT
EVACUATOR SILICONE 100CC (DRAIN) IMPLANT
GAUZE SPONGE 4X4 12PLY STRL LF (GAUZE/BANDAGES/DRESSINGS) IMPLANT
GLOVE BIO SURGEON STRL SZ 6.5 (GLOVE) ×10 IMPLANT
GLOVE BIO SURGEON STRL SZ7 (GLOVE) IMPLANT
GOWN STRL REUS W/ TWL LRG LVL3 (GOWN DISPOSABLE) ×2 IMPLANT
GOWN STRL REUS W/TWL LRG LVL3 (GOWN DISPOSABLE) ×4
IMPL BREAST P5.8XHI RND 415 (Breast) IMPLANT
IMPL BRST P5.8XHI RND 415CC (Breast) ×2 IMPLANT
IMPLANT BREAST GEL 415CC (Breast) ×4 IMPLANT
IV NS 1000ML (IV SOLUTION)
IV NS 1000ML BAXH (IV SOLUTION) IMPLANT
IV NS 500ML (IV SOLUTION)
IV NS 500ML BAXH (IV SOLUTION) IMPLANT
KIT FILL SYSTEM UNIVERSAL (SET/KITS/TRAYS/PACK) IMPLANT
NDL HYPO 25X1 1.5 SAFETY (NEEDLE) ×1 IMPLANT
NDL SAFETY ECLIPSE 18X1.5 (NEEDLE) ×1 IMPLANT
NEEDLE HYPO 18GX1.5 SHARP (NEEDLE) ×2
NEEDLE HYPO 25X1 1.5 SAFETY (NEEDLE) ×2 IMPLANT
PACK BASIN DAY SURGERY FS (CUSTOM PROCEDURE TRAY) ×2 IMPLANT
PENCIL SMOKE EVACUATOR (MISCELLANEOUS) ×2 IMPLANT
PIN SAFETY STERILE (MISCELLANEOUS) IMPLANT
SIZER BREAST REUSE 380CC (SIZER) ×2
SIZER BREAST REUSE 415CC (SIZER) ×2
SIZER BRST REUSE 380CC (SIZER) ×1 IMPLANT
SIZER BRST REUSE P5.8XHI 415CC (SIZER) ×1 IMPLANT
SLEEVE SCD COMPRESS KNEE MED (MISCELLANEOUS) ×2 IMPLANT
SPONGE LAP 18X18 RF (DISPOSABLE) ×4 IMPLANT
STRIP SUTURE WOUND CLOSURE 1/2 (MISCELLANEOUS) IMPLANT
SUT MNCRL AB 4-0 PS2 18 (SUTURE) ×6 IMPLANT
SUT MON AB 3-0 SH 27 (SUTURE) ×14
SUT MON AB 3-0 SH27 (SUTURE) ×4 IMPLANT
SUT MON AB 5-0 PS2 18 (SUTURE) ×4 IMPLANT
SUT PDS AB 2-0 CT2 27 (SUTURE) IMPLANT
SUT VIC AB 3-0 SH 27 (SUTURE) ×2
SUT VIC AB 3-0 SH 27X BRD (SUTURE) IMPLANT
SUT VICRYL 4-0 PS2 18IN ABS (SUTURE) IMPLANT
SYR BULB IRRIGATION 50ML (SYRINGE) ×2 IMPLANT
SYR CONTROL 10ML LL (SYRINGE) ×2 IMPLANT
TOWEL GREEN STERILE FF (TOWEL DISPOSABLE) ×4 IMPLANT
TRAY DSU PREP LF (CUSTOM PROCEDURE TRAY) ×2 IMPLANT
TUBE CONNECTING 20X1/4 (TUBING) ×2 IMPLANT
UNDERPAD 30X36 HEAVY ABSORB (UNDERPADS AND DIAPERS) ×4 IMPLANT
YANKAUER SUCT BULB TIP NO VENT (SUCTIONS) ×2 IMPLANT

## 2019-07-13 NOTE — Anesthesia Procedure Notes (Signed)
Procedure Name: Intubation Date/Time: 07/13/2019 7:43 AM Performed by: Lavonia Dana, CRNA Pre-anesthesia Checklist: Patient identified, Emergency Drugs available, Suction available and Patient being monitored Patient Re-evaluated:Patient Re-evaluated prior to induction Oxygen Delivery Method: Circle system utilized Preoxygenation: Pre-oxygenation with 100% oxygen Induction Type: IV induction Ventilation: Mask ventilation without difficulty Laryngoscope Size: Miller and 2 Grade View: Grade I Tube type: Oral Tube size: 7.0 mm Number of attempts: 1 Airway Equipment and Method: Stylet and Oral airway Placement Confirmation: ETT inserted through vocal cords under direct vision,  positive ETCO2 and breath sounds checked- equal and bilateral Secured at: 23 cm Tube secured with: Tape Dental Injury: Teeth and Oropharynx as per pre-operative assessment

## 2019-07-13 NOTE — Anesthesia Postprocedure Evaluation (Signed)
Anesthesia Post Note  Patient: Oncologist  Procedure(s) Performed: REMOVAL OF BILATERAL TISSUE EXPANDERS WITH PLACEMENT OF BILATERAL BREAST IMPLANTS (Bilateral Breast)     Patient location during evaluation: PACU Anesthesia Type: General Level of consciousness: awake Pain management: pain level controlled Vital Signs Assessment: post-procedure vital signs reviewed and stable Respiratory status: spontaneous breathing Cardiovascular status: stable Postop Assessment: no apparent nausea or vomiting Anesthetic complications: no    Last Vitals:  Vitals:   07/13/19 0955 07/13/19 0957  BP:  108/65  Pulse: (!) 104 98  Resp:    Temp:  36.7 C  SpO2: 95% 100%    Last Pain:  Vitals:   07/13/19 0957  TempSrc: Oral  PainSc:                  Alice Davis

## 2019-07-13 NOTE — Op Note (Signed)
Op report Bilateral Exchange   DATE OF OPERATION: 07/13/2019  LOCATION: [facility]  SURGICAL DIVISION: Plastic Surgery  PREOPERATIVE DIAGNOSES:  1. History of breast cancer.  2. Acquired absence of bilateral breast.   POSTOPERATIVE DIAGNOSES:  1. History of breast cancer.  2. Acquired absence of bilateral breast.   PROCEDURE:  1. Bilateral exchange of tissue expanders for implants.  2. Bilateral capsulotomies for implant respositioning.  SURGEON: Amerah Puleo Sanger Lamonte Hartt, DO  ASSISTANT: Phoebe Sharps, PA  ANESTHESIA:  General.   COMPLICATIONS: None.   IMPLANTS: Left - [employer]or Smooth Round High Profile Xtra Gel 415cc. Ref #[id number].  Serial Number [id number] - Mentor Smooth Round High Profile Xtra Gel 415cc. Ref #[id number].  Serial Number [id number]  INDICATIONS FOR PROCEDURE:  The patient, Alice Davis, is a 49 y.o. female born on 1970/07/12, is here for treatment after bilateral mastectomies.  She had tissue expanders placed at the time of mastectomies. She now presents for exchange of her expanders for implants.  She requires capsulotomies to better position the implants. MRN: [id number]  CONSENT:  Informed consent was obtained directly from the patient. Risks, benefits and alternatives were fully discussed. Specific risks including but not limited to bleeding, infection, hematoma, seroma, scarring, pain, implant infection, implant extrusion, capsular contracture, asymmetry, wound healing problems, and need for further surgery were all discussed. The patient did have an ample opportunity to have her questions answered to her satisfaction.   DESCRIPTION OF PROCEDURE:  The patient was taken to the operating room. SCDs were placed and IV antibiotics were given. The patient's chest was prepped and draped in a sterile fashion. A time out was performed and the implants to be used were identified.    On the right breast: One percent Lidocaine with  epinephrine was used to infiltrate at the incision site. The old mastectomy scar was incised.  The mastectomy flaps from the superior and inferior flaps were raised over the Flex HD and capsule for 1 centimeter to minimize tension for the closure. The ADM and capsule was split inferior to the skin incision to expose and remove the tissue expander.  Inspection of the pocket showed a normal healthy capsule and good integration of the biologic matrix.  The pocket was irrigated with antibiotic solution.  Measurements were made and a sizer used to confirm adequate pocket size for the implant dimensions.  The 380 cc was slightly small so we went with the 415 cc. Hemostasis was ensured with electrocautery. New gloves were placed. The implant was soaked in antibiotic solution and then placed in the pocket and oriented appropriately. The capsule was re-closed with a 3-0 Monocryl suture. The remaining skin was closed with 4-0 Monocryl deep dermal and 5-0 Monocryl subcuticular stitches.   On the left breast: The old mastectomy scar was incised.  The mastectomy flaps from the superior and inferior flaps were raised over the Flex HD and capsule 1 centimeter to minimize tension for the closure. The capsule was split inferior to the skin incision to expose and remove the tissue expander.  Inspection of the pocket showed a normal healthy capsule and good integration of the biologic matrix.   Superior capsulotomies were performed to allow for breast pocket expansion.  Measurements were made and a sizer utilized to confirm adequate pocket size for the implant dimensions.  Hemostasis was ensured with the electrocautery.  New gloves were applied. The implant was soaked in antibiotic solution and placed in the pocket and oriented appropriately. The ADM  and capsule was re-closed with a 3-0 Monocryl suture. The remaining skin was closed with 4-0 Monocryl deep dermal and 5-0 Monocryl subcuticular stitches.  Dermabond was applied to  the incision site. A breast binder and ABDs were placed.  The patient was allowed to wake from anesthesia and taken to the recovery room in satisfactory condition.   The advanced practice practitioner (APP) assisted throughout the case.  The APP was essential in retraction and counter traction when needed to make the case progress smoothly.  This retraction and assistance made it possible to see the tissue plans for the procedure.  The assistance was needed for blood control, tissue re-approximation and assisted with closure of the incision site.  The [place] was signed into law in 2016 which includes the topic of electronic health records.  This provides immediate access to information in MyChart.  This includes consultation notes, operative notes, office notes, lab results and pathology reports.  If you have any questions about what you read please let us know at your next visit or call us at the office.  We are right here with you.

## 2019-07-13 NOTE — Discharge Instructions (Addendum)
INSTRUCTIONS FOR AFTER SURGERY   You will likely have some questions about what to expect following your operation.  The following information will help you and your family understand what to expect when you are discharged from the hospital.  Following these guidelines will help ensure a smooth recovery and reduce risks of complications.  Postoperative instructions include information on: diet, wound care, medications and physical activity.  AFTER SURGERY Expect to go home after the procedure.  In some cases, you may need to spend one night in the hospital for observation.  DIET This surgery does not require a specific diet.  However, I have to mention that the healthier you eat the better your body can start healing. It is important to increasing your protein intake.  This means limiting the foods with added sugar.  Focus on fruits and vegetables and some meat.  If you have any liposuction during your procedure be sure to drink water.  If your urine is bright yellow, then it is concentrated, and you need to drink more water.  As a general rule after surgery, you should have 8 ounces of water every hour while awake.  If you find you are persistently nauseated or unable to take in liquids let us know.  NO TOBACCO USE or EXPOSURE.  This will slow your healing process and increase the risk of a wound.  WOUND CARE If you don't have a drain: You can shower the day after surgery.  Use fragrance free soap.  Dial, Dove, Ivory and Cetaphil are usually mild on the skin.  If you have steri-strips / tape directly attached to your skin leave them in place. It is OK to get these wet.  No baths, pools or hot tubs for two weeks. We close your incision to leave the smallest and best-looking scar. No ointment or creams on your incisions until given the go ahead.  Especially not Neosporin (Too many skin reactions with this one).  A few weeks after surgery you can use Mederma and start massaging the scar. We ask you to  wear your binder or sports bra for the first 6 weeks around the clock, including while sleeping. This provides added comfort and helps reduce the fluid accumulation at the surgery site.  ACTIVITY No heavy lifting until cleared by the doctor.  It is OK to walk and climb stairs. In fact, moving your legs is very important to decrease your risk of a blood clot.  It will also help keep you from getting deconditioned.  Every 1 to 2 hours get up and walk for 5 minutes. This will help with a quicker recovery back to normal.  Let pain be your guide so you don't do too much.  NO, you cannot do the spring cleaning and don't plan on taking care of anyone else.  This is your time for TLC.   WORK Everyone returns to work at different times. As a rough guide, most people take at least 1 - 2 weeks off prior to returning to work. If you need documentation for your job, bring the forms to your postoperative follow up visit.  DRIVING Arrange for someone to bring you home from the hospital.  You may be able to drive a few days after surgery but not while taking any narcotics or valium.  BOWEL MOVEMENTS Constipation can occur after anesthesia and while taking pain medication.  It is important to stay ahead for your comfort.  We recommend taking Milk of Magnesia (2 tablespoons; twice   a day) while taking the pain pills.  SEROMA This is fluid your body tried to put in the surgical site.  This is normal but if it creates excessive pain and swelling let us know.  It usually decreases in a few weeks.  MEDICATIONS and PAIN CONTROL At your preoperative visit for you history and physical you were given the following medications: 1. An antibiotic: Start this medication when you get home and take according to the instructions on the bottle. 2. Zofran 4 mg:  This is to treat nausea and vomiting.  You can take this every 6 hours as needed and only if needed. 3. Norco (hydrocodone/acetaminophen) 5/325 mg:  This is only to be  used after you have taken the motrin or the tylenol. Every 8 hours as needed. Over the counter Medication to take: 4. Ibuprofen (Motrin) 600 mg:  Take this every 6 hours.  If you have additional pain then take 500 mg of the tylenol.  Only take the Norco after you have tried these two. 5. Miralax or stool softener of choice: Take this according to the bottle if you take the Norco.  WHEN TO CALL Call your surgeon's office if any of the following occur: . Fever 101 degrees F or greater . Excessive bleeding or fluid from the incision site. . Pain that increases over time without aid from the medications . Redness, warmth, or pus draining from incision sites . Persistent nausea or inability to take in liquids . Severe misshapen area that underwent the operation.   Post Anesthesia Home Care Instructions  Activity: Get plenty of rest for the remainder of the day. A responsible individual must stay with you for 24 hours following the procedure.  For the next 24 hours, DO NOT: -Drive a car -Operate machinery -Drink alcoholic beverages -Take any medication unless instructed by your physician -Make any legal decisions or sign important papers.  Meals: Start with liquid foods such as gelatin or soup. Progress to regular foods as tolerated. Avoid greasy, spicy, heavy foods. If nausea and/or vomiting occur, drink only clear liquids until the nausea and/or vomiting subsides. Call your physician if vomiting continues.  Special Instructions/Symptoms: Your throat may feel dry or sore from the anesthesia or the breathing tube placed in your throat during surgery. If this causes discomfort, gargle with warm salt water. The discomfort should disappear within 24 hours.  If you had a scopolamine patch placed behind your ear for the management of post- operative nausea and/or vomiting:  1. The medication in the patch is effective for 72 hours, after which it should be removed.  Wrap patch in a tissue and  discard in the trash. Wash hands thoroughly with soap and water. 2. You may remove the patch earlier than 72 hours if you experience unpleasant side effects which may include dry mouth, dizziness or visual disturbances. 3. Avoid touching the patch. Wash your hands with soap and water after contact with the patch.     

## 2019-07-13 NOTE — Transfer of Care (Signed)
Immediate Anesthesia Transfer of Care Note  Patient: Alice Davis  Procedure(s) Performed: REMOVAL OF BILATERAL TISSUE EXPANDERS WITH PLACEMENT OF BILATERAL BREAST IMPLANTS (Bilateral Breast)  Patient Location: PACU  Anesthesia Type:General  Level of Consciousness: sedated  Airway & Oxygen Therapy: Patient Spontanous Breathing and Patient connected to face mask oxygen  Post-op Assessment: Report given to RN and Post -op Vital signs reviewed and stable  Post vital signs: Reviewed and stable  Last Vitals:  Vitals Value Taken Time  BP 112/64 07/13/19 0854  Temp    Pulse 114 07/13/19 0858  Resp 21 07/13/19 0858  SpO2 100 % 07/13/19 0858  Vitals shown include unvalidated device data.  Last Pain:  Vitals:   07/13/19 0653  TempSrc: Oral  PainSc:       Patients Stated Pain Goal: 5 ([id number])  Complications: No apparent anesthesia complications

## 2019-07-13 NOTE — Interval H&P Note (Signed)
History and Physical Interval Note:  07/13/2019 7:09 AM  Alice Davis  has presented today for surgery, with the diagnosis of History of breast cancer.  The various methods of treatment have been discussed with the patient and family. After consideration of risks, benefits and other options for treatment, the patient has consented to  Procedure(s): REMOVAL OF BILATERAL TISSUE EXPANDERS WITH PLACEMENT OF BILATERAL BREAST IMPLANTS (Bilateral) as a surgical intervention.  The patient's history has been reviewed, patient examined, no change in status, stable for surgery.  I have reviewed the patient's chart and labs.  Questions were answered to the patient's satisfaction.     Loel Lofty Faige Seely

## 2019-07-14 ENCOUNTER — Encounter: Payer: Self-pay | Admitting: *Deleted

## 2019-07-21 ENCOUNTER — Ambulatory Visit (INDEPENDENT_AMBULATORY_CARE_PROVIDER_SITE_OTHER): Payer: BC Managed Care – PPO | Admitting: Plastic Surgery

## 2019-07-21 ENCOUNTER — Encounter: Payer: Self-pay | Admitting: Plastic Surgery

## 2019-07-21 ENCOUNTER — Other Ambulatory Visit: Payer: Self-pay

## 2019-07-21 VITALS — BP 106/67 | HR 91 | Temp 98.4°F | Ht 67.0 in | Wt 143.2 lb

## 2019-07-21 DIAGNOSIS — Z170 Estrogen receptor positive status [ER+]: Secondary | ICD-10-CM

## 2019-07-21 DIAGNOSIS — Z9889 Other specified postprocedural states: Secondary | ICD-10-CM | POA: Insufficient documentation

## 2019-07-21 DIAGNOSIS — Z9013 Acquired absence of bilateral breasts and nipples: Secondary | ICD-10-CM

## 2019-07-21 DIAGNOSIS — C50312 Malignant neoplasm of lower-inner quadrant of left female breast: Secondary | ICD-10-CM

## 2019-07-21 NOTE — Progress Notes (Signed)
Subjective:     Patient ID: Alice Davis, female    DOB: 05-11-1970, 49 y.o.   MRN: [id number]  Chief Complaint  Patient presents with  . Breast Cancer    for removal of (B) breast expanders and placement of silicone implants    HPI: The patient is a 49 y.o. female here for follow-up after bilateral exchange of tissue expanders for implants and bilateral capsulotomies for implant repositioning on 07/13/2019 with Dr. Marla Davis.   Patient presents today with her husband.  Reports she is doing very well.  Mild bruising on the inferior aspects of her breast bilaterally.  She denies fever, chest pain, shortness of breath, N/V. BM+.  She denies pain, redness, drainage of her incision areas.  Incisions are healing well bilaterally, C/D/I.  No signs of infection, redness, drainage, seroma/hematoma.  Dermabond is still present.  Mild swelling of breast bilaterally and mild bruising on the inferior aspects of her breast bilaterally.   Reports she is interested in continuing with PT as she was only able to have 1 session prior to her surgery.  She is having limited range of motion on her right side and some continued numbness.  PT referral placed for the cancer center.  She is scheduled for her radiation mapping on March 30 and will commence with radiation shortly after.   Review of Systems  Constitutional: Negative for chills and fever.  HENT: Negative for congestion, sneezing and sore throat.   Respiratory: Negative for cough and shortness of breath.   Cardiovascular: Negative for chest pain.  Gastrointestinal: Negative for constipation, diarrhea, nausea and vomiting.  Skin: Positive for color change (mild bruising on inferior aspect of breasts bilaterally). Negative for pallor and rash.     Objective:   Vital Signs BP 106/67 (BP Location: Right Arm, Patient Position: Sitting, Cuff Size: Normal)   Pulse 91   Temp 98.4 F (36.9 C) (Temporal)   Ht 5\' 7"  (1.702 m)   Wt 143 lb 3.2 oz (65 kg)    LMP 07/12/2019 (Exact Date)   SpO2 98%   BMI 22.43 kg/m  Vital Signs and Nursing Note Reviewed  Physical Exam  Constitutional: She is oriented to person, place, and time and well-developed, well-nourished, and in no distress.  HENT:  Head: Normocephalic and atraumatic.  Eyes: EOM are normal.  Cardiovascular: Normal rate.  Pulmonary/Chest: Effort normal.  Mild bruising inferior portion of bilateral breasts. Incisions c/d/i. Dermabond present.    Musculoskeletal:        General: Normal range of motion.     Cervical back: Normal range of motion.  Neurological: She is alert and oriented to person, place, and time. Gait normal.  Skin: Skin is warm and dry. No rash noted. No erythema. No pallor.  Psychiatric: Mood, memory, affect and judgment normal.      Assessment/Plan:     ICD-10-CM   1. Malignant neoplasm of lower-inner quadrant of left breast in female, estrogen receptor positive ([place])  C50.312    Z17.0   2. S/P breast reconstruction, bilateral  Z98.890   3. Acquired absence of both breasts  Z90.13     Alice Davis is doing very well. Incision healing well, c/d/i. Mild bruising inferior portion of breasts.  She will do her radiation mapping and then start radiation on the left breast.   Continue to wear sports bra 24/7.  Do periodic massages throughout the day pressing down and inward on the breasts bilaterally.  Avoid the pool until 1 month postop.  Avoid the hot tub until more than 2 months postop.  May do chair massages at this time but avoid massages laying on her stomach.  Continue to avoid sleeping on her stomach. May begin using dermabond or silicone once dermabond falls off.   Referal placed for PT.  Follow up in 10 days & 3 months. Call office with any questions/concerns.  The [place] was signed into law in 2016 which includes the topic of electronic health records.  This provides immediate access to information in MyChart.  This includes consultation  notes, operative notes, office notes, lab results and pathology reports.  If you have any questions about what you read please let us know at your next visit or call us at the office.  We are right here with you.   Alice Heads, PA-C 07/21/2019, 1:29 PM

## 2019-07-22 ENCOUNTER — Ambulatory Visit: Payer: BC Managed Care – PPO

## 2019-07-22 DIAGNOSIS — Z9013 Acquired absence of bilateral breasts and nipples: Secondary | ICD-10-CM

## 2019-07-22 DIAGNOSIS — M25612 Stiffness of left shoulder, not elsewhere classified: Secondary | ICD-10-CM

## 2019-07-22 DIAGNOSIS — M25611 Stiffness of right shoulder, not elsewhere classified: Secondary | ICD-10-CM

## 2019-07-22 DIAGNOSIS — R293 Abnormal posture: Secondary | ICD-10-CM

## 2019-07-22 DIAGNOSIS — C50312 Malignant neoplasm of lower-inner quadrant of left female breast: Secondary | ICD-10-CM

## 2019-07-22 DIAGNOSIS — Z170 Estrogen receptor positive status [ER+]: Secondary | ICD-10-CM

## 2019-07-22 NOTE — Therapy (Signed)
[place], [place], [address] Phone: [phone]   Fax:  [phone]  Physical Therapy Evaluation  Patient Details  Name: Alice Davis MRN: [id number] Date of Birth: 23-Dec-1970 Referring Provider (PT): Audelia Hives, DO    Encounter Date: 07/22/2019  PT End of Session - 07/22/19 0954    Visit Number  1    Number of Visits  5    Date for PT Re-Evaluation  08/26/19    PT Start Time  0905    PT Stop Time  0953    PT Time Calculation (min)  48 min    Activity Tolerance  Patient tolerated treatment well    Behavior During Therapy  [facility] for tasks assessed/performed       Past Medical History:  Diagnosis Date  . Asthma   . Cancer [facility])     Past Surgical History:  Procedure Laterality Date  . BREAST RECONSTRUCTION WITH PLACEMENT OF TISSUE EXPANDER AND FLEX HD (ACELLULAR HYDRATED DERMIS) Bilateral 05/14/2019   Procedure: IMMEDIATE BREAST RECONSTRUCTION WITH PLACEMENT OF TISSUE EXPANDER AND FLEX HD (ACELLULAR HYDRATED DERMIS);  Surgeon: Wallace Going, DO;  Location: [facility];  Service: Plastics;  Laterality: Bilateral;  . NASAL SINUS SURGERY     20 years ago  . NIPPLE SPARING MASTECTOMY WITH SENTINEL LYMPH NODE BIOPSY Bilateral 05/14/2019   Procedure: BILATERAL NIPPLE SPARING MASTECTOMIES WITH LEFT SENTINEL LYMPH NODE BIOPSY;  Surgeon: [name], MD;  Location: [place];  Service: General;  Laterality: Bilateral;  PEC  . REMOVAL OF BILATERAL TISSUE EXPANDERS WITH PLACEMENT OF BILATERAL BREAST IMPLANTS Bilateral 07/13/2019   Procedure: REMOVAL OF BILATERAL TISSUE EXPANDERS WITH PLACEMENT OF BILATERAL BREAST IMPLANTS;  Surgeon: Wallace Going, DO;  Location: [place];  Service: Plastics;  Laterality: Bilateral;  . RHINOPLASTY     20 years  . WISDOM TOOTH EXTRACTION      There were no vitals filed for this visit.   Subjective Assessment - 07/22/19 0909    Subjective  Pt  states that she has a radiation simulation on 08/04/19. She is not sure yet how long she will be taking radiation. She went [facility] prior to her reconstruction surgery to learn some stretches. She had reconstruction surgery on 07/13/19 and does not have to have chemotherapy.    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    Currently in Pain?  No/denies    Pain Score  0-No pain    Multiple Pain Sites  No         [facility] PT Assessment - 07/22/19 0001      Assessment   Medical Diagnosis  L breast cancer/Bil masectomy     Referring Provider (PT)  Audelia Hives, DO       Precautions   Precaution Comments  cancer and bil masectomy w/reconstruction/radiation      Restrictions   Weight Bearing Restrictions  No      Balance Screen   Has the patient fallen in the past 6 months  No      [place] residence  Prior Function   Level of Independence  Independent      Cognition   Overall Cognitive Status  Within Functional Limits for tasks assessed      Posture/Postural Control   Posture Comments  rounded shoulder      ROM / Strength   AROM / PROM / Strength  AROM      AROM   AROM Assessment Site  Shoulder    Right/Left Shoulder  Right;Left    Right Shoulder Flexion  146 Degrees    Right Shoulder ABduction  139 Degrees    Right Shoulder Internal Rotation  80 Degrees    Right Shoulder External Rotation  70 Degrees    Left Shoulder Flexion  120 Degrees    Left Shoulder ABduction  105 Degrees    Left Shoulder Internal Rotation  10 Degrees    Left Shoulder External Rotation  75 Degrees         LYMPHEDEMA/ONCOLOGY QUESTIONNAIRE - 07/22/19 0937      Type   Cancer Type  Multifocal invasive lobular cancer       Surgeries   Mastectomy Date  05/14/19    Sentinel Lymph Node Biopsy Date  05/14/19    Other Surgery Date  07/13/19   reconstruction   Number Lymph Nodes Removed  3      Treatment   Active Chemotherapy Treatment  No    Past Chemotherapy Treatment  No    Active Radiation Treatment  No    Past Radiation Treatment  No    Current Hormone Treatment  No    Past Hormone Therapy  No      What other symptoms do you have   Are you Having Heaviness or Tightness  No    Are you having Pain  No    Are you having pitting edema  No    Is it Hard or Difficult finding clothes that fit  No    Do you have infections  No    Is there Decreased scar mobility  Yes      Lymphedema Assessments   Lymphedema Assessments  Upper extremities      Right Upper Extremity Lymphedema   15 cm Proximal to Olecranon Process  25 cm    10 cm Proximal to Olecranon Process  24 cm    Olecranon Process  21.5 cm    15 cm Proximal to Ulnar Styloid Process  21.2 cm    10 cm Proximal to Ulnar Styloid Process  18.4 cm    Just Proximal to Ulnar Styloid Process  14.4 cm    Across Hand at [employer]  17.5 cm    At [place] of 2nd Digit  5.4 cm      Left Upper Extremity Lymphedema   15 cm Proximal to Olecranon Process  24.4 cm    10 cm Proximal to Olecranon Process  24 cm    Olecranon Process  21 cm    15 cm Proximal to Ulnar Styloid Process  20.6 cm    10 cm Proximal to Ulnar Styloid Process  18.5 cm    Just Proximal to Ulnar Styloid Process  14 cm    Across Hand at [employer]  16.3 cm    At [place] of 2nd Digit  5 cm             Objective measurements completed on examination: See above findings.              PT  Education - 07/22/19 0953    Education Details  Access Code: [email or web address] pt will work on easy AA/ROM activities. Discussed the importance of going slow and no  aggressive stretching in order to preserve her incisions and prevent cording. pt was educated on risk of lymphedema following 3 lymph node removal and then following radiation. Discussed risk reduction precautions for lymphedema. Pt was educated on the most common positioning for radiation and what type of ROM she may need to comfortabely achieve this position. Pt was educated on cording and how this is treated.    Person(s) Educated  Patient    Methods  Explanation;Demonstration;Handout    Comprehension  Verbalized understanding       PT Short Term Goals - 07/22/19 1000      PT SHORT TERM GOAL #1   Title  Pt will be independent with her initial HEP within 1 weeks to demonstrate authonomy of care.    Baseline  pt does not have an HEP she is currently working on since surgery.    Time  1    Period  Weeks    Status  New    Target Date  08/05/19        PT Long Term Goals - 07/22/19 1000      PT LONG TERM GOAL #1   Title  Pt will improve ROM of the L shoulder to 120 degrees of abduction within 2 weeks for improved mobility for radiation treatment.    Baseline  105 degrees L shoulder abdct    Time  2    Period  Weeks    Status  New    Target Date  08/26/19             Plan - 07/22/19 0955    Clinical Impression Statement  Pt presents to physical therapy following Bil reconstructive surgery on 07/13/19 following a Bil masectomy on 05/14/19 with 3 lymph node removal due to multi focal invastive lobular cancer of the L breast. She is going for her radiation simulation on 08/04/19. She is demonstrating decreased ROM of the Bil shoulders with L >R. No noted differences between R/L with circumferential measurements. Incision look great with no signs /symptoms of infection. One cord noted in the L axilla toward the medial L brachium. Pt will benefit from skilled physical therapy services 2x/week for 2 weeks until she recieves her radiation simulation in order to decrease risk for impingment and  improvement comfort during radiation.    Personal Factors and Comorbidities  Comorbidity 1    Comorbidities  Bil masectomy, recent reconstructive surgery 07/13/19    Examination-Activity Limitations  Bathing;Reach Overhead;Sleep;Carry;Dressing;Lift;Toileting    Stability/Clinical Decision Making  Stable/Uncomplicated    Clinical Decision Making  Low    Rehab Potential  Good    PT Frequency  2x / week    PT Duration  2 weeks    PT Treatment/Interventions  ADLs/Self Care Home Management;Manual techniques;Patient/family education;Neuromuscular re-education;Therapeutic exercise;Therapeutic activities    PT Next Visit Plan  easy AA/ROM, P/ROM, myofascial work    PT Home Exercise Plan  Access Code: [email or web address]    Consulted and Agree with Plan of Care  Patient       Patient will benefit from skilled therapeutic intervention in order to improve the following deficits and impairments:  Decreased range of motion, Decreased knowledge of precautions, Postural dysfunction  Visit Diagnosis: Malignant neoplasm of lower-inner quadrant of left breast in female, estrogen receptor positive ([place])  Acquired absence of both breasts  Stiffness of right shoulder, not elsewhere classified  Stiffness of left shoulder, not elsewhere classified  Abnormal posture     Problem List Patient Active Problem List   Diagnosis Date Noted  . S/P breast reconstruction, bilateral 07/21/2019  . Acquired absence of breast 05/22/2019  . Breast cancer ([place]) 05/14/2019  . Malignant neoplasm of lower-inner quadrant of left breast in female, estrogen receptor positive ([place]) 03/24/2019  . Allergic dermatitis 12/27/2017  . Reactive airway disease without complication 06/29/3610  . Allergic rhinitis 11/28/2016  . Hyperlipidemia 11/28/2016    Ander Purpura, PT 07/22/2019, 10:02 AM  [place], [place], [address] Phone: [phone]   Fax:   [phone]  Name: Kasmira Cacioppo MRN: [id number] Date of Birth: May 07, 1971

## 2019-07-22 NOTE — Patient Instructions (Signed)
Access Code: [email or web address] URL: [email or web address] Date: 07/22/2019 Prepared by: Tomma Rakers  Exercises Supine Shoulder Press with [place] - 2 x daily - 7 x weekly - 1 sets - 10 reps Supine Shoulder Flexion Extension AAROM with Dowel - 2 x daily - 7 x weekly - 1 sets - 10 reps Supine Shoulder External Rotation with Dowel - 2 x daily - 7 x weekly - 1 sets - 10 reps

## 2019-07-27 ENCOUNTER — Other Ambulatory Visit: Payer: Self-pay

## 2019-07-27 ENCOUNTER — Ambulatory Visit: Payer: BC Managed Care – PPO

## 2019-07-27 DIAGNOSIS — Z170 Estrogen receptor positive status [ER+]: Secondary | ICD-10-CM

## 2019-07-27 DIAGNOSIS — M25611 Stiffness of right shoulder, not elsewhere classified: Secondary | ICD-10-CM

## 2019-07-27 DIAGNOSIS — M25612 Stiffness of left shoulder, not elsewhere classified: Secondary | ICD-10-CM

## 2019-07-27 DIAGNOSIS — Z9013 Acquired absence of bilateral breasts and nipples: Secondary | ICD-10-CM

## 2019-07-27 DIAGNOSIS — C50312 Malignant neoplasm of lower-inner quadrant of left female breast: Secondary | ICD-10-CM

## 2019-07-27 DIAGNOSIS — R293 Abnormal posture: Secondary | ICD-10-CM

## 2019-07-27 NOTE — Therapy (Signed)
[place], [place], [address] Phone: [phone]   Fax:  [phone]  Physical Therapy Treatment  Patient Details  Name: Alice Davis MRN: [id number] Date of Birth: 04-Nov-1970 Referring Provider (PT): Audelia Hives, DO    Encounter Date: 07/27/2019  PT End of Session - 07/27/19 0905    Visit Number  2    Number of Visits  5    Date for PT Re-Evaluation  08/26/19    PT Start Time  0806    PT Stop Time  0902    PT Time Calculation (min)  56 min    Activity Tolerance  Patient tolerated treatment well    Behavior During Therapy  [facility] for tasks assessed/performed       Past Medical History:  Diagnosis Date  . Asthma   . Cancer [facility])     Past Surgical History:  Procedure Laterality Date  . BREAST RECONSTRUCTION WITH PLACEMENT OF TISSUE EXPANDER AND FLEX HD (ACELLULAR HYDRATED DERMIS) Bilateral 05/14/2019   Procedure: IMMEDIATE BREAST RECONSTRUCTION WITH PLACEMENT OF TISSUE EXPANDER AND FLEX HD (ACELLULAR HYDRATED DERMIS);  Surgeon: Wallace Going, DO;  Location: [facility];  Service: Plastics;  Laterality: Bilateral;  . NASAL SINUS SURGERY     20 years ago  . NIPPLE SPARING MASTECTOMY WITH SENTINEL LYMPH NODE BIOPSY Bilateral 05/14/2019   Procedure: BILATERAL NIPPLE SPARING MASTECTOMIES WITH LEFT SENTINEL LYMPH NODE BIOPSY;  Surgeon: [name], MD;  Location: [facility];  Service: General;  Laterality: Bilateral;  PEC  . REMOVAL OF BILATERAL TISSUE EXPANDERS WITH PLACEMENT OF BILATERAL BREAST IMPLANTS Bilateral 07/13/2019   Procedure: REMOVAL OF BILATERAL TISSUE EXPANDERS WITH PLACEMENT OF BILATERAL BREAST IMPLANTS;  Surgeon: Wallace Going, DO;  Location: [facility];  Service: Plastics;  Laterality: Bilateral;  . RHINOPLASTY     20 years  . WISDOM TOOTH EXTRACTION      There were no vitals filed for this visit.  Subjective Assessment - 07/27/19 0810    Subjective  I can tell  the cord is trying to resolve. It was down into my Lt forearm, and now just into my upper arm.    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    Currently in Pain?  No/denies                       [facility] Adult PT Treatment/Exercise - 07/27/19 0001      Manual Therapy   Manual Therapy  Myofascial release;Passive ROM    Myofascial Release  Gently to Lt axilla during P/ROM where cording palpable but this improved during session    Passive ROM  In Supine to Lt shoulder into flexion, abduction, D2 and radiation position to pts tolerane             PT Education - 07/27/19 0907    Education Details  Doorway end ROM stretching for Lt shoulder    Person(s) Educated  Patient    Methods  Explanation;Demonstration    Comprehension  Verbalized understanding       PT Short Term Goals - 07/22/19 1000      PT  SHORT TERM GOAL #1   Title  Pt will be independent with her initial HEP within 1 weeks to demonstrate authonomy of care.    Baseline  pt does not have an HEP she is currently working on since surgery.    Time  1    Period  Weeks    Status  New    Target Date  08/05/19        PT Long Term Goals - 07/22/19 1000      PT LONG TERM GOAL #1   Title  Pt will improve ROM of the L shoulder to 120 degrees of abduction within 2 weeks for improved mobility for radiation treatment.    Baseline  105 degrees L shoulder abdct    Time  2    Period  Weeks    Status  New    Target Date  08/26/19            Plan - 07/27/19 0905    Clinical Impression Statement  Pt returns to therapy today after evaluation last week. First session of manual therapy working to decrease  cording in Lt axilla and improve Lt shoulder P/ROM. Pt tolerated all very well reporting feeling good stretches without pain. Also demonstrated doorway end ROM stretching at end of session for pt to add at home.    Personal Factors and Comorbidities  Comorbidity 1    Comorbidities  Bil masectomy, recent reconstructive surgery 07/13/19    Examination-Activity Limitations  Bathing;Reach Overhead;Sleep;Carry;Dressing;Lift;Toileting    Examination-Participation Restrictions  Other    Stability/Clinical Decision Making  Stable/Uncomplicated    Rehab Potential  Good    PT Frequency  2x / week    PT Duration  2 weeks    PT Treatment/Interventions  ADLs/Self Care Home Management;Manual techniques;Patient/family education;Neuromuscular re-education;Therapeutic exercise;Therapeutic activities    PT Next Visit Plan  Cont easy AA/ROM (add pulleys and ball roll up wall), P/ROM, myofascial work    Oncologist with Plan of Care  Patient       Patient will benefit from skilled therapeutic intervention in order to improve the following deficits and impairments:  Decreased range of motion, Decreased knowledge of precautions, Postural dysfunction  Visit Diagnosis: Malignant neoplasm of lower-inner quadrant of left breast in female, estrogen receptor positive ([place])  Acquired absence of both breasts  Stiffness of right shoulder, not elsewhere classified  Stiffness of left shoulder, not elsewhere classified  Abnormal posture     Problem List Patient Active Problem List   Diagnosis Date Noted  . S/P breast reconstruction, bilateral 07/21/2019  . Acquired absence of breast 05/22/2019  . Breast cancer ([place]) 05/14/2019  . Malignant neoplasm of lower-inner quadrant of left breast in female, estrogen receptor positive ([place]) 03/24/2019  . Allergic dermatitis 12/27/2017  . Reactive airway disease without complication 16/60/6004  . Allergic rhinitis 11/28/2016  . Hyperlipidemia 11/28/2016     Otelia Limes, PTA 07/27/2019, 9:08 AM  [place], [place], [address] Phone: [phone]   Fax:  [phone]  Name: Alice Davis MRN: [id number] Date of Birth: 1970-07-31

## 2019-07-28 NOTE — Progress Notes (Signed)
Location of Breast Cancer: Left Breast  Histology per Pathology Report: 03/13/19 Diagnosis 1. 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  Receptor Status: ER(90%), PR (90%), Her2-neu (NEG), Ki-(20%)  2. Breast, left, needle core biopsy, 8 o'clock - INVASIVE MAMMARY CARCINOMA. MAMMARY CARCINOMA IN SITU.  Receptor Status: ER(80%), PR (80%), Her2-neu (NEG), Ki-(5%)  3. Lymph node, needle/core biopsy, left axilla - NODAL TISSUE, NEGATIVE FOR CARCINOMA.  05/14/19  FINAL MICROSCOPIC DIAGNOSIS: A. BREAST, RIGHT RETRO NIPPLE TISSUE, BIOPSY: - Breast tissue, benign. B. BREAST, RIGHT, MASTECTOMY: - Breast tissue, benign. - Biopsy sites. C. BREAST, LEFT RETRO NIPPLE TISSUE, BIOPSY: - Breast tissue, benign. D. LYMPH NODE, SENTINEL, LEFT AXILLARY #1, BIOPSY: - One lymph node, negative for carcinoma (0/1). E. LYMPH NODE, SENTINEL, LEFT AXILLARY #2, BIOPSY: - One lymph node, negative for carcinoma (0/1). F. 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 - Biopsy site. G. BREAST, LEFT INFERIOR LATERAL SUBAREOLAR TISSUE, BIOPSY: - Breast tissue, benign   Did patient present with symptoms or was this found on screening mammography?:It was found on a routine screening mammogram.  Past/Anticipated interventions by surgeon, if any: 05/14/19 Dr. Marlou Starks and Dr. Yates Decamp PROCEDURE:Procedure(s) with comments: BILATERAL NIPPLE SPARING MASTECTOMIES WITH LEFT SENTINEL LYMPH NODE BIOPSY (Bilateral) - PEC SURGEON: Surgeon(s) and Role: Panel 1: [name], MD - Primary  PROCEDURE:  1.Bilateralimmediate breast reconstruction with placement of Acellular Dermal Matrix and tissue expanders. SURGEON:Claire Sanger Dillingham, DO  07/13/19 PROCEDURE:  1. Bilateral exchange of tissue expanders for implants.  2. Bilateral capsulotomies for implant  respositioning. SURGEON: Lyndee Leo Sanger Dillingham, DO   Past/Anticipated interventions by medical oncology, if any: 05/21/19 Dr. Lindi Adie Recommendation: 1.MammaPrint testing to determine benefit from chemotherapy 2.Adjuvant radiation therapy followed by 3.Adjuvant antiestrogen therapy: We discussed tamoxifen for 10 years versus tamoxifen for 3 years followed by aromatase inhibitor for 5 years if she is menopausal.  Breast navigator documented 06/04/19: Received mammaprint results of low risk. Patient is aware. Message sent for Dr. Isidore Moos appt.  Lymphedema issues, if [id number] swollen around left breast. Currently doing PT (twice a week for 3 weeks to help with cording and range of motion; final session tomorrow 08/05/19)  Pain issues, if YEB:[id number] from PT/stretching to left arm  SAFETY ISSUES:  Prior radiation?No  Pacemaker/ICD?No  Possiblecurrent pregnancy?Her husband has had a vasectomy.She last had a negative pregnancy test on 07/13/19.  Is the patient on methotrexate?No  Current Complaints / other details:

## 2019-07-29 ENCOUNTER — Ambulatory Visit: Payer: BC Managed Care – PPO

## 2019-07-29 ENCOUNTER — Other Ambulatory Visit: Payer: Self-pay

## 2019-07-29 DIAGNOSIS — C50312 Malignant neoplasm of lower-inner quadrant of left female breast: Secondary | ICD-10-CM

## 2019-07-29 DIAGNOSIS — M25611 Stiffness of right shoulder, not elsewhere classified: Secondary | ICD-10-CM

## 2019-07-29 DIAGNOSIS — Z170 Estrogen receptor positive status [ER+]: Secondary | ICD-10-CM

## 2019-07-29 DIAGNOSIS — R293 Abnormal posture: Secondary | ICD-10-CM | POA: Diagnosis not present

## 2019-07-29 DIAGNOSIS — Z9013 Acquired absence of bilateral breasts and nipples: Secondary | ICD-10-CM

## 2019-07-29 DIAGNOSIS — M25612 Stiffness of left shoulder, not elsewhere classified: Secondary | ICD-10-CM

## 2019-07-29 NOTE — Therapy (Signed)
[place], [place], [address] Phone: [phone]   Fax:  [phone]  Physical Therapy Treatment  Patient Details  Name: Alice Davis MRN: [id number] Date of Birth: 01-24-1971 Referring Provider (PT): Alice Hives, DO    Encounter Date: 07/29/2019  PT End of Session - 07/29/19 0902    Visit Number  3    Number of Visits  5    Date for PT Re-Evaluation  08/26/19    PT Start Time  0802    PT Stop Time  0901    PT Time Calculation (min)  59 min    Activity Tolerance  Patient tolerated treatment well    Behavior During Therapy  [facility] for tasks assessed/performed       Past Medical History:  Diagnosis Date  . Asthma   . Cancer [facility])     Past Surgical History:  Procedure Laterality Date  . BREAST RECONSTRUCTION WITH PLACEMENT OF TISSUE EXPANDER AND FLEX HD (ACELLULAR HYDRATED DERMIS) Bilateral 05/14/2019   Procedure: IMMEDIATE BREAST RECONSTRUCTION WITH PLACEMENT OF TISSUE EXPANDER AND FLEX HD (ACELLULAR HYDRATED DERMIS);  Surgeon: Alice Going, DO;  Location: [place];  Service: Plastics;  Laterality: Bilateral;  . NASAL SINUS SURGERY     20 years ago  . NIPPLE SPARING MASTECTOMY WITH SENTINEL LYMPH NODE BIOPSY Bilateral 05/14/2019   Procedure: BILATERAL NIPPLE SPARING MASTECTOMIES WITH LEFT SENTINEL LYMPH NODE BIOPSY;  Surgeon: Alice Kussmaul, MD;  Location: [place];  Service: General;  Laterality: Bilateral;  PEC  . REMOVAL OF BILATERAL TISSUE EXPANDERS WITH PLACEMENT OF BILATERAL BREAST IMPLANTS Bilateral 07/13/2019   Procedure: REMOVAL OF BILATERAL TISSUE EXPANDERS WITH PLACEMENT OF BILATERAL BREAST IMPLANTS;  Surgeon: Alice Going, DO;  Location: [place];  Service: Plastics;  Laterality: Bilateral;  . RHINOPLASTY     20 years  . WISDOM TOOTH EXTRACTION      There were no vitals filed for this visit.  Subjective Assessment - 07/29/19 0805    Subjective  My Lt  shoulder felt good after last session that day, but it was a little sore the next morning so I used my heating pad and it felt fine after that.    Pertinent History  January 7th Bil Masectomy with 3 lymph node removal,Left mastectomy: Multifocal invasive lobular cancer largest focus 2.5 cm, grade 2, margins negative, 1/3 lymph node positive ER 80-95 %, PR 80-95 %, HER-2 negative, Ki-67 2-20%right mastectomy: Benign; then bil implant exchange July 13, 2019    Patient Stated Goals  I want to get rid of my cord and get my arm movement back.    Currently in Pain?  No/denies                       [facility] Adult PT Treatment/Exercise - 07/29/19 0001      Shoulder Exercises: Pulleys   Flexion  2 minutes    Flexion Limitations  Pt returned therapist demo    ABduction  2 minutes    ABduction Limitations  Pt returned therapist demo then tactile cues to decrease Lt scapular compensation      Shoulder Exercises: Therapy Ball   Flexion  Both;10 reps   Forward lean into end of stretch   ABduction  Right;Left;5 reps   same side lean into end of each stretch     Shoulder Exercises: Stretch   Other Shoulder Stretches  Modified downward dog on wall 5x, 5 sec holds  returning therapist      Manual Therapy   Manual Therapy  Myofascial release;Passive ROM    Myofascial Release  Gently to Lt axilla during P/ROM where cording palpable but this improved during session    Passive ROM  In Supine to Lt shoulder into flexion, abduction, D2 and radiation position to pts tolerane               PT Short Term Goals - 07/22/19 1000      PT SHORT TERM GOAL #1   Title  Pt will be independent with her initial HEP within 1 weeks to demonstrate authonomy of care.    Baseline  pt does not have an HEP she is currently working on since surgery.    Time  1    Period  Weeks    Status  New    Target Date  08/05/19        PT Long Term Goals - 07/22/19 1000      PT LONG TERM GOAL #1   Title  Pt  will improve ROM of the L shoulder to 120 degrees of abduction within 2 weeks for improved mobility for radiation treatment.    Baseline  105 degrees L shoulder abdct    Time  2    Period  Weeks    Status  New    Target Date  08/26/19            Plan - 07/29/19 1030    Clinical Impression Statement  Pt reports did well after first session of stretching last time, though just had some soreness the next morning. Progressed exercises to include AA/ROM which she tolerated very well. Assessed inferior to Lt breast where pt c/o intermittent sharp pain when she coughs. No cording noticed, but a few stitches seem palpable that are working out from the skin, so educated pt about this and instructed to leave them alone and address with doctor Friday at appointment. She verbalized understanding.    Personal Factors and Comorbidities  Comorbidity 1    Comorbidities  Bil masectomy, recent reconstructive surgery 07/13/19    Examination-Activity Limitations  Bathing;Reach Overhead;Sleep;Carry;Dressing;Lift;Toileting    Examination-Participation Restrictions  Other    Stability/Clinical Decision Making  Stable/Uncomplicated    Rehab Potential  Good    PT Frequency  2x / week    PT Duration  2 weeks    PT Treatment/Interventions  ADLs/Self Care Home Management;Manual techniques;Patient/family education;Neuromuscular re-education;Therapeutic exercise;Therapeutic activities    PT Next Visit Plan  Cont easy AA/ROM (cont pulleys and ball roll up wall), P/ROM, myofascial work    Oncologist with Plan of Care  Patient       Patient will benefit from skilled therapeutic intervention in order to improve the following deficits and impairments:  Decreased range of motion, Decreased knowledge of precautions, Postural dysfunction  Visit Diagnosis: Malignant neoplasm of lower-inner quadrant of left breast in female, estrogen receptor positive (Alice Davis)  Acquired absence of both breasts  Stiffness of right  shoulder, not elsewhere classified  Stiffness of left shoulder, not elsewhere classified  Abnormal posture     Problem List Patient Active Problem List   Diagnosis Date Noted  . S/P breast reconstruction, bilateral 07/21/2019  . Acquired absence of breast 05/22/2019  . Breast cancer ([place]) 05/14/2019  . Malignant neoplasm of lower-inner quadrant of left breast in female, estrogen receptor positive ([place]) 03/24/2019  . Allergic dermatitis 12/27/2017  . Reactive airway disease without complication 83/66/2947  . Allergic  rhinitis 11/28/2016  . Hyperlipidemia 11/28/2016    Otelia Limes, PTA 07/29/2019, 10:35 AM  [place], [place], [address] Phone: [phone]   Fax:  [phone]  Name: Alice Davis MRN: [id number] Date of Birth: [birth year]

## 2019-07-30 NOTE — Progress Notes (Signed)
Patient is a 49 year old female here for follow-up after bilateral exchange of tissue expanders for implants and bilateral capsulotomies for implant repositioning on 07/13/2019 with Dr. Marla Roe.  Alice Davis reports she is doing well.  Incisions are healing nicely, C/D/I. No sings of infection, drainage, seroma/hematoma. Very small amount of rash along the lateral end of the incisions. Removed suture knots today.  Denies itching, breast pain, fever, chest pain, shortness of breath, N/V. Has been doing PT and reports she feels it's helping improve her ROM.   Continue to wear sports bra 24/7 until 6 weeks postop.  Then can wear sports bra just during the day.  Continue to do periodic massages throughout the day pressing down and inward on the breast bilaterally. Take a benadryl at night for rash. (Already take daily zyrtec). Keep area dry.   She goes for Radiation mapping next week and then will start radiation.  Follow up 3 months post-op. Call office with any questions/concerns or if rash increases.   The [place] was signed into law in 2016 which includes the topic of electronic health records.  This provides immediate access to information in MyChart.  This includes consultation notes, operative notes, office notes, lab results and pathology reports.  If you have any questions about what you read please let us know at your next visit or call us at the office.  We are right here with you.

## 2019-07-31 ENCOUNTER — Ambulatory Visit (INDEPENDENT_AMBULATORY_CARE_PROVIDER_SITE_OTHER): Payer: BC Managed Care – PPO | Admitting: Plastic Surgery

## 2019-07-31 ENCOUNTER — Encounter: Payer: Self-pay | Admitting: Plastic Surgery

## 2019-07-31 ENCOUNTER — Other Ambulatory Visit: Payer: Self-pay

## 2019-07-31 VITALS — BP 96/68 | HR 103 | Temp 97.1°F | Ht 67.0 in | Wt 143.0 lb

## 2019-07-31 DIAGNOSIS — Z9889 Other specified postprocedural states: Secondary | ICD-10-CM

## 2019-08-03 ENCOUNTER — Ambulatory Visit: Payer: BC Managed Care – PPO

## 2019-08-03 ENCOUNTER — Other Ambulatory Visit: Payer: Self-pay

## 2019-08-03 DIAGNOSIS — Z9013 Acquired absence of bilateral breasts and nipples: Secondary | ICD-10-CM

## 2019-08-03 DIAGNOSIS — M25611 Stiffness of right shoulder, not elsewhere classified: Secondary | ICD-10-CM

## 2019-08-03 DIAGNOSIS — R293 Abnormal posture: Secondary | ICD-10-CM

## 2019-08-03 DIAGNOSIS — M25612 Stiffness of left shoulder, not elsewhere classified: Secondary | ICD-10-CM

## 2019-08-03 DIAGNOSIS — Z170 Estrogen receptor positive status [ER+]: Secondary | ICD-10-CM

## 2019-08-03 DIAGNOSIS — C50312 Malignant neoplasm of lower-inner quadrant of left female breast: Secondary | ICD-10-CM

## 2019-08-03 NOTE — Therapy (Signed)
[place], [place], [address] Phone: [phone]   Fax:  [phone]  Physical Therapy Treatment  Patient Details  Name: Alice Davis MRN: [id number] Date of Birth: 18-Jun-1970 Referring Provider (PT): Audelia Hives, DO    Encounter Date: 08/03/2019  PT End of Session - 08/03/19 0808    Visit Number  4    Number of Visits  5    Date for PT Re-Evaluation  08/26/19    PT Start Time  0805    PT Stop Time  0858    PT Time Calculation (min)  53 min    Activity Tolerance  Patient tolerated treatment well    Behavior During Therapy  [facility] for tasks assessed/performed       Past Medical History:  Diagnosis Date  . Asthma   . Cancer [facility])     Past Surgical History:  Procedure Laterality Date  . BREAST RECONSTRUCTION WITH PLACEMENT OF TISSUE EXPANDER AND FLEX HD (ACELLULAR HYDRATED DERMIS) Bilateral 05/14/2019   Procedure: IMMEDIATE BREAST RECONSTRUCTION WITH PLACEMENT OF TISSUE EXPANDER AND FLEX HD (ACELLULAR HYDRATED DERMIS);  Surgeon: Wallace Going, DO;  Location: [place];  Service: Plastics;  Laterality: Bilateral;  . NASAL SINUS SURGERY     20 years ago  . NIPPLE SPARING MASTECTOMY WITH SENTINEL LYMPH NODE BIOPSY Bilateral 05/14/2019   Procedure: BILATERAL NIPPLE SPARING MASTECTOMIES WITH LEFT SENTINEL LYMPH NODE BIOPSY;  Surgeon: [name], MD;  Location: [place];  Service: General;  Laterality: Bilateral;  PEC  . REMOVAL OF BILATERAL TISSUE EXPANDERS WITH PLACEMENT OF BILATERAL BREAST IMPLANTS Bilateral 07/13/2019   Procedure: REMOVAL OF BILATERAL TISSUE EXPANDERS WITH PLACEMENT OF BILATERAL BREAST IMPLANTS;  Surgeon: Wallace Going, DO;  Location: [place];  Service: Plastics;  Laterality: Bilateral;  . RHINOPLASTY     20 years  . WISDOM TOOTH EXTRACTION      There were no vitals filed for this visit.  Subjective Assessment - 08/03/19 0807    Subjective  Pt reports  that she has one spot that is under and on the side of her L breast that is tender to the touch or if she moves it in a certain spot.    Pertinent History  January 7th Bil Masectomy with 3 lymph node removal,Left mastectomy: Multifocal invasive lobular cancer largest focus 2.5 cm, grade 2, margins negative, 1/3 lymph node positive ER 80-95 %, PR 80-95 %, HER-2 negative, Ki-67 2-20%right mastectomy: Benign; then bil implant exchange July 13, 2019    Patient Stated Goals  I want to get rid of my cord and get my arm movement back.    Currently in Pain?  No/denies    Pain Score  0-No pain                       [facility] Adult PT Treatment/Exercise - 08/03/19 0001      Manual Therapy   Manual Therapy  Myofascial release;Soft tissue mobilization;Manual Lymphatic Drainage (MLD);Passive ROM    Soft tissue mobilization  middle anterior scalenes and the L wing of the latissimus near insertion; slight decrease in tightness following light STM.     Myofascial Release  Longitudinally along the L lateral trunk wall to the L axilla, L axilla to the medial L brachium and L axilla to inferior breast (multiple pops noted), across the anterior/superior L breast into the anterior deltoid for myofascial release due to tightness noted.  Manual Lymphatic Drainage (MLD)  In supine prior to and after myofascial release STM: short neck, swimming in the terminus, Bil shoulder collectors, Bil axillary and inguinal nodes, L axillo-ingu9nal anastomosis then re-worked after myofascial and STM.     Passive ROM  In supine into flexion, abduction and external rotation w/abduction for correct position needed for radiation.              PT Education - 08/03/19 0902    Education Details  Pt will continue working on exercises at home. Her and her spouse will work on myofascial release. Pt spouse was educated on how to perform myofascial release over tight skin in the axillary/medial brachial area discussing just  working to skin stretch, no forcing and don't let hands slide to avoid pinching. Pt and spouse were educated on going to pt tolerance. Pt spouse performed return demonstration with good form.    Person(s) Educated  Patient;Spouse    Methods  Explanation;Demonstration    Comprehension  Verbalized understanding;Returned demonstration       PT Short Term Goals - 07/22/19 1000      PT SHORT TERM GOAL #1   Title  Pt will be independent with her initial HEP within 1 weeks to demonstrate authonomy of care.    Baseline  pt does not have an HEP she is currently working on since surgery.    Time  1    Period  Weeks    Status  New    Target Date  08/05/19        PT Long Term Goals - 07/22/19 1000      PT LONG TERM GOAL #1   Title  Pt will improve ROM of the L shoulder to 120 degrees of abduction within 2 weeks for improved mobility for radiation treatment.    Baseline  105 degrees L shoulder abdct    Time  2    Period  Weeks    Status  New    Target Date  08/26/19            Plan - 08/03/19 0805    Clinical Impression Statement  MLD was performed prior to and after myofascial release and STM this session due to small pocket of fluid noted at the L lateral breast. She was provided with a small 1/2 inch gray foam for the lateral trunk area nera incision on the L breast. Myofascial release was performed with multiple pops noted at the L lateral and inferior breast where pt was reporting pain occasionally; her spouse was educated how to perform myofascial relesae with good return. Pt will benefit from continued POC at this time.    Personal Factors and Comorbidities  Comorbidity 1    Comorbidities  Bil masectomy, recent reconstructive surgery 07/13/19    Examination-Activity Limitations  Bathing;Reach Overhead;Sleep;Carry;Dressing;Lift;Toileting    Examination-Participation Restrictions  Other    Rehab Potential  Good    PT Frequency  2x / week    PT Duration  2 weeks    PT  Treatment/Interventions  ADLs/Self Care Home Management;Manual techniques;Patient/family education;Neuromuscular re-education;Therapeutic exercise;Therapeutic activities    PT Next Visit Plan  SOZO please, scapular series with yellow band, discharge? and ask how foam worked, continue with myofascial release with time permitting ask how it is going at home.    PT Home Exercise Plan  Access Code: [email or web address]    [email or web address] and Agree with Plan of Care  Patient       Patient will benefit from skilled  therapeutic intervention in order to improve the following deficits and impairments:  Decreased range of motion, Decreased knowledge of precautions, Postural dysfunction  Visit Diagnosis: Acquired absence of both breasts  Malignant neoplasm of lower-inner quadrant of left breast in female, estrogen receptor positive ([facility])  Stiffness of right shoulder, not elsewhere classified  Stiffness of left shoulder, not elsewhere classified  Abnormal posture     Problem List Patient Active Problem List   Diagnosis Date Noted  . S/P breast reconstruction, bilateral 07/21/2019  . Acquired absence of breast 05/22/2019  . Breast cancer ([place]) 05/14/2019  . Malignant neoplasm of lower-inner quadrant of left breast in female, estrogen receptor positive ([place]) 03/24/2019  . Allergic dermatitis 12/27/2017  . Reactive airway disease without complication 02/02/5746  . Allergic rhinitis 11/28/2016  . Hyperlipidemia 11/28/2016    Ander Purpura, PT 08/03/2019, 9:06 AM  [place], [place], [address] Phone: [phone]   Fax:  [phone]  Name: Alice Davis MRN: [id number] Date of Birth: Dec 18, 1970

## 2019-08-03 NOTE — Progress Notes (Signed)
Radiation Oncology         [phone]) [phone] ________________________________  Name: Alice Davis MRN: [id number]  Date: 08/04/2019  DOB: Oct 23, 1970  Follow-Up Visit Note in person  Outpatient  CC: [name], MD  [place]illingham, Loel Lofty, DO  Diagnosis:      ICD-10-CM   1. Malignant neoplasm of lower-inner quadrant of left breast in female, estrogen receptor positive ([place])  C50.312 Pregnancy, urine   Z17.0      Cancer Staging Malignant neoplasm of lower-inner quadrant of left breast in female, estrogen receptor positive ([place]) Staging form: Breast, AJCC 8th Edition - Clinical stage from 03/31/2019: Stage IA (cT1b, cN0, cM0, G2, ER+, PR+, HER2-) - Signed by [name], MD on 06/23/2019 - Pathologic stage from 05/21/2019: Stage IB (pT2, pN1a, cM0, G2, ER+, PR+, HER2-) - Signed by [name], MD on 05/21/2019   CHIEF COMPLAINT: Here to discuss management of left breast cancer  Narrative:  The patient returns today for follow-up to discuss radiation treatment options. She was seen in consultation on 03/31/2019.     Since consultation date, she underwent further bilateral breast biopsies on 04/09/2019. The areas in question were seen on breast MRI on 03/27/2019. The right breast areas showed fibrocystic and fibroadenomatoid change and PASH. The left breast lower-outer area confirmed invasive lobular carcinoma, grade 2, ER/PR+, Her2 negative by FISH.  Of note, she had no suspicious nodes on MRI.  She opted to proceed with bilateral nipple sparing mastectomies and left sentinel lymph node biopsy on date of 05/14/2019 with pathology report revealing: multifocal disease, largest tumor size of 2.5 cm; histology of lobular carcinoma; margin status to invasive disease of < 1 mm; nodal status of positive (1/3); Grade 2. She underwent immediate reconstruction at that time.  Receptor status: Testing Performed on Case Number: [id number], Specimen 1       Estrogen Receptor: 90%, positive,  strong       Progesterone Receptor: 90%, positive, strong       HER2: Negative (1+)       Ki-67: 20%    Testing Performed on Case Number: [id number], Specimen 2       Estrogen Receptor: 80%, positive, strong       Progesterone Receptor: 80%, positive, strong       HER2: Negative (0)       Ki-67: 5%     Testing Performed on Case Number: [id number]       Estrogen Receptor: 95%, positive, moderate-weak       Progesterone Receptor: 95%, positive, strong       HER2: By IHC: Equivocal (2+). By FISH: Negative. Ratio of  HER2/CEN17 signals: 1.09. Average HER2 copy number per cell: 1.90.       Ki-67: < 2%   Mammaprint was ordered on the final surgical sample and showed low risk.  She underwent breast implant placement b/l on 07/13/2019.  Symptomatically, the patient reports:  Tender from PT/stretching to left arm  SAFETY ISSUES:  Prior radiation?No  Pacemaker/ICD?No  Possiblecurrent pregnancy?Her husband has had a vasectomy.She last had a negative pregnancy test on 07/13/19. Is the patient on methotrexate?No  She is s/p COVID vaccine.         ALLERGIES:  is allergic to latex and doxycycline.  Meds: Current Outpatient Medications  Medication Sig Dispense Refill  . cetirizine (ZYRTEC) 10 MG tablet Take 10 mg by mouth daily.     Marland Kitchen ELDERBERRY PO Take 15 mLs by mouth daily.    . Multiple Vitamins-Minerals (MULTIVITAMIN  ADULT EXTRA C PO) Take 1 tablet by mouth daily.     Marland Kitchen albuterol (VENTOLIN HFA) 108 (90 Base) MCG/ACT inhaler Inhale 2 puffs into the lungs every 6 (six) hours as needed for wheezing or shortness of breath.     . cyclobenzaprine (FLEXERIL) 5 MG tablet Take 1 tablet (5 mg total) by mouth 2 (two) times daily as needed for up to 20 doses for muscle spasms. (Patient not taking: Reported on 08/04/2019) 20 tablet 0   No current facility-administered medications for this encounter.    Physical  Findings:  height is 5' 7"  (1.702 m) and weight is 143 lb 9.6 oz (65.1 kg). Her temperature is 98.2 F (36.8 C). Her blood pressure is 106/52 (abnormal) and her pulse is 81. Her respiration is 18 and oxygen saturation is 99%. .     General: Alert and oriented, in no acute distress Psychiatric: Judgment and insight are intact. Affect is appropriate. Breast exam reveals bilateral implants; well-healed scars of left side; left nipple noted to be intact  Lab Findings: Lab Results  Component Value Date   WBC 6.5 05/11/2019   HGB 14.1 05/11/2019   HCT 44.1 05/11/2019   MCV 93.8 05/11/2019   PLT 279 05/11/2019    Radiographic Findings: No results found.  Impression/Plan: This is a delightful 49 year old woman with a history of left Breast Cancer, s/p bilateral mastectomies  We discussed adjuvant radiotherapy today.  I recommend 6-1/2 weeks directed at the left chest wall and regional lymph nodes in order to reduce the risk of locoregional recurrence by 2/3.  The risks, benefits and side effects of this treatment were discussed in detail.  She understands that radiotherapy is associated with skin irritation and fatigue in the acute setting. Late effects can include cosmetic changes and rare injury to internal organs.  She is enthusiastic about proceeding with treatment. A consent form has been signed and placed in her chart.  I explained the logistics of breath-hold technique to minimize heart exposure.  I explained that based on her reconstructed breast anatomy some of the radiation may intersect the medial right breast.  I will balance my techniques to provide good coverage of the highest risk tissues while at the same time aiming  to minimize exposure to the normal tissues such as her lungs and heart.  She is also aware that radiation to the reconstructed breast carries potential risks including fibrosis, scar tissue, capsular contracture, nonhealing wounds, possible necrosis of the nipple.  We  discussed the techniques that will be used to reduce these risks.   She is enthusiastic to proceed with treatment.  I look forward to participating in her care.  On date of service, in total, I spent 40 minutes on this encounter. _____________________________________   [name], MD   This document serves as a record of services personally performed by [name], MD. It was created on her behalf by Wilburn Mylar, a trained medical scribe. The creation of this record is based on the scribe's personal observations and the provider's statements to them. This document has been checked and approved by the attending provider.

## 2019-08-04 ENCOUNTER — Ambulatory Visit
Admission: RE | Admit: 2019-08-04 | Discharge: 2019-08-04 | Disposition: A | Discharge status: Home / Self Care | Payer: BC Managed Care – PPO | Source: Ambulatory Visit | Attending: Radiation Oncology | Admitting: Radiation Oncology

## 2019-08-04 ENCOUNTER — Other Ambulatory Visit: Payer: Self-pay

## 2019-08-04 ENCOUNTER — Encounter: Payer: Self-pay | Admitting: Radiation Oncology

## 2019-08-04 VITALS — BP 106/52 | HR 81 | Temp 98.2°F | Resp 18 | Ht 67.0 in | Wt 143.6 lb

## 2019-08-04 DIAGNOSIS — Z170 Estrogen receptor positive status [ER+]: Secondary | ICD-10-CM

## 2019-08-04 DIAGNOSIS — C50312 Malignant neoplasm of lower-inner quadrant of left female breast: Secondary | ICD-10-CM | POA: Insufficient documentation

## 2019-08-04 DIAGNOSIS — Z510 Encounter for antineoplastic radiation therapy: Secondary | ICD-10-CM | POA: Insufficient documentation

## 2019-08-04 LAB — PREGNANCY, URINE: Preg Test, Ur: NEGATIVE

## 2019-08-05 ENCOUNTER — Ambulatory Visit: Payer: BC Managed Care – PPO

## 2019-08-05 ENCOUNTER — Encounter: Payer: Self-pay | Admitting: *Deleted

## 2019-08-05 DIAGNOSIS — Z170 Estrogen receptor positive status [ER+]: Secondary | ICD-10-CM

## 2019-08-05 DIAGNOSIS — M25612 Stiffness of left shoulder, not elsewhere classified: Secondary | ICD-10-CM

## 2019-08-05 DIAGNOSIS — M25611 Stiffness of right shoulder, not elsewhere classified: Secondary | ICD-10-CM

## 2019-08-05 DIAGNOSIS — R293 Abnormal posture: Secondary | ICD-10-CM | POA: Diagnosis not present

## 2019-08-05 DIAGNOSIS — C50312 Malignant neoplasm of lower-inner quadrant of left female breast: Secondary | ICD-10-CM

## 2019-08-05 DIAGNOSIS — Z9013 Acquired absence of bilateral breasts and nipples: Secondary | ICD-10-CM

## 2019-08-05 NOTE — Patient Instructions (Signed)

## 2019-08-05 NOTE — Therapy (Addendum)
[place], [place], [address] Phone: [phone]   Fax:  [phone]  Physical Therapy Treatment  Patient Details  Name: Alice Davis MRN: [id number] Date of Birth: 12/26/1970 Referring Provider (PT): Audelia Hives, DO    Encounter Date: 08/05/2019  PT End of Session - 08/05/19 0906    Visit Number  5    Number of Visits  5    Date for PT Re-Evaluation  08/26/19    PT Start Time  0803    PT Stop Time  0903    PT Time Calculation (min)  60 min    Activity Tolerance  Patient tolerated treatment well    Behavior During Therapy  [facility] for tasks assessed/performed       Past Medical History:  Diagnosis Date  . Asthma   . Cancer [facility])     Past Surgical History:  Procedure Laterality Date  . BREAST RECONSTRUCTION WITH PLACEMENT OF TISSUE EXPANDER AND FLEX HD (ACELLULAR HYDRATED DERMIS) Bilateral 05/14/2019   Procedure: IMMEDIATE BREAST RECONSTRUCTION WITH PLACEMENT OF TISSUE EXPANDER AND FLEX HD (ACELLULAR HYDRATED DERMIS);  Surgeon: Wallace Going, DO;  Location: [place];  Service: Plastics;  Laterality: Bilateral;  . NASAL SINUS SURGERY     20 years ago  . NIPPLE SPARING MASTECTOMY WITH SENTINEL LYMPH NODE BIOPSY Bilateral 05/14/2019   Procedure: BILATERAL NIPPLE SPARING MASTECTOMIES WITH LEFT SENTINEL LYMPH NODE BIOPSY;  Surgeon: [name], MD;  Location: [place];  Service: General;  Laterality: Bilateral;  PEC  . REMOVAL OF BILATERAL TISSUE EXPANDERS WITH PLACEMENT OF BILATERAL BREAST IMPLANTS Bilateral 07/13/2019   Procedure: REMOVAL OF BILATERAL TISSUE EXPANDERS WITH PLACEMENT OF BILATERAL BREAST IMPLANTS;  Surgeon: Wallace Going, DO;  Location: [place];  Service: Plastics;  Laterality: Bilateral;  . RHINOPLASTY     20 years  . WISDOM TOOTH EXTRACTION      There were no vitals filed for this visit.  Subjective Assessment - 08/05/19 0823    Subjective  I had my  radiation simulation and though it was overwhelming it seemed to go well. I was able to get into position but it was a stretch.    Pertinent History  January 7th Bil Masectomy with 3 lymph node removal,Left mastectomy: Multifocal invasive lobular cancer largest focus 2.5 cm, grade 2, margins negative, 1/3 lymph node positive ER 80-95 %, PR 80-95 %, HER-2 negative, Ki-67 2-20%right mastectomy: Benign; then bil implant exchange July 13, 2019    Patient Stated Goals  I want to get rid of my cord and get my arm movement back.    Currently in Pain?  No/denies         [facility] PT Assessment - 08/05/19 0001      Observation/Other Assessments   Observations  L-Dex 0.8      AROM   Right Shoulder Flexion  164 Degrees    Right Shoulder ABduction  183 Degrees    Right Shoulder Internal Rotation  80 Degrees    Right Shoulder External Rotation  90 Degrees    Left Shoulder Flexion  154 Degrees    Left Shoulder ABduction  158 Degrees    Left Shoulder Internal Rotation  64 Degrees    Left Shoulder External Rotation  85 Degrees         The patient was assessed using the L-Dex machine today to produce a lymphedema index baseline score. The patient will be reassessed on a regular basis (typically every  3 months) to obtain new L-Dex scores. If the score is > 6.5 points away from his/her baseline score indicating onset of subclinical lymphedema, it will be recommended to wear a compression garment for 4 weeks, 12 hours per day and then be reassessed. If the score continues to be > 6.5 points from baseline at reassessment, we will initiate lymphedema treatment. Assessing in this manner has a 95% rate of preventing clinically significant lymphedema.           [facility] PT Treatment/Exercise - 08/05/19 0001      Self-Care   Self-Care  Other Self-Care Comments    Other Self-Care Comments   Spent time at beginning of session answering pts questions regarding what to expect during radiation within the  scope of physical therapy about possible skin breakdowns and importance of continued stretching throughout. Pt was able to verbalized good understanding.      Shoulder Exercises: Supine   Horizontal ABduction  Strengthening;Both;10 reps;Theraband    Theraband Level (Shoulder Horizontal ABduction)  Level 1 (Yellow)    External Rotation  Strengthening;Both;10 reps;Theraband    Theraband Level (Shoulder External Rotation)  Level 1 (Yellow)    Flexion  Strengthening;Both;10 reps;Theraband   Narrow and Wide Grip, x10 each   Theraband Level (Shoulder Flexion)  Level 1 (Yellow)    Diagonals  Strengthening;Right;Left;10 reps;Theraband    Theraband Level (Shoulder Diagonals)  Level 1 (Yellow)    Diagonals Limitations  Pt returned therapist demo for all above exercises, tactile cuing for correct UE positioning      Manual Therapy   Myofascial Release  To Lt axilla during P/ROM in varying directions of pull    Passive ROM  In supine to Lt shoulder into flexion, abduction and external rotation w/abduction for correct position needed for radiation.              PT Education - 08/05/19 0840    Education Details  Supine scapular series with yellow theraband    Person(s) Educated  Patient    Methods  Explanation;Demonstration;Handout    Comprehension  Verbalized understanding;Returned demonstration;Tactile cues required;Need further instruction       PT Short Term Goals - 08/05/19 6720      PT SHORT TERM GOAL #1   Title  Pt will be independent with her initial HEP within 1 weeks to demonstrate authonomy of care.    Baseline  pt does not have an HEP she is currently working on since surgery; pt is independent with HEP and husband has been instructed in myofsacial release-08/05/19    Status  Achieved        PT Long Term Goals - 08/05/19 9470      PT LONG TERM GOAL #1   Title  Pt will improve ROM of the L shoulder to 120 degrees of abduction within 2 weeks for improved mobility for  radiation treatment.    Baseline  105 degrees L shoulder abdct; 158 degrees attained today-08/05/19    Status  Achieved            Plan - 08/05/19 9628    Clinical Impression Statement  Final visit today before pt begins radiation next week. Her A/ROM has improved well meeting her goal and being able to attain radiation positioning, though she reports still feeling tight at end range in mold. Encouraged pt to continue stretching and her husband has also been instructed in myofascial release so plan is for them to continue both during radiation. Instructed pt to call  us during or after radiation if tightness persists or if she notices any new swelling. Pt verbalized understanding. She is agreeable to being placed on hold for now. Also got baseline L-Dex score of 0.8 for pt today and she is scheduled to come back in 3 months for reassess. Also progressed HEP to include supine scapular series with yellow theraband.    Personal Factors and Comorbidities  Comorbidity 1    Comorbidities  Bil masectomy, recent reconstructive surgery 07/13/19    Examination-Activity Limitations  Bathing;Reach Overhead;Sleep;Carry;Dressing;Lift;Toileting    Examination-Participation Restrictions  Other    Stability/Clinical Decision Making  Stable/Uncomplicated    Rehab Potential  Good    PT Frequency  2x / week    PT Duration  2 weeks    PT Treatment/Interventions  ADLs/Self Care Home Management;Manual techniques;Patient/family education;Neuromuscular re-education;Therapeutic exercise;Therapeutic activities    PT Next Visit Plan  If pt returns will need a renewal and reassess A/ROM and axillary tightness. How is radiated skin doing?    PT Home Exercise Plan  Access Code: [email or web address]    Consulted and Agree with Plan of Care  Patient       Patient will benefit from skilled therapeutic intervention in order to improve the following deficits and impairments:  Decreased range of motion, Decreased knowledge of precautions,  Postural dysfunction  Visit Diagnosis: Acquired absence of both breasts  Malignant neoplasm of lower-inner quadrant of left breast in female, estrogen receptor positive (HCC)  Stiffness of right shoulder, not elsewhere classified  Stiffness of left shoulder, not elsewhere classified  Abnormal posture     Problem List Patient Active Problem List   Diagnosis Date Noted  . S/P breast reconstruction, bilateral 07/21/2019  . Acquired absence of breast 05/22/2019  . Breast cancer ([place]) 05/14/2019  . Malignant neoplasm of lower-inner quadrant of left breast in female, estrogen receptor positive ([place]) 03/24/2019  . Allergic dermatitis 12/27/2017  . Reactive airway disease without complication 15/09/6977  . Allergic rhinitis 11/28/2016  . Hyperlipidemia 11/28/2016   PHYSICAL THERAPY DISCHARGE SUMMARY  Plan:                                                    Patient goals were not met. Patient is being discharged due to not returning since the last visit.  ?????     Tomma Rakers, PT 11/03/19 5:44 PM  Otelia Limes, PTA 08/05/2019, 9:33 AM  [place], [place], [address] Phone: [phone]   Fax:  [phone]  Name: Alice Davis MRN: [id number] Date of Birth: Feb 21, 1971

## 2019-08-07 DIAGNOSIS — Z170 Estrogen receptor positive status [ER+]: Secondary | ICD-10-CM | POA: Insufficient documentation

## 2019-08-07 DIAGNOSIS — C50312 Malignant neoplasm of lower-inner quadrant of left female breast: Secondary | ICD-10-CM | POA: Diagnosis not present

## 2019-08-07 DIAGNOSIS — Z510 Encounter for antineoplastic radiation therapy: Secondary | ICD-10-CM | POA: Insufficient documentation

## 2019-08-11 ENCOUNTER — Ambulatory Visit
Admission: RE | Admit: 2019-08-11 | Payer: BC Managed Care – PPO | Source: Ambulatory Visit | Admitting: Radiation Oncology

## 2019-08-11 ENCOUNTER — Other Ambulatory Visit: Payer: Self-pay

## 2019-08-11 DIAGNOSIS — C50312 Malignant neoplasm of lower-inner quadrant of left female breast: Secondary | ICD-10-CM | POA: Diagnosis not present

## 2019-08-12 ENCOUNTER — Other Ambulatory Visit: Payer: Self-pay

## 2019-08-12 ENCOUNTER — Ambulatory Visit
Admission: RE | Admit: 2019-08-12 | Discharge: 2019-08-12 | Disposition: A | Discharge status: Home / Self Care | Payer: BC Managed Care – PPO | Source: Ambulatory Visit | Attending: Radiation Oncology | Admitting: Radiation Oncology

## 2019-08-12 DIAGNOSIS — C50312 Malignant neoplasm of lower-inner quadrant of left female breast: Secondary | ICD-10-CM | POA: Diagnosis not present

## 2019-08-13 ENCOUNTER — Ambulatory Visit: Payer: BC Managed Care – PPO

## 2019-08-13 ENCOUNTER — Other Ambulatory Visit: Payer: Self-pay

## 2019-08-13 ENCOUNTER — Ambulatory Visit
Admission: RE | Admit: 2019-08-13 | Discharge: 2019-08-13 | Disposition: A | Discharge status: Home / Self Care | Payer: BC Managed Care – PPO | Source: Ambulatory Visit | Attending: Radiation Oncology | Admitting: Radiation Oncology

## 2019-08-13 DIAGNOSIS — C50312 Malignant neoplasm of lower-inner quadrant of left female breast: Secondary | ICD-10-CM | POA: Diagnosis not present

## 2019-08-14 ENCOUNTER — Other Ambulatory Visit: Payer: Self-pay

## 2019-08-14 ENCOUNTER — Ambulatory Visit
Admission: RE | Admit: 2019-08-14 | Discharge: 2019-08-14 | Disposition: A | Discharge status: Home / Self Care | Payer: BC Managed Care – PPO | Source: Ambulatory Visit | Attending: Radiation Oncology | Admitting: Radiation Oncology

## 2019-08-14 DIAGNOSIS — C50312 Malignant neoplasm of lower-inner quadrant of left female breast: Secondary | ICD-10-CM

## 2019-08-14 DIAGNOSIS — Z170 Estrogen receptor positive status [ER+]: Secondary | ICD-10-CM

## 2019-08-14 MED ORDER — SONAFINE EX EMUL
1.0000 "application " | Freq: Two times a day (BID) | CUTANEOUS | Status: DC
  Administered 2019-08-14: 1 via TOPICAL

## 2019-08-14 MED ORDER — ALRA NON-METALLIC DEODORANT (RAD-ONC)
1.0000 "application " | Freq: Once | TOPICAL | Status: AC
  Administered 2019-08-14: 1 via TOPICAL

## 2019-08-14 NOTE — Progress Notes (Signed)

## 2019-08-17 ENCOUNTER — Ambulatory Visit
Admission: RE | Admit: 2019-08-17 | Discharge: 2019-08-17 | Disposition: A | Discharge status: Home / Self Care | Payer: BC Managed Care – PPO | Source: Ambulatory Visit | Attending: Radiation Oncology | Admitting: Radiation Oncology

## 2019-08-17 ENCOUNTER — Other Ambulatory Visit: Payer: Self-pay

## 2019-08-17 DIAGNOSIS — C50312 Malignant neoplasm of lower-inner quadrant of left female breast: Secondary | ICD-10-CM | POA: Diagnosis not present

## 2019-08-18 ENCOUNTER — Other Ambulatory Visit: Payer: Self-pay

## 2019-08-18 ENCOUNTER — Ambulatory Visit
Admission: RE | Admit: 2019-08-18 | Discharge: 2019-08-18 | Disposition: A | Discharge status: Home / Self Care | Payer: BC Managed Care – PPO | Source: Ambulatory Visit | Attending: Radiation Oncology | Admitting: Radiation Oncology

## 2019-08-18 DIAGNOSIS — C50312 Malignant neoplasm of lower-inner quadrant of left female breast: Secondary | ICD-10-CM | POA: Diagnosis not present

## 2019-08-19 ENCOUNTER — Other Ambulatory Visit: Payer: Self-pay

## 2019-08-19 ENCOUNTER — Ambulatory Visit
Admission: RE | Admit: 2019-08-19 | Discharge: 2019-08-19 | Disposition: A | Discharge status: Home / Self Care | Payer: BC Managed Care – PPO | Source: Ambulatory Visit | Attending: Radiation Oncology | Admitting: Radiation Oncology

## 2019-08-19 DIAGNOSIS — C50312 Malignant neoplasm of lower-inner quadrant of left female breast: Secondary | ICD-10-CM | POA: Diagnosis not present

## 2019-08-20 ENCOUNTER — Other Ambulatory Visit: Payer: Self-pay

## 2019-08-20 ENCOUNTER — Ambulatory Visit
Admission: RE | Admit: 2019-08-20 | Discharge: 2019-08-20 | Disposition: A | Discharge status: Home / Self Care | Payer: BC Managed Care – PPO | Source: Ambulatory Visit | Attending: Radiation Oncology | Admitting: Radiation Oncology

## 2019-08-20 DIAGNOSIS — C50312 Malignant neoplasm of lower-inner quadrant of left female breast: Secondary | ICD-10-CM | POA: Diagnosis not present

## 2019-08-21 ENCOUNTER — Other Ambulatory Visit: Payer: Self-pay

## 2019-08-21 ENCOUNTER — Ambulatory Visit
Admission: RE | Admit: 2019-08-21 | Discharge: 2019-08-21 | Disposition: A | Discharge status: Home / Self Care | Payer: BC Managed Care – PPO | Source: Ambulatory Visit | Attending: Radiation Oncology | Admitting: Radiation Oncology

## 2019-08-21 DIAGNOSIS — C50312 Malignant neoplasm of lower-inner quadrant of left female breast: Secondary | ICD-10-CM | POA: Diagnosis not present

## 2019-08-24 ENCOUNTER — Other Ambulatory Visit: Payer: Self-pay

## 2019-08-24 ENCOUNTER — Ambulatory Visit
Admission: RE | Admit: 2019-08-24 | Discharge: 2019-08-24 | Disposition: A | Discharge status: Home / Self Care | Payer: BC Managed Care – PPO | Source: Ambulatory Visit | Attending: Radiation Oncology | Admitting: Radiation Oncology

## 2019-08-24 DIAGNOSIS — C50312 Malignant neoplasm of lower-inner quadrant of left female breast: Secondary | ICD-10-CM | POA: Diagnosis not present

## 2019-08-25 ENCOUNTER — Other Ambulatory Visit: Payer: Self-pay

## 2019-08-25 ENCOUNTER — Ambulatory Visit
Admission: RE | Admit: 2019-08-25 | Discharge: 2019-08-25 | Disposition: A | Discharge status: Home / Self Care | Payer: BC Managed Care – PPO | Source: Ambulatory Visit | Attending: Radiation Oncology | Admitting: Radiation Oncology

## 2019-08-25 DIAGNOSIS — C50312 Malignant neoplasm of lower-inner quadrant of left female breast: Secondary | ICD-10-CM | POA: Diagnosis not present

## 2019-08-26 ENCOUNTER — Other Ambulatory Visit: Payer: Self-pay

## 2019-08-26 ENCOUNTER — Ambulatory Visit
Admission: RE | Admit: 2019-08-26 | Discharge: 2019-08-26 | Disposition: A | Discharge status: Home / Self Care | Payer: BC Managed Care – PPO | Source: Ambulatory Visit | Attending: Radiation Oncology | Admitting: Radiation Oncology

## 2019-08-26 DIAGNOSIS — C50312 Malignant neoplasm of lower-inner quadrant of left female breast: Secondary | ICD-10-CM | POA: Diagnosis not present

## 2019-08-27 ENCOUNTER — Other Ambulatory Visit: Payer: Self-pay

## 2019-08-27 ENCOUNTER — Ambulatory Visit
Admission: RE | Admit: 2019-08-27 | Discharge: 2019-08-27 | Disposition: A | Discharge status: Home / Self Care | Payer: BC Managed Care – PPO | Source: Ambulatory Visit | Attending: Radiation Oncology | Admitting: Radiation Oncology

## 2019-08-27 DIAGNOSIS — C50312 Malignant neoplasm of lower-inner quadrant of left female breast: Secondary | ICD-10-CM | POA: Diagnosis not present

## 2019-08-28 ENCOUNTER — Ambulatory Visit
Admission: RE | Admit: 2019-08-28 | Discharge: 2019-08-28 | Disposition: A | Discharge status: Home / Self Care | Payer: BC Managed Care – PPO | Source: Ambulatory Visit | Attending: Radiation Oncology | Admitting: Radiation Oncology

## 2019-08-28 ENCOUNTER — Other Ambulatory Visit: Payer: Self-pay

## 2019-08-28 DIAGNOSIS — C50312 Malignant neoplasm of lower-inner quadrant of left female breast: Secondary | ICD-10-CM | POA: Diagnosis not present

## 2019-08-31 ENCOUNTER — Ambulatory Visit
Admission: RE | Admit: 2019-08-31 | Discharge: 2019-08-31 | Disposition: A | Discharge status: Home / Self Care | Payer: BC Managed Care – PPO | Source: Ambulatory Visit | Attending: Radiation Oncology | Admitting: Radiation Oncology

## 2019-08-31 ENCOUNTER — Other Ambulatory Visit: Payer: Self-pay

## 2019-08-31 DIAGNOSIS — C50312 Malignant neoplasm of lower-inner quadrant of left female breast: Secondary | ICD-10-CM | POA: Diagnosis not present

## 2019-09-01 ENCOUNTER — Other Ambulatory Visit: Payer: Self-pay

## 2019-09-01 ENCOUNTER — Ambulatory Visit
Admission: RE | Admit: 2019-09-01 | Discharge: 2019-09-01 | Disposition: A | Discharge status: Home / Self Care | Payer: BC Managed Care – PPO | Source: Ambulatory Visit | Attending: Radiation Oncology | Admitting: Radiation Oncology

## 2019-09-01 DIAGNOSIS — C50312 Malignant neoplasm of lower-inner quadrant of left female breast: Secondary | ICD-10-CM | POA: Diagnosis not present

## 2019-09-02 ENCOUNTER — Other Ambulatory Visit: Payer: Self-pay

## 2019-09-02 ENCOUNTER — Ambulatory Visit
Admission: RE | Admit: 2019-09-02 | Discharge: 2019-09-02 | Disposition: A | Discharge status: Home / Self Care | Payer: BC Managed Care – PPO | Source: Ambulatory Visit | Attending: Radiation Oncology | Admitting: Radiation Oncology

## 2019-09-02 DIAGNOSIS — C50312 Malignant neoplasm of lower-inner quadrant of left female breast: Secondary | ICD-10-CM | POA: Diagnosis not present

## 2019-09-03 ENCOUNTER — Ambulatory Visit
Admission: RE | Admit: 2019-09-03 | Discharge: 2019-09-03 | Disposition: A | Discharge status: Home / Self Care | Payer: BC Managed Care – PPO | Source: Ambulatory Visit | Attending: Radiation Oncology | Admitting: Radiation Oncology

## 2019-09-03 ENCOUNTER — Other Ambulatory Visit: Payer: Self-pay

## 2019-09-03 DIAGNOSIS — C50312 Malignant neoplasm of lower-inner quadrant of left female breast: Secondary | ICD-10-CM | POA: Diagnosis not present

## 2019-09-04 ENCOUNTER — Other Ambulatory Visit: Payer: Self-pay

## 2019-09-04 ENCOUNTER — Ambulatory Visit
Admission: RE | Admit: 2019-09-04 | Discharge: 2019-09-04 | Disposition: A | Discharge status: Home / Self Care | Payer: BC Managed Care – PPO | Source: Ambulatory Visit | Attending: Radiation Oncology | Admitting: Radiation Oncology

## 2019-09-04 DIAGNOSIS — C50312 Malignant neoplasm of lower-inner quadrant of left female breast: Secondary | ICD-10-CM | POA: Diagnosis not present

## 2019-09-07 ENCOUNTER — Ambulatory Visit
Admission: RE | Admit: 2019-09-07 | Discharge: 2019-09-07 | Disposition: A | Discharge status: Home / Self Care | Payer: BC Managed Care – PPO | Source: Ambulatory Visit | Attending: Radiation Oncology | Admitting: Radiation Oncology

## 2019-09-07 ENCOUNTER — Other Ambulatory Visit: Payer: Self-pay

## 2019-09-07 DIAGNOSIS — C50312 Malignant neoplasm of lower-inner quadrant of left female breast: Secondary | ICD-10-CM | POA: Diagnosis present

## 2019-09-07 DIAGNOSIS — Z170 Estrogen receptor positive status [ER+]: Secondary | ICD-10-CM | POA: Diagnosis present

## 2019-09-07 DIAGNOSIS — Z510 Encounter for antineoplastic radiation therapy: Secondary | ICD-10-CM | POA: Diagnosis not present

## 2019-09-08 ENCOUNTER — Other Ambulatory Visit: Payer: Self-pay

## 2019-09-08 ENCOUNTER — Ambulatory Visit
Admission: RE | Admit: 2019-09-08 | Discharge: 2019-09-08 | Disposition: A | Discharge status: Home / Self Care | Payer: BC Managed Care – PPO | Source: Ambulatory Visit | Attending: Radiation Oncology | Admitting: Radiation Oncology

## 2019-09-08 DIAGNOSIS — C50312 Malignant neoplasm of lower-inner quadrant of left female breast: Secondary | ICD-10-CM | POA: Diagnosis not present

## 2019-09-09 ENCOUNTER — Other Ambulatory Visit: Payer: Self-pay

## 2019-09-09 ENCOUNTER — Ambulatory Visit
Admission: RE | Admit: 2019-09-09 | Discharge: 2019-09-09 | Disposition: A | Discharge status: Home / Self Care | Payer: BC Managed Care – PPO | Source: Ambulatory Visit | Attending: Radiation Oncology | Admitting: Radiation Oncology

## 2019-09-09 DIAGNOSIS — C50312 Malignant neoplasm of lower-inner quadrant of left female breast: Secondary | ICD-10-CM | POA: Diagnosis not present

## 2019-09-10 ENCOUNTER — Other Ambulatory Visit: Payer: Self-pay

## 2019-09-10 ENCOUNTER — Ambulatory Visit
Admission: RE | Admit: 2019-09-10 | Discharge: 2019-09-10 | Disposition: A | Discharge status: Home / Self Care | Payer: BC Managed Care – PPO | Source: Ambulatory Visit | Attending: Radiation Oncology | Admitting: Radiation Oncology

## 2019-09-10 ENCOUNTER — Telehealth: Payer: Self-pay | Admitting: Hematology and Oncology

## 2019-09-10 DIAGNOSIS — C50312 Malignant neoplasm of lower-inner quadrant of left female breast: Secondary | ICD-10-CM | POA: Diagnosis not present

## 2019-09-10 NOTE — Telephone Encounter (Signed)
Pt aware of appt on 5/21. Per 5/6 sch msg.

## 2019-09-11 ENCOUNTER — Other Ambulatory Visit: Payer: Self-pay

## 2019-09-11 ENCOUNTER — Ambulatory Visit
Admission: RE | Admit: 2019-09-11 | Discharge: 2019-09-11 | Disposition: A | Discharge status: Home / Self Care | Payer: BC Managed Care – PPO | Source: Ambulatory Visit | Attending: Radiation Oncology | Admitting: Radiation Oncology

## 2019-09-11 DIAGNOSIS — Z170 Estrogen receptor positive status [ER+]: Secondary | ICD-10-CM

## 2019-09-11 DIAGNOSIS — C50312 Malignant neoplasm of lower-inner quadrant of left female breast: Secondary | ICD-10-CM

## 2019-09-11 MED ORDER — SONAFINE EX EMUL
1.0000 "application " | Freq: Two times a day (BID) | CUTANEOUS | Status: DC
  Administered 2019-09-11: 1 via TOPICAL

## 2019-09-14 ENCOUNTER — Ambulatory Visit: Payer: BC Managed Care – PPO | Admitting: Radiation Oncology

## 2019-09-14 ENCOUNTER — Other Ambulatory Visit: Payer: Self-pay

## 2019-09-14 ENCOUNTER — Ambulatory Visit
Admission: RE | Admit: 2019-09-14 | Discharge: 2019-09-14 | Disposition: A | Discharge status: Home / Self Care | Payer: BC Managed Care – PPO | Source: Ambulatory Visit | Attending: Radiation Oncology | Admitting: Radiation Oncology

## 2019-09-14 DIAGNOSIS — C50312 Malignant neoplasm of lower-inner quadrant of left female breast: Secondary | ICD-10-CM | POA: Diagnosis not present

## 2019-09-15 ENCOUNTER — Ambulatory Visit
Admission: RE | Admit: 2019-09-15 | Discharge: 2019-09-15 | Disposition: A | Discharge status: Home / Self Care | Payer: BC Managed Care – PPO | Source: Ambulatory Visit | Attending: Radiation Oncology | Admitting: Radiation Oncology

## 2019-09-15 ENCOUNTER — Other Ambulatory Visit: Payer: Self-pay

## 2019-09-15 DIAGNOSIS — C50312 Malignant neoplasm of lower-inner quadrant of left female breast: Secondary | ICD-10-CM | POA: Diagnosis not present

## 2019-09-16 ENCOUNTER — Other Ambulatory Visit: Payer: Self-pay

## 2019-09-16 ENCOUNTER — Ambulatory Visit
Admission: RE | Admit: 2019-09-16 | Discharge: 2019-09-16 | Disposition: A | Discharge status: Home / Self Care | Payer: BC Managed Care – PPO | Source: Ambulatory Visit | Attending: Radiation Oncology | Admitting: Radiation Oncology

## 2019-09-16 DIAGNOSIS — C50312 Malignant neoplasm of lower-inner quadrant of left female breast: Secondary | ICD-10-CM | POA: Diagnosis not present

## 2019-09-17 ENCOUNTER — Other Ambulatory Visit: Payer: Self-pay

## 2019-09-17 ENCOUNTER — Ambulatory Visit
Admission: RE | Admit: 2019-09-17 | Discharge: 2019-09-17 | Disposition: A | Discharge status: Home / Self Care | Payer: BC Managed Care – PPO | Source: Ambulatory Visit | Attending: Radiation Oncology | Admitting: Radiation Oncology

## 2019-09-17 DIAGNOSIS — C50312 Malignant neoplasm of lower-inner quadrant of left female breast: Secondary | ICD-10-CM | POA: Diagnosis not present

## 2019-09-18 ENCOUNTER — Other Ambulatory Visit: Payer: Self-pay

## 2019-09-18 ENCOUNTER — Ambulatory Visit
Admission: RE | Admit: 2019-09-18 | Discharge: 2019-09-18 | Disposition: A | Discharge status: Home / Self Care | Payer: BC Managed Care – PPO | Source: Ambulatory Visit | Attending: Radiation Oncology | Admitting: Radiation Oncology

## 2019-09-18 DIAGNOSIS — C50312 Malignant neoplasm of lower-inner quadrant of left female breast: Secondary | ICD-10-CM | POA: Diagnosis not present

## 2019-09-21 ENCOUNTER — Ambulatory Visit
Admission: RE | Admit: 2019-09-21 | Discharge: 2019-09-21 | Disposition: A | Discharge status: Home / Self Care | Payer: BC Managed Care – PPO | Source: Ambulatory Visit | Attending: Radiation Oncology | Admitting: Radiation Oncology

## 2019-09-21 DIAGNOSIS — C50312 Malignant neoplasm of lower-inner quadrant of left female breast: Secondary | ICD-10-CM | POA: Diagnosis not present

## 2019-09-21 DIAGNOSIS — Z170 Estrogen receptor positive status [ER+]: Secondary | ICD-10-CM

## 2019-09-21 MED ORDER — SONAFINE EX EMUL
1.0000 "application " | Freq: Two times a day (BID) | CUTANEOUS | Status: DC
  Administered 2019-09-21: 1 via TOPICAL

## 2019-09-22 ENCOUNTER — Other Ambulatory Visit: Payer: Self-pay

## 2019-09-22 ENCOUNTER — Ambulatory Visit
Admission: RE | Admit: 2019-09-22 | Discharge: 2019-09-22 | Disposition: A | Discharge status: Home / Self Care | Payer: BC Managed Care – PPO | Source: Ambulatory Visit | Attending: Radiation Oncology | Admitting: Radiation Oncology

## 2019-09-22 DIAGNOSIS — C50312 Malignant neoplasm of lower-inner quadrant of left female breast: Secondary | ICD-10-CM | POA: Diagnosis not present

## 2019-09-23 ENCOUNTER — Ambulatory Visit
Admission: RE | Admit: 2019-09-23 | Discharge: 2019-09-23 | Disposition: A | Discharge status: Home / Self Care | Payer: BC Managed Care – PPO | Source: Ambulatory Visit | Attending: Radiation Oncology | Admitting: Radiation Oncology

## 2019-09-23 ENCOUNTER — Other Ambulatory Visit: Payer: Self-pay

## 2019-09-23 DIAGNOSIS — C50312 Malignant neoplasm of lower-inner quadrant of left female breast: Secondary | ICD-10-CM | POA: Diagnosis not present

## 2019-09-24 ENCOUNTER — Encounter: Payer: Self-pay | Admitting: *Deleted

## 2019-09-24 ENCOUNTER — Ambulatory Visit
Admission: RE | Admit: 2019-09-24 | Discharge: 2019-09-24 | Disposition: A | Discharge status: Home / Self Care | Payer: BC Managed Care – PPO | Source: Ambulatory Visit | Attending: Radiation Oncology | Admitting: Radiation Oncology

## 2019-09-24 ENCOUNTER — Other Ambulatory Visit: Payer: Self-pay

## 2019-09-24 DIAGNOSIS — C50312 Malignant neoplasm of lower-inner quadrant of left female breast: Secondary | ICD-10-CM | POA: Diagnosis not present

## 2019-09-24 NOTE — Progress Notes (Signed)
Patient Care Team: [name], MD as PCP - General (Family Medicine) Mauro Kaufmann, RN as Oncology Nurse Navigator Rockwell Germany, RN as Oncology Nurse Navigator  DIAGNOSIS:    ICD-10-CM   1. Malignant neoplasm of lower-inner quadrant of left breast in female, estrogen receptor positive ([place])  C50.312    Z17.0     SUMMARY OF ONCOLOGIC HISTORY: Oncology History  Malignant neoplasm of lower-inner quadrant of left breast in female, estrogen receptor positive ([place])  03/13/2019 Initial Diagnosis   Screening detected left breast abnormalities, 0.6 cm at 8:30 position and 0.9 cm at 8 o'clock position with 1 abnormal left axillary lymph node.  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%, PR 90%, HER-2 -1+, Ki-67 20%; biopsy 8:30 position: ER 80%, PR 80%, Ki-67 5%, HER-2 negative; lymph node biopsy negative   03/31/2019 Cancer Staging   Staging form: Breast, AJCC 8th Edition - Clinical stage from 03/31/2019: Stage IA (cT1b, cN0, cM0, G2, ER+, PR+, HER2-) - Signed by [name], MD on 06/23/2019   05/14/2019 Surgery   Bilateral mastectomies: Left mastectomy: Multifocal invasive lobular cancer largest focus 2.5 cm, grade 2, margins negative, 1/3 lymph node positive ER 80-95 %, PR 80-95 %, HER-2 negative, Ki-67 2-20% right mastectomy: Benign   05/21/2019 Cancer Staging   Staging form: Breast, AJCC 8th Edition - Pathologic stage from 05/21/2019: Stage IB (pT2, pN1a, cM0, G2, ER+, PR+, HER2-) - Signed by [name], MD on 05/21/2019   06/01/2019 Oncotype testing   Mammaprint: low risk   08/12/2019 - 09/25/2019 Radiation Therapy   Adjuvant radiation     CHIEF COMPLIANT: Follow-up to discuss antiestrogen therapy   INTERVAL HISTORY: Alice Davis is a 49 y.o. with above-mentioned history of left breast cancer who underwent a left mastectomy with reconstruction and is currently on radiation treatment. She presents to the clinic today to discuss  antiestrogen therapy.  She is having radiation dermatitis but she is able to manage it.  She is accompanied by her husband today.  She has multiple questions about how her breast cancer was missed in January 2020.  Unfortunately I was not able to pull up the images from before.  ALLERGIES:  is allergic to latex and doxycycline.  MEDICATIONS:  Current Outpatient Medications  Medication Sig Dispense Refill  . albuterol (VENTOLIN HFA) 108 (90 Base) MCG/ACT inhaler Inhale 2 puffs into the lungs every 6 (six) hours as needed for wheezing or shortness of breath.     . cetirizine (ZYRTEC) 10 MG tablet Take 10 mg by mouth daily.     Marland Kitchen ELDERBERRY PO Take 15 mLs by mouth daily.    . Multiple Vitamins-Minerals (MULTIVITAMIN ADULT EXTRA C PO) Take 1 tablet by mouth daily.     . tamoxifen (NOLVADEX) 20 MG tablet Take 1 tablet (20 mg total) by mouth daily. 90 tablet 3   No current facility-administered medications for this visit.    PHYSICAL EXAMINATION: ECOG PERFORMANCE STATUS: 1 - Symptomatic but completely ambulatory  Vitals:   09/25/19 0827  BP: 107/65  Pulse: 97  Resp: 20  Temp: 99.1 F (37.3 C)  SpO2: 100%   Filed Weights   09/25/19 0827  Weight: 139 lb 11.2 oz (63.4 kg)    LABORATORY DATA:  I have reviewed the data as listed No flowsheet data found.  Lab Results  Component Value Date   WBC 6.5 05/11/2019   HGB 14.1 05/11/2019   HCT 44.1 05/11/2019  MCV 93.8 05/11/2019   PLT 279 05/11/2019    ASSESSMENT & PLAN:  Malignant neoplasm of lower-inner quadrant of left breast in female, estrogen receptor positive ([place]) 03/13/2019:Screening detected left breast abnormalities, 0.6 cm at 8:30 position and 0.9 cm at 8 o'clock position with 1 abnormal left axillary lymph node. 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 These include but not limited to insomnia, hot flashes, mood changes, vaginal dryness, and weight gain. Although rare, serious side effects including endometrial cancer, risk of blood clots were also discussed. We strongly believe that the benefits far outweigh the risks. Patient understands these risks and consented to starting treatment. Planned treatment duration is 10 years.  Patient is willing to participate in antiestrogen therapy adherence and compliance study. We also counseled her about upbeat clinical trial. Return to clinic in 3 months for survivorship care plan visit    No orders of the defined types were placed in this encounter.  The patient has a good understanding of the overall plan. she agrees with it. she will call with any problems that may develop before the next visit here.  Total time spent: 30 mins including face to face time and time spent for planning, charting and coordination of care  [name], MD 09/25/2019  I, Cloyde Reams Dorshimer, am acting as scribe for Dr. [name].  I have reviewed the above documentation for accuracy and completeness, and I agree with the above.

## 2019-09-25 ENCOUNTER — Other Ambulatory Visit: Payer: Self-pay

## 2019-09-25 ENCOUNTER — Encounter: Payer: Self-pay | Admitting: Radiation Oncology

## 2019-09-25 ENCOUNTER — Ambulatory Visit
Admission: RE | Admit: 2019-09-25 | Discharge: 2019-09-25 | Disposition: A | Discharge status: Home / Self Care | Payer: BC Managed Care – PPO | Source: Ambulatory Visit | Attending: Radiation Oncology | Admitting: Radiation Oncology

## 2019-09-25 ENCOUNTER — Other Ambulatory Visit (HOSPITAL_COMMUNITY): Payer: Self-pay | Admitting: Hematology and Oncology

## 2019-09-25 ENCOUNTER — Inpatient Hospital Stay: Payer: BC Managed Care – PPO | Attending: Hematology and Oncology | Admitting: Hematology and Oncology

## 2019-09-25 DIAGNOSIS — Z170 Estrogen receptor positive status [ER+]: Secondary | ICD-10-CM

## 2019-09-25 DIAGNOSIS — Z923 Personal history of irradiation: Secondary | ICD-10-CM | POA: Insufficient documentation

## 2019-09-25 DIAGNOSIS — Z9013 Acquired absence of bilateral breasts and nipples: Secondary | ICD-10-CM | POA: Insufficient documentation

## 2019-09-25 DIAGNOSIS — C50312 Malignant neoplasm of lower-inner quadrant of left female breast: Secondary | ICD-10-CM | POA: Insufficient documentation

## 2019-09-25 DIAGNOSIS — Z006 Encounter for examination for normal comparison and control in clinical research program: Secondary | ICD-10-CM

## 2019-09-25 MED ORDER — TAMOXIFEN CITRATE 20 MG PO TABS: 20.0000 mg | ORAL_TABLET | Freq: Every day | ORAL | 3 refills | Status: DC

## 2019-09-25 NOTE — Assessment & Plan Note (Signed)
03/13/2019:Screening detected left breast abnormalities, 0.6 cm at 8:30 position and 0.9 cm at 8 o'clock position with 1 abnormal left axillary lymph node. 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 ER 90%, PR 90%, HER-2 -1+, Ki-67 20%; biopsy 8:30 position: ER 80%, PR 80%, Ki-67 5%, HER-2 negative; lymph node biopsy negative T1 BN 0 stage Ia clinical stage  Bilateral mastectomies: Left mastectomy: Multifocal invasive lobular cancer largest focus 2.5 cm, grade 2, margins negative, 1/3 lymph node positive ER 80-95 %, PR 80-95 %, HER-2 negative, Ki-67 2-20% right mastectomy: Benign T2 N1 a stage Ib  MammaPrint: Low risk Adjuvant radiation: 08/12/2019-09/25/2019  Treatment plan:Adjuvant antiestrogen therapy: We discussed tamoxifen for 10 years versus tamoxifen for 3 years followed by aromatase inhibitor for 5 years if she is menopausal  Tamoxifen counseling:We discussed the risks and benefits of tamoxifen. These include but not limited to insomnia, hot flashes, mood changes, vaginal dryness, and weight gain. Although rare, serious side effects including endometrial cancer, risk of blood clots were also discussed. We strongly believe that the benefits far outweigh the risks. Patient understands these risks and consented to starting treatment. Planned treatment duration is 10 years.  Patient is willing to participate in antiestrogen therapy adherence and compliance study. Return to clinic in 3 months for survivorship care plan visit

## 2019-09-25 NOTE — Research (Signed)
UPBEAT [id number] - UNDERSTANDING and PREDICTING BREAST CANCER EVENTS AFTER TREATMENT  09/25/2019 0925am  REFERRAL: Consulted received on behalf of clinical research nurse Doristine Johns for UPBEAT study. Met with Lennie and her husband Vicente Males for approximately 15 minutes in exam room 28 to review the study. The purpose for the study, risks and benefits, participation requirements, costs and compensation for participating and optional study specimens were briefly reviewed and an opportunity to ask questions was provided: all questions were answered. Breia denies symptomatic claustrophobia and feels she can tolerate the cardiac MRI requirement for UPBEAT. Patient was advised participation is completely voluntary and that she may withdraw at any time: patient verbalizes understanding. Two copies of the UPBEAT study consent along with respective HIPPA forms were provided for further review, as well as my direct contact information and Nikki's information. Akyla is scheduled to begin Tamoxifen on 10/06/19 but is currently not working so she is available next week for baseline activities; I advised her that I will inquire to see if a cardiac MRI appointment is available for next week and she states that would be fine. The current plan is for Sherese and Vicente Males to take the study information home for further discussion and have Nikki call on Monday as a follow-up. Patient and her spouse were thanked for their time and consideration of participation. Will verify study eligibility, contact the MRI department for availability and have Nikki follow-up on Monday.   0950am MRI: Spoke with Marjory Lies in MRI, states their is an appointment available on Tuesday, 09/29/19, at 11am for a cardiac MRI. Appointment was scheduled to hold the spot for [facility]. Upon completion of follow-up call Monday, should the patient choose to voluntarily participate in the study, the MRI can be completed on Tuesday or changed to Wednesday per patient  preference. Alternatively, should she chose not to participate, Lexine Baton can cancel the appointment. I will follow-up with Nikki on Monday to ensure she is aware of the current plan for [facility].  1028AM ELIGIBILITY: Dhruvi's medical record was reviewed and she is eligible for participation in the UPBEAT study. Will need an updated ECOG score for participation.  Dionne Bucy. Sharlett Iles, BSN, RN, CIC 09/25/2019 10:29 AM

## 2019-09-28 ENCOUNTER — Telehealth: Payer: Self-pay | Admitting: *Deleted

## 2019-09-28 DIAGNOSIS — Z006 Encounter for examination for normal comparison and control in clinical research program: Secondary | ICD-10-CM

## 2019-09-28 NOTE — Telephone Encounter (Signed)
WF [id number] Upbeat Study;  Called patient to follow up on this study.  Introduced myself and asked patient if she had any questions. Patient asked about the timeline of study assessments. I answered patients questions about timing of the study assessments during the first 2 years and the yearly phone calls afterwards for 9 more years. Patient had a question about whether her insurance would cover any injury she might sustain as a result of the study so she did call them and confirmed they will not deny coverage for any injury that happens while participating in the study. Patient stated she is interested in participating.  We discussed the baseline activities that would need to be done prior to patient starting on Tamoxifen.  Patient is scheduled for Cardiac MRI tomorrow at 11 am and she agreed to come in at 8:30 am to consent and have other study assessments completed prior to the MRI.  Thanked patient for her time and willingness to enroll on this study. Asked patient to call back if any questions before tomorrow. She verbalized understanding.  Foye Spurling, BSN, RN Clinical Research Nurse 09/28/2019 12:00 PM

## 2019-09-29 ENCOUNTER — Inpatient Hospital Stay: Payer: BC Managed Care – PPO | Admitting: *Deleted

## 2019-09-29 ENCOUNTER — Inpatient Hospital Stay: Payer: BC Managed Care – PPO

## 2019-09-29 ENCOUNTER — Other Ambulatory Visit: Payer: Self-pay

## 2019-09-29 ENCOUNTER — Ambulatory Visit (HOSPITAL_COMMUNITY)
Admission: RE | Admit: 2019-09-29 | Discharge: 2019-09-29 | Disposition: A | Discharge status: Home / Self Care | Payer: Self-pay | Source: Ambulatory Visit | Attending: Hematology and Oncology | Admitting: Hematology and Oncology

## 2019-09-29 ENCOUNTER — Encounter: Payer: Self-pay | Admitting: *Deleted

## 2019-09-29 VITALS — BP 100/66 | HR 78 | Ht 67.5 in | Wt 139.8 lb

## 2019-09-29 DIAGNOSIS — Z006 Encounter for examination for normal comparison and control in clinical research program: Secondary | ICD-10-CM

## 2019-09-29 DIAGNOSIS — C50312 Malignant neoplasm of lower-inner quadrant of left female breast: Secondary | ICD-10-CM | POA: Diagnosis not present

## 2019-09-29 LAB — RESEARCH LABS

## 2019-09-29 NOTE — Research (Signed)
DCP-001 Use of a Clinical Trial Screening Tool to Address Cancer Health Disparities in the [place] Program: Patient enrolled on the [place] study today.  Introduced the DCP-001study to patient and offered her the opportunity to participate. Informed patient that participation is voluntary and involves a one time consent and collection of demographic variables, with the majority of data collected from her medical record. Noted that no patient identifiers are being reported to the study. Patient stated she was interested.  The consent and authorization forms were reviewed with the patient in their entirety. Explained the potential benefits and risks of participation in this study. All of her questions were answered and she agreed to participate. The consent form for PVD 10/28/18, [place] Active 05/12/19 and authorization form dated 06/28/14 were signed and dated by the patient. Copies of the documents were given to the patient for her records.  Patient was then interviewed by this research nurse and answered the study questions without difficulty.  Thanked patient for her time and participation on this study.  Patient meets eligibility and will be enrolled in the DCP-001 study.  Worksheet and original consent given to Remer Macho, research specialist to enroll patient on study. Foye Spurling, BSN, RN Clinical Research Nurse 09/29/2019

## 2019-09-29 NOTE — Research (Signed)
[id number] UPBEAT STUDY Consent and Baseline Assessments:  Patient in clinic by herself this morning to consent for and complete the baseline activities for the Upbeat study.  Consent: Met with patient in private exam room. Patient reports she read the consent form at home and wants to enroll on this study. Spent 30 minutes reviewing consent form for protocol version date 06/10/2019, [place] active date 07/16/2019 along with hippa form dated 09/21/15 in their entirety with patient. Discussion included voluntary nature of this study and possible risks and benefits of participation along with all the required study activities and time points. Patient denied any questions and she signed and dated both forms where indicated. Patient also agreed to the optional use of her blood for future research and to be contacted for future research. Patient did not want to receive study questionnaires by email. Copies of the consent and hippa forms given to patient for her records.  Patient meets all eligibility requirements to be enrolled in the Upbeat study and Dr. Lindi Adie agrees patient is eligible.            Patient denied any contraindications to having MRI completed and does not have any non-compatible MRI implanted metal or devices. She states she can hold her breath for at least 10 seconds. Patient denies claustrophobia. She states she can walk at least 2 blocks without chest pain, dyspnea, shortness of breath or fainting. She is also able to walk on a treadmill without difficulty. Patient denies any history listed in the exclusion criteria of protocol, including pregnancy.  Additional eligibility review completed by Hendricks Limes, research RN who agrees patient meets all eligibility criteria for enrollment.  Registered in [place]; Patient ID [id number].  PROs; Given to patient to complete in clinic. Research nurse collected and checked for completion and accuracy at the end of the visit.  ELFYB01 Questionnaire;  Patient answered questions in clinic.  Concomitant Meds; Current medication list reviewed with patient and medication list updated.  Patient states she will start taking Tamoxifen on 10/06/19 as directed by Dr. Lindi Adie.  Lab; Research specimens collected per protocol including optional specimen for future DNA testing.  Patient confirmed she had been fasting for at least 3 hours prior to blood collection.  VS; Collected per protocol. See VS flowsheet. Waist Measurement; 30 inches. Physical Functions Testing; Completed without difficulty.  Neurocognitive Testing; Administered by Farris Has, research assistant.  Cardiac MRI; Completed without difficulty.  Gift Card; [phone] [employer] gift card given to patient for completing baseline activities on this study.  Plan; Research blood to be collected in 30 days from when she starts Tamoxifen. Target date 11/05/19 (+/- 7 days).  Patient has appt to see Dr. Isidore Moos on 10/30/19 and this date falls within the window for this blood collection.  Patient agreed to have lab appt before seeing Dr. Isidore Moos to collect research blood.  Informed patient she does not need to fast for this lab appt.. Informed that 3 months visit target date is 01/04/20 (+/- 30 days).  Patient is scheduled to see Wilber Bihari, NP on 12/30/19.  We can complete the 3 months visit on that date if patient is able. Patient states she will let research nurse know what her work schedule is to see if that would work.  My business card with phone number and email given to patient to call for any questions. Thanked patient for her participation in this study.  Foye Spurling, BSN, RN Clinical Research Nurse 09/29/2019  Thanked patient for her willingness to participate on this study.

## 2019-09-30 ENCOUNTER — Telehealth: Payer: Self-pay | Admitting: *Deleted

## 2019-09-30 NOTE — Telephone Encounter (Signed)
Scheduled per 5/25 sch message. Research Phonacelle stated pt is aware of appts

## 2019-10-01 ENCOUNTER — Telehealth: Payer: Self-pay | Admitting: Adult Health

## 2019-10-01 ENCOUNTER — Encounter (INDEPENDENT_AMBULATORY_CARE_PROVIDER_SITE_OTHER): Payer: Self-pay

## 2019-10-01 NOTE — Telephone Encounter (Signed)
Called and talked to patient about AET monitoring study.  She is going to sign up for my chart and then get my chart app on her tablet or smart phone.  She will call me tomorrow if needed.    Wilber Bihari, NP

## 2019-10-01 NOTE — Telephone Encounter (Signed)
Called and [facility] for patient to call me back so we can review AET monitoring study.  Wilber Bihari, NP

## 2019-10-08 ENCOUNTER — Encounter (INDEPENDENT_AMBULATORY_CARE_PROVIDER_SITE_OTHER): Payer: Self-pay

## 2019-10-09 ENCOUNTER — Encounter: Payer: Self-pay | Admitting: Plastic Surgery

## 2019-10-09 ENCOUNTER — Other Ambulatory Visit: Payer: Self-pay

## 2019-10-09 ENCOUNTER — Ambulatory Visit (INDEPENDENT_AMBULATORY_CARE_PROVIDER_SITE_OTHER): Payer: BC Managed Care – PPO | Admitting: Plastic Surgery

## 2019-10-09 ENCOUNTER — Telehealth: Payer: Self-pay

## 2019-10-09 VITALS — BP 101/69 | HR 90 | Temp 97.8°F | Ht 68.0 in | Wt 140.0 lb

## 2019-10-09 DIAGNOSIS — Z9889 Other specified postprocedural states: Secondary | ICD-10-CM

## 2019-10-09 NOTE — Progress Notes (Signed)
The patient is a 49 year old female here for follow-up on her bilateral breast reconstruction.  She finished her left-sided breast radiation about 2 weeks ago.  Overall she is doing extremely well.  The incisions are closed and seem to be healing nicely.  She has a little implant palpation on the right lateral breast.  This does feel like a combination of the implant and the capsule.  It does not appear to be anything concerning.  We will certainly evaluate this again at her next visit.  She is very pleased with her results.  I would like to see her back in about 6 months.  If she has any questions or concerns definitely give me a call.

## 2019-10-09 NOTE — Telephone Encounter (Signed)
Per Wilber Bihari, NP, spoke with pt regarding when  She started AET. Pt states she started 10/06/19 and she has not missed any doses.

## 2019-10-09 NOTE — Telephone Encounter (Signed)
-----   Message from Gardenia Phlegm, NP sent at 10/09/2019  7:50 AM EDT ----- Hi!  Can you please call her and see when she started AET.  She only took 3 doses this week

## 2019-10-15 ENCOUNTER — Encounter (INDEPENDENT_AMBULATORY_CARE_PROVIDER_SITE_OTHER): Payer: Self-pay

## 2019-10-22 ENCOUNTER — Encounter (INDEPENDENT_AMBULATORY_CARE_PROVIDER_SITE_OTHER): Payer: Self-pay

## 2019-10-27 ENCOUNTER — Other Ambulatory Visit (HOSPITAL_COMMUNITY): Payer: Self-pay | Admitting: Hematology and Oncology

## 2019-10-27 ENCOUNTER — Ambulatory Visit: Payer: Self-pay | Admitting: Radiation Oncology

## 2019-10-27 ENCOUNTER — Telehealth: Payer: Self-pay | Admitting: *Deleted

## 2019-10-27 DIAGNOSIS — Z006 Encounter for examination for normal comparison and control in clinical research program: Secondary | ICD-10-CM

## 2019-10-27 NOTE — Telephone Encounter (Signed)
Upbeat; Called patient to confirm her lab appt this Friday at 9:30 am for research labs. She does not need to fast for this appointment. Patient verbalized understanding.  Patient asked if her appointment with Wilber Bihari on 12/30/19 can be changed from 10:30 am to 4 pm due to her work schedule.  Informed patient I will request the change.  Discussed 3 months visit activities and time frame for completing per study protocol. Patient agreed to research appointment, including Cardiac MRI on Thursday Aug. 5th starting at 2:30 pm. Cardiac MRI scheduled for 3 pm with patient to arrive at 2:30 pm and research appointment to follow.  Thanked patient so much for her time and asked her to call or email if she needs to change any of the appointment dates or times. Patient verbalized understanding.  Foye Spurling, BSN, RN Clinical Research Nurse 10/27/2019 1:28 PM

## 2019-10-28 NOTE — Progress Notes (Signed)
Alice Davis returns for her 1 month follow-up after completing radiation to left breast on 09/25/2019  Patient reports skin is still peeling, but otherwise intact. It is still a markedly different color from residing skin. She states left breast is still more swollen than right, and she is going to PT next week to have measurements taken. She reports tightness and decrease in range of motion to left shoulder, and is interested in continued PT to help regain mobility. She has started her Tamoxifen prescription and reports increased fatigue and nausea, but otherwise no issues. She did report some lower throat/chest tightness when she goes to lay down for bed. She states she has to cough a few times and readjust to get comfortable in bed, and states that the issue resolves by morning  Vitals:   10/30/19 0953  BP: (!) 110/56  Pulse: 88  Resp: 18  Temp: 97.9 F (36.6 C)  SpO2: 100%   Wt Readings from Last 3 Encounters:  10/30/19 136 lb 12.8 oz (62.1 kg)  10/09/19 140 lb (63.5 kg)  09/29/19 139 lb 12.8 oz (63.4 kg)

## 2019-10-29 ENCOUNTER — Encounter (INDEPENDENT_AMBULATORY_CARE_PROVIDER_SITE_OTHER): Payer: Self-pay

## 2019-10-30 ENCOUNTER — Telehealth: Payer: Self-pay | Admitting: Hematology and Oncology

## 2019-10-30 ENCOUNTER — Other Ambulatory Visit: Payer: Self-pay

## 2019-10-30 ENCOUNTER — Other Ambulatory Visit: Payer: Self-pay | Admitting: *Deleted

## 2019-10-30 ENCOUNTER — Ambulatory Visit
Admission: RE | Admit: 2019-10-30 | Discharge: 2019-10-30 | Disposition: A | Discharge status: Home / Self Care | Payer: BC Managed Care – PPO | Source: Ambulatory Visit | Attending: Radiation Oncology | Admitting: Radiation Oncology

## 2019-10-30 ENCOUNTER — Encounter: Payer: Self-pay | Admitting: *Deleted

## 2019-10-30 ENCOUNTER — Inpatient Hospital Stay: Payer: BC Managed Care – PPO | Attending: Hematology and Oncology

## 2019-10-30 VITALS — BP 110/56 | HR 88 | Temp 97.9°F | Resp 18 | Ht 67.5 in | Wt 136.8 lb

## 2019-10-30 DIAGNOSIS — Z79810 Long term (current) use of selective estrogen receptor modulators (SERMs): Secondary | ICD-10-CM | POA: Insufficient documentation

## 2019-10-30 DIAGNOSIS — Z923 Personal history of irradiation: Secondary | ICD-10-CM | POA: Diagnosis not present

## 2019-10-30 DIAGNOSIS — Z006 Encounter for examination for normal comparison and control in clinical research program: Secondary | ICD-10-CM

## 2019-10-30 DIAGNOSIS — L819 Disorder of pigmentation, unspecified: Secondary | ICD-10-CM | POA: Insufficient documentation

## 2019-10-30 DIAGNOSIS — C50312 Malignant neoplasm of lower-inner quadrant of left female breast: Secondary | ICD-10-CM | POA: Diagnosis present

## 2019-10-30 DIAGNOSIS — Z170 Estrogen receptor positive status [ER+]: Secondary | ICD-10-CM

## 2019-10-30 LAB — RESEARCH LABS

## 2019-10-30 NOTE — Telephone Encounter (Signed)
Scheduled per 6/25 sch message.  Phonacelle stated pt is aware of appt.

## 2019-10-30 NOTE — Progress Notes (Signed)
1 Month Upbeat Assessment; Labs;  Patient had blood samples collected for the 1 month time point on the Upbeat Study.  CV Events;  Patient has not had any hospitalizations or cardiovascular events since enrolling on this study.  Plan;  Patient is scheduled for 3 months assessment on 12/10/19 which includes Cardiac MRI.  Informed patient of need for fasting research labs on this appointment and she agreed to have labs after the Cardiac MRI. Patient states she can fast after lunchtime since the lab will not be until after 3:30 pm.  Reminded patient to arrive for MRI at 2:30 pm that day.  Research nurse will call her again before that appt to remind her to fast.  Encouraged patient to call research nurse if any questions before that appointment.  Patient verbalized understanding.  Thanked patient for coming in today to give blood samples for this study.  Foye Spurling, BSN, RN Clinical Research Nurse 10/30/2019 10:57 AM

## 2019-11-02 ENCOUNTER — Ambulatory Visit: Payer: Self-pay

## 2019-11-02 ENCOUNTER — Encounter: Payer: Self-pay | Admitting: Radiation Oncology

## 2019-11-02 NOTE — Progress Notes (Signed)
  Radiation Oncology         [id number]) [phone] ________________________________  Name: Alice Davis MRN: [id number]  Date: 10/30/2019  DOB: 10/13/1970  Follow-Up Visit Note  Outpatient  CC: [name], MD  [name], MD  Diagnosis and Prior Radiotherapy:    ICD-10-CM   1. Malignant neoplasm of lower-inner quadrant of left breast in female, estrogen receptor positive ([place])  C50.312    Z17.0     CHIEF COMPLAINT: Here for follow-up and surveillance of breast cancer  Narrative:  The patient returns today for routine follow-up.  Her skin has healed very nicely.  She still feels some swelling and notes some mild hyperpigmentation.  She feels some muscular tightness in her chest when she lies down at bedtime.  She feels a little bit of tightness in her lower throat with this positioning as well.                              ALLERGIES:  is allergic to latex and doxycycline.  Meds: Current Outpatient Medications  Medication Sig Dispense Refill  . cetirizine (ZYRTEC) 10 MG tablet Take 10 mg by mouth daily.     Marland Kitchen ELDERBERRY PO Take 15 mLs by mouth daily.    . Multiple Vitamins-Minerals (MULTIVITAMIN ADULT EXTRA C PO) Take 1 tablet by mouth daily.     . tamoxifen (NOLVADEX) 20 MG tablet Take 1 tablet (20 mg total) by mouth daily. 90 tablet 3   No current facility-administered medications for this encounter.    Physical Findings: The patient is in no acute distress. Patient is alert and oriented.  height is 5' 7.5" (1.715 m) and weight is 136 lb 12.8 oz (62.1 kg). Her temperature is 97.9 F (36.6 C). Her blood pressure is 110/56 (abnormal) and her pulse is 88. Her respiration is 18 and oxygen saturation is 100%. .    Satisfactory skin healing in radiotherapy fields.  Mild hyperpigmentation remains.  Skin is intact.  She has incomplete range of motion in her left shoulder.   Lab Findings: Lab Results  Component Value Date   WBC 6.5 05/11/2019   HGB 14.1 05/11/2019   HCT 44.1  05/11/2019   MCV 93.8 05/11/2019   PLT 279 05/11/2019    Radiographic Findings: No results found.  Impression/Plan: Healing well from radiotherapy  Continue skin care with topical Vitamin E Oil and / or lotion for at least 2 more months for further healing.  She still has some decreased range of motion in her left shoulder.  I have referred her back to physical therapy for this.  The muscular tightness and tightness in her throat when she lies down at night may be related to prior treatment, mostly surgery I would suspect.  She will let her physicians know if this is getting worse.  It seems relatively mild.  Does not seem cardiac in nature.  She has scheduled followup with medical oncology.  She has seen the plastic surgery recently.  I will see her back on an as-needed basis. I have encouraged her to call if she has any issues or concerns in the future. I wished her the very best.    On date of service, in total, I spent 20 minutes on this encounter. Patient was seen in person.  _____________________________________   [name], MD

## 2019-11-03 ENCOUNTER — Other Ambulatory Visit: Payer: Self-pay

## 2019-11-03 ENCOUNTER — Encounter: Payer: Self-pay | Admitting: Physical Therapy

## 2019-11-03 ENCOUNTER — Ambulatory Visit: Payer: BC Managed Care – PPO | Attending: Radiation Oncology | Admitting: Physical Therapy

## 2019-11-03 ENCOUNTER — Telehealth: Payer: Self-pay | Admitting: *Deleted

## 2019-11-03 DIAGNOSIS — L599 Disorder of the skin and subcutaneous tissue related to radiation, unspecified: Secondary | ICD-10-CM | POA: Diagnosis present

## 2019-11-03 DIAGNOSIS — R293 Abnormal posture: Secondary | ICD-10-CM | POA: Diagnosis present

## 2019-11-03 DIAGNOSIS — M25612 Stiffness of left shoulder, not elsewhere classified: Secondary | ICD-10-CM

## 2019-11-03 NOTE — Therapy (Addendum)
[place], [place], [address] Phone: [phone]   Fax:  [phone]  Physical Therapy Evaluation  Patient Details  Name: Alice Davis MRN: [id number] Date of Birth: 11-Apr-1971 Referring Provider (PT): Reita May Date: 11/03/2019   PT End of Session - 11/03/19 1345    Visit Number 1    Number of Visits 9    Date for PT Re-Evaluation 12/01/19    PT Start Time 1301    PT Stop Time 1345    PT Time Calculation (min) 44 min    Activity Tolerance Patient tolerated treatment well    Behavior During Therapy [facility] for tasks assessed/performed           Past Medical History:  Diagnosis Date  . Asthma   . Cancer [facility])     Past Surgical History:  Procedure Laterality Date  . BREAST RECONSTRUCTION WITH PLACEMENT OF TISSUE EXPANDER AND FLEX HD (ACELLULAR HYDRATED DERMIS) Bilateral 05/14/2019   Procedure: IMMEDIATE BREAST RECONSTRUCTION WITH PLACEMENT OF TISSUE EXPANDER AND FLEX HD (ACELLULAR HYDRATED DERMIS);  Surgeon: Wallace Going, DO;  Location: [place];  Service: Plastics;  Laterality: Bilateral;  . NASAL SINUS SURGERY     20 years ago  . NIPPLE SPARING MASTECTOMY WITH SENTINEL LYMPH NODE BIOPSY Bilateral 05/14/2019   Procedure: BILATERAL NIPPLE SPARING MASTECTOMIES WITH LEFT SENTINEL LYMPH NODE BIOPSY;  Surgeon: [name], MD;  Location: [place];  Service: General;  Laterality: Bilateral;  PEC  . REMOVAL OF BILATERAL TISSUE EXPANDERS WITH PLACEMENT OF BILATERAL BREAST IMPLANTS Bilateral 07/13/2019   Procedure: REMOVAL OF BILATERAL TISSUE EXPANDERS WITH PLACEMENT OF BILATERAL BREAST IMPLANTS;  Surgeon: Wallace Going, DO;  Location: [place];  Service: Plastics;  Laterality: Bilateral;  . RHINOPLASTY     20 years  . WISDOM TOOTH EXTRACTION      There were no vitals filed for this visit.    Subjective Assessment - 11/03/19 1306    Subjective I had cording down my  left arm which is why I came last time. I am still not able to raise my arm up and it feels very tight.    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 No/denies    Pain Score 0-No pain              [facility] PT Assessment - 11/03/19 0001      Assessment   Medical Diagnosis left breast cancer    Referring Provider (PT) Isidore Moos    Onset Date/Surgical Date 05/14/19    Hand Dominance Right    Prior Therapy pt received PT at this clinic in 07/2019 for ROM for radiation and cording      Precautions   Precautions Other (comment)    Precaution Comments at risk for lymphedema      Restrictions   Weight Bearing Restrictions No      Balance Screen   Has the patient fallen in the past 6 months No    Has the patient had a decrease in activity level because of a fear of falling?  No  Is the patient reluctant to leave their home because of a fear of falling?  No      Home Environment   Living Environment Private residence    Living Arrangements Spouse/significant other;Children    Available Help at Discharge Family    Type of [place]      Prior Function   Level of [place] Part time employment   will go fulltime 11/16/19   Vocation Requirements office work    Leisure tries to walk a couple miles a day, stretching with band      Cognition   Overall Cognitive Status Within Functional Limits for tasks assessed      AROM   Right Shoulder Flexion 173 Degrees    Right Shoulder ABduction 180 Degrees    Right Shoulder Internal Rotation 80 Degrees    Right  Shoulder External Rotation 90 Degrees    Left Shoulder Flexion 152 Degrees    Left Shoulder ABduction 147 Degrees    Left Shoulder Internal Rotation 50 Degrees    Left Shoulder External Rotation 67 Degrees             LYMPHEDEMA/ONCOLOGY QUESTIONNAIRE - 11/03/19 0001      Type   Cancer Type        Surgeries   Mastectomy Date 05/14/19    Sentinel Lymph Node Biopsy Date 05/14/19    Other Surgery Date 07/13/19   reconstruction   Number Lymph Nodes Removed 3      Treatment   Active Chemotherapy Treatment No    Past Chemotherapy Treatment No    Active Radiation Treatment No    Past Radiation Treatment Yes    Current Hormone Treatment Yes    Drug Name Tamoxifen    Past Hormone Therapy No      What other symptoms do you have   Are you Having Heaviness or Tightness Yes    Are you having Pain No    Are you having pitting edema No    Is it Hard or Difficult finding clothes that fit No    Do you have infections No    Is there Decreased scar mobility Yes      Lymphedema Assessments   Lymphedema Assessments Upper extremities      Right Upper Extremity Lymphedema   15 cm Proximal to Olecranon Process 25 cm    10 cm Proximal to Olecranon Process --    Olecranon Process 21.9 cm    15 cm Proximal to Ulnar Styloid Process 21.4 cm    10 cm Proximal to Ulnar Styloid Process --    Just Proximal to Ulnar Styloid Process 14.6 cm    Across Hand at [employer] 18 cm    At [place] of 2nd Digit 5.5 cm      Left Upper Extremity Lymphedema   15 cm Proximal to Olecranon Process 25.3 cm    10 cm Proximal to Olecranon Process --    Olecranon Process 22.9 cm    15 cm Proximal to Ulnar Styloid Process 20.8 cm    10 cm Proximal to Ulnar Styloid Process --    Just Proximal to Ulnar Styloid Process 14.2 cm    Across Hand at [employer] 16.8 cm    At [place] of 2nd Digit 5.3 cm           L-DEX FLOWSHEETS - 11/03/19 1400      L-DEX LYMPHEDEMA SCREENING   Measurement Type Unilateral  L-DEX MEASUREMENT EXTREMITY Upper Extremity    POSITION  Standing    DOMINANT SIDE Right    At Risk Side Left    BASELINE SCORE (UNILATERAL) 0.8    L-DEX SCORE (UNILATERAL) 1.2    VALUE CHANGE (UNILAT) 0.4                  Objective measurements completed on examination: See above findings.       [facility] Adult PT Treatment/Exercise - 11/03/19 0001      Shoulder Exercises: Supine   Flexion AAROM;Both;10 reps   using dowel with 10 sec holds, pt returned therapist demo   ABduction AAROM;Left;10 reps   with dowel, with 10 sec holds- pt returned therapist demo     Manual Therapy   Soft tissue mobilization briefly to left lateral trunk    Myofascial Release briefly to L axilla at end range L shoulder ROM                    PT Short Term Goals - 08/05/19 0924      PT SHORT TERM GOAL #1   Title Pt will be independent with her initial HEP within 1 weeks to demonstrate authonomy of care.    Baseline pt does not have an HEP she is currently working on since surgery; pt is independent with HEP and husband has been instructed in myofsacial release-08/05/19    Status Achieved             PT Long Term Goals - 11/03/19 1350      PT LONG TERM GOAL #1   Title Pt will demonstrate 160 degrees of left shoulder flexion to allow pt to reach overhead    Baseline 152    Time 4    Period Weeks    Status New    Target Date 12/01/19      PT LONG TERM GOAL #2   Title Pt will demonstratse 160 degrees of left shoulder abduction to allow pt to reach out to the sides    Baseline 147    Time 4    Period Weeks    Status New    Target Date 12/01/19      PT LONG TERM GOAL #3   Title Pt will report a 75% improvement in tightness and discomfort in left lateral trunk with end range shoulder ROM for improved comfort.    Time 4    Period Weeks    Status New    Target Date 12/01/19      PT LONG TERM GOAL #4   Title Pt will be independent in a home exercise program for continued  strengthening and stretching    Time 4    Period Weeks    Status New    Target Date 12/01/19                  Plan - 11/03/19 1346    Clinical Impression Statement Pt presents to PT today with decreased left shoulder ROM and tightness in L axilla. She underwent bilateral mastectomy on 1-7/21 and reconstruction on 07/22/19. She has completed radiation. She reports tightness in left axilla with L shoulder end range of motion. Remeasured L dex score today which was in normal limits. Pt would benefit from skilled PT services to increase L shoulder ROM and decrease L axillary tightness.    Examination-Activity Limitations Reach Overhead    Stability/Clinical Decision Making Stable/Uncomplicated    Rehab Potential Good  PT Frequency 2x / week    PT Duration 4 weeks    PT Treatment/Interventions ADLs/Self Care Home Management;Manual techniques;Patient/family education;Therapeutic exercise;Passive range of motion;Manual lymph drainage;Vasopneumatic Device;Scar mobilization    PT Next Visit Plan MFR and STM to L lateral/posterior trunk in area of tightness, ROM exercises L shoulder    PT Home Exercise Plan supine dowel exercises    Consulted and Agree with Plan of Care Patient           Patient will benefit from skilled therapeutic intervention in order to improve the following deficits and impairments:  Decreased range of motion, Postural dysfunction, Decreased scar mobility, Impaired UE functional use, Increased fascial restricitons, Decreased strength  Visit Diagnosis: Stiffness of left shoulder, not elsewhere classified - Plan: PT plan of care cert/re-cert  Disorder of the skin and subcutaneous tissue related to radiation, unspecified - Plan: PT plan of care cert/re-cert  Abnormal posture - Plan: PT plan of care cert/re-cert     Problem List Patient Active Problem List   Diagnosis Date Noted  . S/P breast reconstruction, bilateral 07/21/2019  . Acquired absence of breast  05/22/2019  . Breast cancer ([place]) 05/14/2019  . Malignant neoplasm of lower-inner quadrant of left breast in female, estrogen receptor positive ([place]) 03/24/2019  . Allergic dermatitis 12/27/2017  . Reactive airway disease without complication 46/96/2952  . Allergic rhinitis 11/28/2016  . Hyperlipidemia 11/28/2016    [name] [facility] 11/03/2019, 2:45 PM  [place], [place], [address] Phone: [phone]   Fax:  [phone]  Name: Alice Davis MRN: [id number] Date of Birth: 1971-04-17  Manus Gunning, PT 11/03/19 2:45 PM

## 2019-11-03 NOTE — Telephone Encounter (Signed)
Alice Davis and I were able to discuss over the phone.  At this point, she believes she has access to what she is needing, and would like confirmation when the charge is corrected to be billed to the trial.  I apologized for the error and will notify the patient once I have confirmation the cardiac MRI charge is billed to the trial.  Doreatha Martin, RN, BSN, [facility] 11/03/2019 4:17 PM

## 2019-11-03 NOTE — Telephone Encounter (Addendum)
Upbeat Study; Patient left VM stating she is trying to get her medical records at [facility] released  but Medical Records will not release her records because patient's account says she has a unpaid bill for the Cardiac MRI patient had as part of the Upbeat Study. Patient says she has talked to several people and is "very frustrated" because it was her understanding the study would pay for this MRI and she would not billed for it.  Patient asked if research nurse can help resolve this problem so she can get her medical records and not be billed for the study MRI.   Spoke with Visual merchandiser, Doreatha Martin, for assistance to resolve this issue for patient. Erline Levine will reach out to billing and request the charges sent to the study and not to patient.  Foye Spurling, BSN, RN Clinical Research Nurse 11/03/2019 1:29 PM

## 2019-11-03 NOTE — Telephone Encounter (Signed)
Called patient and apologized for the billing error. Confirmed that the Cardiac MRI completed on 09/29/2019 should be bill to the study and not to patient and not to patient's insurance.  Informed patient the clinical research manager is working to resolve this error. Patient voiced frustration and states she needs resolution today so she can submit records to her insurance company before a deadline.  Clinical Geophysicist/field seismologist, Doreatha Martin, will call patient today.  Foye Spurling, BSN, RN Clinical Research Nurse 11/03/2019 2:39 PM

## 2019-11-03 NOTE — Patient Instructions (Signed)
Shoulder: Flexion (Supine)    With hands shoulder width apart, slowly lower dowel to floor behind head. Do not let elbows bend. Keep back flat. Hold 10-30____ seconds. Repeat _10___ times. Do _1-2___ sessions per day. CAUTION: Stretch slowly and gently.  Copyright  VHI. All rights reserved.  Shoulder: Abduction (Supine)    With left arm flat on floor, hold dowel in palm. Slowly move arm up to side of head by pushing with opposite arm. Do not let elbow bend. Hold _10-30___ seconds. Repeat _10___ times. Do __2__ sessions per day. CAUTION: Stretch slowly and gently.  Copyright  VHI. All rights reserved.

## 2019-11-03 NOTE — Telephone Encounter (Signed)
11/03/19 5:08 PM I spoke with Alice Davis over the phone and confirmed with her the cardiac MRI charge has been corrected, and is now marked for the trial to pay.  Doreatha Martin, RN, BSN, [facility] 11/03/2019 5:09 PM

## 2019-11-05 ENCOUNTER — Other Ambulatory Visit: Payer: Self-pay

## 2019-11-05 ENCOUNTER — Encounter (INDEPENDENT_AMBULATORY_CARE_PROVIDER_SITE_OTHER): Payer: Self-pay

## 2019-11-05 ENCOUNTER — Encounter: Payer: Self-pay | Admitting: Physical Therapy

## 2019-11-05 ENCOUNTER — Ambulatory Visit: Payer: BC Managed Care – PPO | Attending: Radiation Oncology | Admitting: Physical Therapy

## 2019-11-05 DIAGNOSIS — C50312 Malignant neoplasm of lower-inner quadrant of left female breast: Secondary | ICD-10-CM | POA: Diagnosis present

## 2019-11-05 DIAGNOSIS — L599 Disorder of the skin and subcutaneous tissue related to radiation, unspecified: Secondary | ICD-10-CM | POA: Insufficient documentation

## 2019-11-05 DIAGNOSIS — Z9013 Acquired absence of bilateral breasts and nipples: Secondary | ICD-10-CM | POA: Insufficient documentation

## 2019-11-05 DIAGNOSIS — M25611 Stiffness of right shoulder, not elsewhere classified: Secondary | ICD-10-CM | POA: Insufficient documentation

## 2019-11-05 DIAGNOSIS — M25612 Stiffness of left shoulder, not elsewhere classified: Secondary | ICD-10-CM | POA: Diagnosis not present

## 2019-11-05 DIAGNOSIS — R293 Abnormal posture: Secondary | ICD-10-CM | POA: Diagnosis present

## 2019-11-05 DIAGNOSIS — Z170 Estrogen receptor positive status [ER+]: Secondary | ICD-10-CM | POA: Insufficient documentation

## 2019-11-05 NOTE — Therapy (Deleted)
[place], [place], [address] Phone: [phone]   Fax:  [phone]  Physical Therapy Treatment  Patient Details  Name: Alice Davis MRN: [id number] Date of Birth: 04-28-1971 Referring Provider (PT): Reita May Date: 11/05/2019   PT End of Session - 11/05/19 1749    Visit Number 2    Number of Visits 9    Date for PT Re-Evaluation 12/01/19    PT Start Time 1404    PT Stop Time 1456    PT Time Calculation (min) 52 min    Activity Tolerance Patient tolerated treatment well    Behavior During Therapy [facility] for tasks assessed/performed           Past Medical History:  Diagnosis Date  . Asthma   . Cancer [facility])     Past Surgical History:  Procedure Laterality Date  . BREAST RECONSTRUCTION WITH PLACEMENT OF TISSUE EXPANDER AND FLEX HD (ACELLULAR HYDRATED DERMIS) Bilateral 05/14/2019   Procedure: IMMEDIATE BREAST RECONSTRUCTION WITH PLACEMENT OF TISSUE EXPANDER AND FLEX HD (ACELLULAR HYDRATED DERMIS);  Surgeon: Wallace Going, DO;  Location: [place];  Service: Plastics;  Laterality: Bilateral;  . NASAL SINUS SURGERY     20 years ago  . NIPPLE SPARING MASTECTOMY WITH SENTINEL LYMPH NODE BIOPSY Bilateral 05/14/2019   Procedure: BILATERAL NIPPLE SPARING MASTECTOMIES WITH LEFT SENTINEL LYMPH NODE BIOPSY;  Surgeon: [name], MD;  Location: [place];  Service: General;  Laterality: Bilateral;  PEC  . REMOVAL OF BILATERAL TISSUE EXPANDERS WITH PLACEMENT OF BILATERAL BREAST IMPLANTS Bilateral 07/13/2019   Procedure: REMOVAL OF BILATERAL TISSUE EXPANDERS WITH PLACEMENT OF BILATERAL BREAST IMPLANTS;  Surgeon: Wallace Going, DO;  Location: [place];  Service: Plastics;  Laterality: Bilateral;  . RHINOPLASTY     20 years  . WISDOM TOOTH EXTRACTION      There were no vitals filed for this visit.   Subjective Assessment - 11/05/19 1406    Subjective I am still tight. Nothing  new.    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 No/denies    Pain Score 0-No pain                             [facility] Adult PT Treatment/Exercise - 11/05/19 0001      Manual Therapy   Manual Therapy Soft tissue mobilization;Passive ROM    Soft tissue mobilization to left lateral trunk along serratus and latissimus in area of tightness and tenderness and to L axilla    Passive ROM with prolonged holds in direction of flexion and abduction                       PT Long Term Goals - 11/03/19 1350      PT LONG TERM GOAL #1   Title Pt will demonstrate 160 degrees of left shoulder flexion to allow pt to reach overhead    Baseline 152    Time 4    Period Weeks    Status New  Target Date 12/01/19      PT LONG TERM GOAL #2   Title Pt will demonstratse 160 degrees of left shoulder abduction to allow pt to reach out to the sides    Baseline 147    Time 4    Period Weeks    Status New    Target Date 12/01/19      PT LONG TERM GOAL #3   Title Pt will report a 75% improvement in tightness and discomfort in left lateral trunk with end range shoulder ROM for improved comfort.    Time 4    Period Weeks    Status New    Target Date 12/01/19      PT LONG TERM GOAL #4   Title Pt will be independent in a home exercise program for continued strengthening and stretching    Time 4    Period Weeks    Status New    Target Date 12/01/19                 Plan - 11/05/19 1459    Clinical Impression Statement Instructed pt in AAROM exercises today to  improve L shoulder ROM and decrease tightness. Began STM to left lateral trunk in area of serratus and latissimus where tightness and tenderness was palpated. Also focused on tightness in left axilla. Next session will instruct pt in supine scapular strengthening exercises.    PT Frequency 2x / week    PT Duration 4 weeks    PT Treatment/Interventions ADLs/Self Care Home Management;Manual techniques;Patient/family education;Therapeutic exercise;Passive range of motion;Manual lymph drainage;Vasopneumatic Device;Scar mobilization    PT Next Visit Plan instruct in supine scapular strengthening exercises, MFR and STM to L lateral/posterior trunk in area of tightness, ROM exercises L shoulder    PT Home Exercise Plan supine dowel exercises    Consulted and Agree with Plan of Care Patient           Patient will benefit from skilled therapeutic intervention in order to improve the following deficits and impairments:  Decreased range of motion, Postural dysfunction, Decreased scar mobility, Impaired UE functional use, Increased fascial restricitons, Decreased strength  Visit Diagnosis: Stiffness of left shoulder, not elsewhere classified  Disorder of the skin and subcutaneous tissue related to radiation, unspecified  Abnormal posture     Problem List Patient Active Problem List   Diagnosis Date Noted  . S/P breast reconstruction, bilateral 07/21/2019  . Acquired absence of breast 05/22/2019  . Breast cancer ([place]) 05/14/2019  . Malignant neoplasm of lower-inner quadrant of left breast in female, estrogen receptor positive ([place]) 03/24/2019  . Allergic dermatitis 12/27/2017  . Reactive airway disease without complication 16/38/4536  . Allergic rhinitis 11/28/2016  . Hyperlipidemia 11/28/2016    [name] [facility] 11/05/2019, 3:02 PM  [place], [place], [address] Phone: [phone]   Fax:   [phone]  Name: Mattie Nordell MRN: [id number] Date of Birth: 01/23/1971

## 2019-11-05 NOTE — Therapy (Signed)
[place], [place], [address] Phone: [phone]   Fax:  [phone]  Physical Therapy Treatment  Patient Details  Name: Alice Davis MRN: [id number] Date of Birth: 05-Jun-1970 Referring Provider (PT): Reita May Date: 11/05/2019   PT End of Session - 11/05/19 2620    Visit Number 2    Number of Visits 9    Date for PT Re-Evaluation 12/01/19    PT Start Time 1404    PT Stop Time 1456    PT Time Calculation (min) 52 min    Activity Tolerance Patient tolerated treatment well    Behavior During Therapy [facility] for tasks assessed/performed           Past Medical History:  Diagnosis Date  . Asthma   . Cancer [facility])     Past Surgical History:  Procedure Laterality Date  . BREAST RECONSTRUCTION WITH PLACEMENT OF TISSUE EXPANDER AND FLEX HD (ACELLULAR HYDRATED DERMIS) Bilateral 05/14/2019   Procedure: IMMEDIATE BREAST RECONSTRUCTION WITH PLACEMENT OF TISSUE EXPANDER AND FLEX HD (ACELLULAR HYDRATED DERMIS);  Surgeon: Wallace Going, DO;  Location: [place];  Service: Plastics;  Laterality: Bilateral;  . NASAL SINUS SURGERY     20 years ago  . NIPPLE SPARING MASTECTOMY WITH SENTINEL LYMPH NODE BIOPSY Bilateral 05/14/2019   Procedure: BILATERAL NIPPLE SPARING MASTECTOMIES WITH LEFT SENTINEL LYMPH NODE BIOPSY;  Surgeon: [name], MD;  Location: [place];  Service: General;  Laterality: Bilateral;  PEC  . REMOVAL OF BILATERAL TISSUE EXPANDERS WITH PLACEMENT OF BILATERAL BREAST IMPLANTS Bilateral 07/13/2019   Procedure: REMOVAL OF BILATERAL TISSUE EXPANDERS WITH PLACEMENT OF BILATERAL BREAST IMPLANTS;  Surgeon: Wallace Going, DO;  Location: [place];  Service: Plastics;  Laterality: Bilateral;  . RHINOPLASTY     20 years  . WISDOM TOOTH EXTRACTION      There were no vitals filed for this visit.   Subjective Assessment - 11/05/19 1406    Subjective I am still tight. Nothing  new.    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 No/denies    Pain Score 0-No pain                             [facility] Adult PT Treatment/Exercise - 11/05/19 0001      Shoulder Exercises: Pulleys   Flexion 2 minutes   with v/c for stretch at end range   ABduction 2 minutes   with verbal cues for stretch at end range     Shoulder Exercises: Therapy Ball   Flexion 10 reps;Both   with stretch at end range   ABduction Left;10 reps   with stretch at end range     Manual Therapy   Manual Therapy Soft tissue mobilization;Passive ROM    Soft tissue mobilization to left lateral trunk along serratus and latissimus in area of tightness and tenderness and to L axilla    Passive ROM with prolonged holds in direction of flexion and abduction  PT Long Term Goals - 11/03/19 1350      PT LONG TERM GOAL #1   Title Pt will demonstrate 160 degrees of left shoulder flexion to allow pt to reach overhead    Baseline 152    Time 4    Period Weeks    Status New    Target Date 12/01/19      PT LONG TERM GOAL #2   Title Pt will demonstratse 160 degrees of left shoulder abduction to allow pt to reach out to the sides    Baseline 147    Time 4    Period Weeks    Status New    Target Date 12/01/19      PT LONG TERM GOAL #3   Title Pt will report a 75% improvement in tightness and discomfort in left lateral trunk with end range shoulder ROM for improved comfort.    Time 4    Period Weeks    Status New    Target Date 12/01/19      PT LONG TERM  GOAL #4   Title Pt will be independent in a home exercise program for continued strengthening and stretching    Time 4    Period Weeks    Status New    Target Date 12/01/19                 Plan - 11/05/19 1459    Clinical Impression Statement Instructed pt in AAROM exercises today to improve L shoulder ROM and decrease tightness. Began STM to left lateral trunk in area of serratus and latissimus where tightness and tenderness was palpated. Also focused on tightness in left axilla. Next session will instruct pt in supine scapular strengthening exercises.    PT Frequency 2x / week    PT Duration 4 weeks    PT Treatment/Interventions ADLs/Self Care Home Management;Manual techniques;Patient/family education;Therapeutic exercise;Passive range of motion;Manual lymph drainage;Vasopneumatic Device;Scar mobilization    PT Next Visit Plan instruct in supine scapular strengthening exercises, MFR and STM to L lateral/posterior trunk in area of tightness, ROM exercises L shoulder    PT Home Exercise Plan supine dowel exercises    Consulted and Agree with Plan of Care Patient           Patient will benefit from skilled therapeutic intervention in order to improve the following deficits and impairments:  Decreased range of motion, Postural dysfunction, Decreased scar mobility, Impaired UE functional use, Increased fascial restricitons, Decreased strength  Visit Diagnosis: Stiffness of left shoulder, not elsewhere classified  Disorder of the skin and subcutaneous tissue related to radiation, unspecified  Abnormal posture     Problem List Patient Active Problem List   Diagnosis Date Noted  . S/P breast reconstruction, bilateral 07/21/2019  . Acquired absence of breast 05/22/2019  . Breast cancer ([place]) 05/14/2019  . Malignant neoplasm of lower-inner quadrant of left breast in female, estrogen receptor positive ([place]) 03/24/2019  . Allergic dermatitis 12/27/2017  . Reactive airway  disease without complication 89/37/3428  . Allergic rhinitis 11/28/2016  . Hyperlipidemia 11/28/2016    [name] [facility] 11/05/2019, 3:03 PM  [place], [place], [address] Phone: [phone]   Fax:  [phone]  Name: Daurice Ovando MRN: [id number] Date of Birth: 08-19-1970  Manus Gunning, PT 11/05/19 3:03 PM

## 2019-11-10 ENCOUNTER — Ambulatory Visit: Payer: BC Managed Care – PPO

## 2019-11-10 ENCOUNTER — Other Ambulatory Visit: Payer: Self-pay

## 2019-11-10 DIAGNOSIS — M25612 Stiffness of left shoulder, not elsewhere classified: Secondary | ICD-10-CM

## 2019-11-10 DIAGNOSIS — C50312 Malignant neoplasm of lower-inner quadrant of left female breast: Secondary | ICD-10-CM

## 2019-11-10 DIAGNOSIS — L599 Disorder of the skin and subcutaneous tissue related to radiation, unspecified: Secondary | ICD-10-CM

## 2019-11-10 DIAGNOSIS — Z170 Estrogen receptor positive status [ER+]: Secondary | ICD-10-CM

## 2019-11-10 DIAGNOSIS — R293 Abnormal posture: Secondary | ICD-10-CM

## 2019-11-10 DIAGNOSIS — M25611 Stiffness of right shoulder, not elsewhere classified: Secondary | ICD-10-CM

## 2019-11-10 DIAGNOSIS — Z9013 Acquired absence of bilateral breasts and nipples: Secondary | ICD-10-CM

## 2019-11-10 NOTE — Therapy (Signed)
[place], [place], [address] Phone: [phone]   Fax:  [phone]  Physical Therapy Treatment  Patient Details  Name: Alice Davis MRN: [id number] Date of Birth: 1971-04-22 Referring Provider (PT): Reita May Date: 11/10/2019   PT End of Session - 11/10/19 1105    Visit Number 3    Number of Visits 9    Date for PT Re-Evaluation 12/01/19    PT Start Time 1105    PT Stop Time 1200    PT Time Calculation (min) 55 min    Activity Tolerance Patient tolerated treatment well    Behavior During Therapy [facility] for tasks assessed/performed           Past Medical History:  Diagnosis Date  . Asthma   . Cancer [facility])     Past Surgical History:  Procedure Laterality Date  . BREAST RECONSTRUCTION WITH PLACEMENT OF TISSUE EXPANDER AND FLEX HD (ACELLULAR HYDRATED DERMIS) Bilateral 05/14/2019   Procedure: IMMEDIATE BREAST RECONSTRUCTION WITH PLACEMENT OF TISSUE EXPANDER AND FLEX HD (ACELLULAR HYDRATED DERMIS);  Surgeon: Wallace Going, DO;  Location: [place];  Service: Plastics;  Laterality: Bilateral;  . NASAL SINUS SURGERY     20 years ago  . NIPPLE SPARING MASTECTOMY WITH SENTINEL LYMPH NODE BIOPSY Bilateral 05/14/2019   Procedure: BILATERAL NIPPLE SPARING MASTECTOMIES WITH LEFT SENTINEL LYMPH NODE BIOPSY;  Surgeon: [name], MD;  Location: [place];  Service: General;  Laterality: Bilateral;  PEC  . REMOVAL OF BILATERAL TISSUE EXPANDERS WITH PLACEMENT OF BILATERAL BREAST IMPLANTS Bilateral 07/13/2019   Procedure: REMOVAL OF BILATERAL TISSUE EXPANDERS WITH PLACEMENT OF BILATERAL BREAST IMPLANTS;  Surgeon: Wallace Going, DO;  Location: [place];  Service: Plastics;  Laterality: Bilateral;  . RHINOPLASTY     20 years  . WISDOM TOOTH EXTRACTION      There were no vitals filed for this visit.   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, grade 2, margins negative, 1/3 lymph node positive ER 80-95 %, PR 80-95 %, HER-2 negative, Ki-67 2-20%right mastectomy: Benign; then bil implant exchange July 13, 2019, pt completed radiation 09/25/19, pt currently tamoxifen                             [facility] Adult PT Treatment/Exercise - 11/10/19 0001      Manual Therapy   Manual Therapy Soft tissue mobilization;Passive ROM;Myofascial release    Soft tissue mobilization petrissage along the scar tissue from the superior/lateral aspect of the L reconstruction and along cording into mid medial L brachium with movement into flexion and abduction, L triceps, L lateral border of the scapula; moderate improvement following STM with less prominent scar tissue/cording with greater abduction and flexion ROM.     Myofascial Release along the anterior chest wall, lateral chest wall with cross hand prolonged stretch.     Passive ROM P/ROM into flexion and abduction with intermittent STM and myofascial release.                   PT Education - 11/10/19 1205    Education Details Pt will continue with current HEP at home. She will stretch at home to help loosen up scar tissue in the L axilla and along the lateral trunk.    Person(s)  Educated Patient    Methods Explanation    Comprehension Verbalized understanding               PT Long Term Goals - 11/03/19 1350      PT LONG TERM GOAL #1   Title Pt will demonstrate 160 degrees of left shoulder flexion to allow pt to reach overhead    Baseline 152    Time 4    Period Weeks    Status New    Target Date 12/01/19      PT LONG TERM GOAL #2   Title Pt will demonstratse 160 degrees of left shoulder abduction to allow pt to reach out to the sides    Baseline 147    Time 4    Period Weeks    Status New    Target Date 12/01/19      PT LONG TERM GOAL #3   Title Pt  will report a 75% improvement in tightness and discomfort in left lateral trunk with end range shoulder ROM for improved comfort.    Time 4    Period Weeks    Status New    Target Date 12/01/19      PT LONG TERM GOAL #4   Title Pt will be independent in a home exercise program for continued strengthening and stretching    Time 4    Period Weeks    Status New    Target Date 12/01/19                 Plan - 11/10/19 1105    Clinical Impression Statement Pt presents to physical therapy with continued tightness/decreased ROM at the L shoulder compared to the R. She demonstrates greatest limitations in external rotation and with abduction. Cording noted in the L axilla from lateral border of reconstruction to mid medial brachium and scar tissue from superior/lateral corner of implant to below the deltoid; moderate improvement following myofascial release and petrisage with passive movement as demonstrated by decreased prominence. Pt had increased ROM and less reports of tightness following myofascial release to the anterior and lateral trunk. She performed flexion in standing with her R hand much higher than her L but what appeared similar ROM demonstrating possible scapulo-humeral rhythm difficulty; possibly requires assistance with scapular stabilization and motor control for improved equal shoulder mobility bil. Pt will benefit from continued POC at this time.    Examination-Activity Limitations Reach Overhead    Rehab Potential Good    PT Frequency 2x / week    PT Duration 4 weeks    PT Treatment/Interventions ADLs/Self Care Home Management;Manual techniques;Patient/family education;Therapeutic exercise;Passive range of motion;Manual lymph drainage;Vasopneumatic Device;Scar mobilization    PT Next Visit Plan instruct in supine scapular strengthening exercises, MFR and STM to L lateral/posterior trunk in area of tightness, ROM exercises L shoulder    PT Home Exercise Plan supine dowel  exercises    Consulted and Agree with Plan of Care Patient           Patient will benefit from skilled therapeutic intervention in order to improve the following deficits and impairments:  Decreased range of motion, Postural dysfunction, Decreased scar mobility, Impaired UE functional use, Increased fascial restricitons, Decreased strength  Visit Diagnosis: Stiffness of left shoulder, not elsewhere classified  Disorder of the skin and subcutaneous tissue related to radiation, unspecified  Abnormal posture  Acquired absence of both breasts  Malignant neoplasm of lower-inner quadrant of left breast in female, estrogen receptor positive ([place])  Stiffness of right shoulder, not elsewhere classified     Problem List Patient Active Problem List   Diagnosis Date Noted  . S/P breast reconstruction, bilateral 07/21/2019  . Acquired absence of breast 05/22/2019  . Breast cancer ([place]) 05/14/2019  . Malignant neoplasm of lower-inner quadrant of left breast in female, estrogen receptor positive ([place]) 03/24/2019  . Allergic dermatitis 12/27/2017  . Reactive airway disease without complication 28/83/3744  . Allergic rhinitis 11/28/2016  . Hyperlipidemia 11/28/2016    Ander Purpura, PT 11/10/2019, 12:09 PM  [place], [place], [address] Phone: [phone]   Fax:  [phone]  Name: Kiandra Sanguinetti MRN: [id number] Date of Birth: 22-Oct-1970

## 2019-11-12 ENCOUNTER — Other Ambulatory Visit: Payer: Self-pay

## 2019-11-12 ENCOUNTER — Encounter (INDEPENDENT_AMBULATORY_CARE_PROVIDER_SITE_OTHER): Payer: Self-pay

## 2019-11-12 ENCOUNTER — Ambulatory Visit: Payer: BC Managed Care – PPO

## 2019-11-12 DIAGNOSIS — M25612 Stiffness of left shoulder, not elsewhere classified: Secondary | ICD-10-CM | POA: Diagnosis not present

## 2019-11-12 DIAGNOSIS — M25611 Stiffness of right shoulder, not elsewhere classified: Secondary | ICD-10-CM

## 2019-11-12 DIAGNOSIS — Z170 Estrogen receptor positive status [ER+]: Secondary | ICD-10-CM

## 2019-11-12 DIAGNOSIS — Z9013 Acquired absence of bilateral breasts and nipples: Secondary | ICD-10-CM

## 2019-11-12 DIAGNOSIS — L599 Disorder of the skin and subcutaneous tissue related to radiation, unspecified: Secondary | ICD-10-CM

## 2019-11-12 DIAGNOSIS — R293 Abnormal posture: Secondary | ICD-10-CM

## 2019-11-12 NOTE — Therapy (Signed)
[place], [place], [address] Phone: [phone]   Fax:  [phone]  Physical Therapy Treatment  Patient Details  Name: Alice Davis MRN: [id number] Date of Birth: 01-Jun-1970 Referring Provider (PT): Reita May Date: 11/12/2019   PT End of Session - 11/12/19 1700    Visit Number 4    Number of Visits 9    Date for PT Re-Evaluation 12/01/19    PT Start Time 1509    PT Stop Time 1605    PT Time Calculation (min) 56 min    Activity Tolerance Patient tolerated treatment well    Behavior During Therapy [facility] for tasks assessed/performed           Past Medical History:  Diagnosis Date  . Asthma   . Cancer [facility])     Past Surgical History:  Procedure Laterality Date  . BREAST RECONSTRUCTION WITH PLACEMENT OF TISSUE EXPANDER AND FLEX HD (ACELLULAR HYDRATED DERMIS) Bilateral 05/14/2019   Procedure: IMMEDIATE BREAST RECONSTRUCTION WITH PLACEMENT OF TISSUE EXPANDER AND FLEX HD (ACELLULAR HYDRATED DERMIS);  Surgeon: Wallace Going, DO;  Location: [place];  Service: Plastics;  Laterality: Bilateral;  . NASAL SINUS SURGERY     20 years ago  . NIPPLE SPARING MASTECTOMY WITH SENTINEL LYMPH NODE BIOPSY Bilateral 05/14/2019   Procedure: BILATERAL NIPPLE SPARING MASTECTOMIES WITH LEFT SENTINEL LYMPH NODE BIOPSY;  Surgeon: [name], MD;  Location: [place];  Service: General;  Laterality: Bilateral;  PEC  . REMOVAL OF BILATERAL TISSUE EXPANDERS WITH PLACEMENT OF BILATERAL BREAST IMPLANTS Bilateral 07/13/2019   Procedure: REMOVAL OF BILATERAL TISSUE EXPANDERS WITH PLACEMENT OF BILATERAL BREAST IMPLANTS;  Surgeon: Wallace Going, DO;  Location: [place];  Service: Plastics;  Laterality: Bilateral;  . RHINOPLASTY     20 years  . WISDOM TOOTH EXTRACTION      There were no vitals filed for this visit.   Subjective Assessment - 11/12/19 1512    Subjective Pt states that she feels a  little bruised and sore. When she moves her arm it is about a 3/10 pain.    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 Yes    Pain Score 3     Pain Location Axilla    Pain Orientation Left    Pain Type Surgical pain    Pain Radiating Towards toward the L breast    Pain Onset More than a month ago                             [facility] Adult PT Treatment/Exercise - 11/12/19 0001      Manual Therapy   Manual Therapy Soft tissue mobilization;Passive ROM;Myofascial release    Soft tissue mobilization petrissage along the scar tissue from the superior/lateral aspect of the L reconstruction and along cording into distal axilla with movement into flexion and abduction, multiple times    Myofascial Release along the anterior chest wall, lateral chest wall with cross hand prolonged stretch.     Passive ROM P/ROM into flexion and  abduction with intermittent STM and myofascial release.                   PT Education - 11/12/19 1616    Education Details Pt will continue with current HEP at home. She will stretch in the shower to help decrease scar tissue.    Person(s) Educated Patient    Methods Explanation    Comprehension Verbalized understanding               PT Long Term Goals - 11/03/19 1350      PT LONG TERM GOAL #1   Title Pt will demonstrate 160 degrees of left shoulder flexion to allow pt to reach overhead    Baseline 152    Time 4    Period Weeks    Status New    Target Date 12/01/19      PT LONG TERM GOAL #2   Title Pt will demonstratse 160  degrees of left shoulder abduction to allow pt to reach out to the sides    Baseline 147    Time 4    Period Weeks    Status New    Target Date 12/01/19      PT LONG TERM GOAL #3   Title Pt will report a 75% improvement in tightness and discomfort in left lateral trunk with end range shoulder ROM for improved comfort.    Time 4    Period Weeks    Status New    Target Date 12/01/19      PT LONG TERM GOAL #4   Title Pt will be independent in a home exercise program for continued strengthening and stretching    Time 4    Period Weeks    Status New    Target Date 12/01/19                 Plan - 11/12/19 1511    Clinical Impression Statement Pt continues with tightness/decreased ROM at the L shoulder compared to the R. Significant improvement in pain-free ROM this session compared to last session. Initially with easy longitudinal myofascial release pt had 3 pops in the L lateral trunk near the lateral aspect of the L breast; she has an area that tends to roll in this area as well near her implant. Pt continues with uneven internal rotation/abduction and elevated RUE compared to LUE in A/ROM of the shoulder standing. Next session assess scapulohumeral rhythm and scapular mobility; quick assessment no significant findings were apparent but this needs further assessment to see if this is what is hindering pt full ROM. Continued with petrissage of scar tissue and pectoralis major with P/ROM. Pt will benefit from continued POC at this time.    Examination-Activity Limitations Reach Overhead    Rehab Potential Good    PT Frequency 2x / week    PT Duration 4 weeks    PT Treatment/Interventions ADLs/Self Care Home Management;Manual techniques;Patient/family education;Therapeutic exercise;Passive range of motion;Manual lymph drainage;Vasopneumatic Device;Scar mobilization    PT Next Visit Plan look at periscapular musculature and scapulohumeral rhythm on the L, instruct in supine scapular  strengthening exercises, MFR and STM to L lateral/posterior trunk in area of tightness, ROM exercises L shoulder    PT Home Exercise Plan supine dowel exercises    Consulted and Agree with Plan of Care Patient           Patient will benefit from skilled therapeutic intervention in order to improve the following deficits and impairments:  Decreased range of motion, Postural dysfunction, Decreased scar mobility, Impaired UE functional use, Increased fascial restricitons, Decreased strength  Visit Diagnosis: Stiffness of left shoulder, not elsewhere classified  Disorder of the skin and subcutaneous tissue related to radiation, unspecified  Abnormal posture  Acquired absence of both breasts  Malignant neoplasm of lower-inner quadrant of left breast in female, estrogen receptor positive (HCC)  Stiffness of right shoulder, not elsewhere classified     Problem List Patient Active Problem List   Diagnosis Date Noted  . S/P breast reconstruction, bilateral 07/21/2019  . Acquired absence of breast 05/22/2019  . Breast cancer ([place]) 05/14/2019  . Malignant neoplasm of lower-inner quadrant of left breast in female, estrogen receptor positive ([place]) 03/24/2019  . Allergic dermatitis 12/27/2017  . Reactive airway disease without complication 64/29/0379  . Allergic rhinitis 11/28/2016  . Hyperlipidemia 11/28/2016    Ander Purpura , PT 11/12/2019, 5:15 PM  [place], [place], [address] Phone: [phone]   Fax:  [phone]  Name: Alice Davis MRN: [id number] Date of Birth: July 14, 1970

## 2019-11-17 ENCOUNTER — Ambulatory Visit: Payer: BC Managed Care – PPO | Admitting: Physical Therapy

## 2019-11-17 ENCOUNTER — Other Ambulatory Visit: Payer: Self-pay

## 2019-11-17 DIAGNOSIS — L599 Disorder of the skin and subcutaneous tissue related to radiation, unspecified: Secondary | ICD-10-CM

## 2019-11-17 DIAGNOSIS — R293 Abnormal posture: Secondary | ICD-10-CM

## 2019-11-17 DIAGNOSIS — M25612 Stiffness of left shoulder, not elsewhere classified: Secondary | ICD-10-CM

## 2019-11-17 NOTE — Therapy (Signed)
[place], [place], [address] Phone: [phone]   Fax:  [phone]  Physical Therapy Treatment  Patient Details  Name: Alice Davis MRN: [id number] Date of Birth: 23-Jul-1970 Referring Provider (PT): Reita May Date: 11/17/2019   PT End of Session - 11/17/19 1955    Visit Number 5    Number of Visits 9    Date for PT Re-Evaluation 12/01/19    PT Start Time 1510    PT Stop Time 1600    PT Time Calculation (min) 50 min    Activity Tolerance Patient tolerated treatment well    Behavior During Therapy [facility] for tasks assessed/performed           Past Medical History:  Diagnosis Date  . Asthma   . Cancer [facility])     Past Surgical History:  Procedure Laterality Date  . BREAST RECONSTRUCTION WITH PLACEMENT OF TISSUE EXPANDER AND FLEX HD (ACELLULAR HYDRATED DERMIS) Bilateral 05/14/2019   Procedure: IMMEDIATE BREAST RECONSTRUCTION WITH PLACEMENT OF TISSUE EXPANDER AND FLEX HD (ACELLULAR HYDRATED DERMIS);  Surgeon: Wallace Going, DO;  Location: [place];  Service: Plastics;  Laterality: Bilateral;  . NASAL SINUS SURGERY     20 years ago  . NIPPLE SPARING MASTECTOMY WITH SENTINEL LYMPH NODE BIOPSY Bilateral 05/14/2019   Procedure: BILATERAL NIPPLE SPARING MASTECTOMIES WITH LEFT SENTINEL LYMPH NODE BIOPSY;  Surgeon: [name], MD;  Location: [place];  Service: General;  Laterality: Bilateral;  PEC  . REMOVAL OF BILATERAL TISSUE EXPANDERS WITH PLACEMENT OF BILATERAL BREAST IMPLANTS Bilateral 07/13/2019   Procedure: REMOVAL OF BILATERAL TISSUE EXPANDERS WITH PLACEMENT OF BILATERAL BREAST IMPLANTS;  Surgeon: Wallace Going, DO;  Location: [place];  Service: Plastics;  Laterality: Bilateral;  . RHINOPLASTY     20 years  . WISDOM TOOTH EXTRACTION      There were no vitals filed for this visit.   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 No/denies   no I'm just tight                            [facility] Adult PT Treatment/Exercise - 11/17/19 0001      Exercises   Exercises Shoulder      Shoulder Exercises: Standing   External Rotation Strengthening;Right;Left;5 reps;Theraband   isometric    Theraband Level (Shoulder External Rotation) Level 3 (Green)      Shoulder Exercises: Stretch   Other Shoulder Stretches modifed downward dog stretch on wall with attention to reach back with hips to stretch axilla and get thoracic extension , also did open book stretch       Manual Therapy   Manual Therapy Soft tissue mobilization;Passive ROM;Myofascial release    Soft tissue mobilization petrissage along the scar tissue from the superior/lateral aspect of the L reconstruction and along cording into distal axilla with movement into flexion and abduction, multiple times  care taken to protoect skin along incision    Myofascial Release along the anterior chest wall, lateral chest wall with cross hand prolonged stretch.     Passive ROM P/ROM into flexion and abduction with intermittent STM and myofascial release.                        PT Long Term Goals - 11/03/19 1350      PT LONG TERM GOAL #1   Title Pt will demonstrate 160 degrees of left shoulder flexion to allow pt to reach overhead    Baseline 152    Time 4    Period Weeks    Status New    Target Date 12/01/19      PT LONG TERM GOAL #2   Title Pt will demonstratse 160  degrees of left shoulder abduction to allow pt to reach out to the sides    Baseline 147    Time 4    Period Weeks    Status New    Target Date 12/01/19      PT LONG TERM GOAL #3   Title Pt will report a 75% improvement in tightness and discomfort in left lateral trunk with end range shoulder ROM for improved comfort.    Time 4    Period Weeks    Status New    Target Date 12/01/19      PT LONG TERM GOAL #4   Title Pt will be independent in a home exercise program for continued strengthening and stretching    Time 4    Period Weeks    Status New    Target Date 12/01/19                 Plan - 11/17/19 1956    Clinical Impression Statement Pt is doing well with left shoulder ROM , but continues to have tight thick band of coreding deep in axilla. She feels significant tightness in this particular area limiting certain shoulder movments and would benefit from continues manual work to this area. She would alos benefit from Strength ABC program as she has been an exerciser at the gym before and is famliar with exercises. She needs a prophylactic sleeve prior to discharge    Stability/Clinical Decision Making Stable/Uncomplicated    PT Frequency 2x / week    PT Duration 4 weeks    PT Treatment/Interventions ADLs/Self Care Home Management;Manual techniques;Patient/family education;Therapeutic exercise;Passive range of motion;Manual lymph drainage;Vasopneumatic Device;Scar mobilization    PT Next Visit Plan focuse on manual work on tight cord in axilla. Teach Strength ABC. look at periscapular musculature and scapulohumeral rhythm on the L, instruct in supine scapular strengthening exercises, MFR and STM to L lateral/posterior trunk in area of tightness, ROM exercises L shoulder    Consulted and Agree with Plan of Care Patient           Patient will benefit from skilled therapeutic intervention in order to improve the following deficits and impairments:  Decreased range of motion,  Postural dysfunction, Decreased scar mobility, Impaired UE functional use, Increased fascial restricitons, Decreased strength  Visit Diagnosis: Stiffness of left shoulder, not elsewhere classified  Disorder of the skin and subcutaneous tissue related to radiation, unspecified  Abnormal posture     Problem List Patient Active Problem List   Diagnosis Date Noted  . S/P breast reconstruction, bilateral 07/21/2019  . Acquired absence of breast 05/22/2019  . Breast cancer ([place]) 05/14/2019  . Malignant neoplasm  of lower-inner quadrant of left breast in female, estrogen receptor positive ([place]) 03/24/2019  . Allergic dermatitis 12/27/2017  . Reactive airway disease without complication 48/25/0037  . Allergic rhinitis 11/28/2016  . Hyperlipidemia 11/28/2016   Donato Heinz. Owens Shark PT  Norwood Levo 11/17/2019, 8:01 PM  [place], [place], [address] Phone: [phone]   Fax:  [phone]  Name: Alice Davis MRN: [id number] Date of Birth: 1970/10/12

## 2019-11-19 ENCOUNTER — Ambulatory Visit: Payer: BC Managed Care – PPO | Admitting: Physical Therapy

## 2019-11-19 ENCOUNTER — Other Ambulatory Visit: Payer: Self-pay

## 2019-11-19 ENCOUNTER — Encounter: Payer: Self-pay | Admitting: Physical Therapy

## 2019-11-19 DIAGNOSIS — M25612 Stiffness of left shoulder, not elsewhere classified: Secondary | ICD-10-CM | POA: Diagnosis not present

## 2019-11-19 DIAGNOSIS — L599 Disorder of the skin and subcutaneous tissue related to radiation, unspecified: Secondary | ICD-10-CM

## 2019-11-19 DIAGNOSIS — R293 Abnormal posture: Secondary | ICD-10-CM

## 2019-11-19 NOTE — Therapy (Signed)
[place], [place], [address] Phone: [phone]   Fax:  [phone]  Physical Therapy Treatment  Patient Details  Name: Alice Davis MRN: [id number] Date of Birth: Nov 18, 1970 Referring Micha Dosanjh (PT): Reita May Date: 11/19/2019   PT End of Session - 11/19/19 1727    Visit Number 6    Number of Visits 9    Date for PT Re-Evaluation 12/01/19    PT Start Time 1600    PT Stop Time 1645    PT Time Calculation (min) 45 min    Activity Tolerance Patient tolerated treatment well    Behavior During Therapy [facility] for tasks assessed/performed           Past Medical History:  Diagnosis Date  . Asthma   . Cancer [facility])     Past Surgical History:  Procedure Laterality Date  . BREAST RECONSTRUCTION WITH PLACEMENT OF TISSUE EXPANDER AND FLEX HD (ACELLULAR HYDRATED DERMIS) Bilateral 05/14/2019   Procedure: IMMEDIATE BREAST RECONSTRUCTION WITH PLACEMENT OF TISSUE EXPANDER AND FLEX HD (ACELLULAR HYDRATED DERMIS);  Surgeon: Wallace Going, DO;  Location: [place];  Service: Plastics;  Laterality: Bilateral;  . NASAL SINUS SURGERY     20 years ago  . NIPPLE SPARING MASTECTOMY WITH SENTINEL LYMPH NODE BIOPSY Bilateral 05/14/2019   Procedure: BILATERAL NIPPLE SPARING MASTECTOMIES WITH LEFT SENTINEL LYMPH NODE BIOPSY;  Surgeon: [name], MD;  Location: [place];  Service: General;  Laterality: Bilateral;  PEC  . REMOVAL OF BILATERAL TISSUE EXPANDERS WITH PLACEMENT OF BILATERAL BREAST IMPLANTS Bilateral 07/13/2019   Procedure: REMOVAL OF BILATERAL TISSUE EXPANDERS WITH PLACEMENT OF BILATERAL BREAST IMPLANTS;  Surgeon: Wallace Going, DO;  Location: [place];  Service: Plastics;  Laterality: Bilateral;  . RHINOPLASTY     20 years  . WISDOM TOOTH EXTRACTION      There were no vitals filed for this visit.   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 Pt felt she would be able to follow it at home .      Manual Therapy   Soft tissue mobilization petrissage along the scar tissue from the superior/lateral aspect of the L reconstruction and along cording into distal axilla with movement into flexion and abduction, multiple times   care taken to protoect skin along incision    Passive ROM P/ROM into flexion and abduction with intermittent STM and myofascial release.                        PT Long Term Goals - 11/03/19 1350      PT LONG TERM GOAL #1  Title Pt will demonstrate 160 degrees of left shoulder flexion to allow pt to reach overhead    Baseline 152    Time 4    Period Weeks    Status New    Target Date 12/01/19      PT LONG TERM GOAL #2   Title Pt will demonstratse 160 degrees of left shoulder abduction to allow pt to reach out to the sides    Baseline 147    Time 4    Period Weeks    Status New    Target Date 12/01/19      PT LONG TERM GOAL #3   Title Pt will report a 75% improvement in tightness and discomfort in left lateral trunk with end range shoulder ROM for improved comfort.    Time 4    Period Weeks    Status New    Target Date 12/01/19      PT LONG TERM GOAL #4   Title Pt will be independent in a home exercise program for  continued strengthening and stretching    Time 4    Period Weeks    Status New    Target Date 12/01/19                 Plan - 11/19/19 1727    Clinical Impression Statement Pt had good release with manual work to band in axilla today. She will follow up on exercise at home.  Script not back yet but gave pt information to contact [facility] to have sleeve fitted. Updated HEP with Strength ABC program, isometric external rotation and L stretch    Examination-Activity Limitations Reach Overhead    Stability/Clinical Decision Making Stable/Uncomplicated    Rehab Potential Good    PT Frequency 2x / week    PT Duration 4 weeks    PT Treatment/Interventions ADLs/Self Care Home Management;Manual techniques;Patient/family education;Therapeutic exercise;Passive range of motion;Manual lymph drainage;Vasopneumatic Device;Scar mobilization    PT Next Visit Plan focuse on manual work on tight cord in axilla check goals and discharge           Patient will benefit from skilled therapeutic intervention in order to improve the following deficits and impairments:  Decreased range of motion, Postural dysfunction, Decreased scar mobility, Impaired UE functional use, Increased fascial restricitons, Decreased strength  Visit Diagnosis: Stiffness of left shoulder, not elsewhere classified  Disorder of the skin and subcutaneous tissue related to radiation, unspecified  Abnormal posture     Problem List Patient Active Problem List   Diagnosis Date Noted  . S/P breast reconstruction, bilateral 07/21/2019  . Acquired absence of breast 05/22/2019  . Breast cancer ([place]) 05/14/2019  . Malignant neoplasm of lower-inner quadrant of left breast in female, estrogen receptor positive ([place]) 03/24/2019  . Allergic dermatitis 12/27/2017  . Reactive airway disease without complication 09/81/1914  . Allergic rhinitis 11/28/2016  . Hyperlipidemia 11/28/2016   Donato Heinz. Owens Shark PT  Norwood Levo 11/19/2019, 5:30 PM  [place], [place], [address] Phone: [phone]   Fax:  [phone]  Name: Shatasha Lambing MRN: [id number] Date of Birth: 1970-11-02

## 2019-11-19 NOTE — Patient Instructions (Addendum)
First of all, check with your insurance company to see if provider is in [place] (for wigs and compression sleeves / gloves/gauntlets )  [place], [place] [address] [phone]  Will file some insurances --- call for appointment   Second to [facility] (for mastectomy prosthetics and garments) [place], [place] [address] [phone] Will file some insurances --- call for appointment  [facility]  [address]  [place], [place] [address] [phone] Lower extremity garments  [facility] [place], [place]  [address] [place] ( Medicaid certified lymphedema fitter) [phone] [email or web address]  [place]  [address], [place] [address] [phone]  Other Resources: National Lymphedema Network:  www.lymphnet.org www.Klosetraining.com for patient articles and self manual lymph drainage information www.lymphedemablog.com has informative articles.  DishTag.[email or web address].com www.lymphedemaproducts.com www.brightlifedirect.com [email or web address]    Access Code: [email or web address] URL: [email or web address] Date: 11/19/2019 Prepared by: [name]  Exercises Shoulder External Rotation and Scapular Retraction with Resistance - 1 x daily - 7 x weekly - 3 sets - 10 reps Standing 'L' Stretch at Counter - 1 x daily - 7 x weekly - 3 sets - 10 reps

## 2019-11-22 NOTE — Progress Notes (Signed)
  Patient Name: Alice Davis MRN: [id number] DOB: 17-Dec-1970 Referring Physician: Horald Pollen (Profile Not Attached) Date of Service: 09/25/2019 [facility], [place]                                                        End Of Treatment Note  Diagnoses: C50.312-Malignant neoplasm of lower-inner quadrant of left female breast  Cancer Staging: Cancer Staging Malignant neoplasm of lower-inner quadrant of left breast in female, estrogen receptor positive ([place]) Staging form: Breast, AJCC 8th Edition - Clinical stage from 03/31/2019: Stage IA (cT1b, cN0, cM0, G2, ER+, PR+, HER2-) - Signed by [name], MD on 06/23/2019 - Pathologic stage from 05/21/2019: Stage IB (pT2, pN1a, cM0, G2, ER+, PR+, HER2-) - Signed by [name], MD on 05/21/2019  Intent: Curative  Radiation Treatment Dates: 08/12/2019 through 09/25/2019 Site Technique Total Dose (Gy) Dose per Fx (Gy) Completed Fx Beam Energies  Chest Wall, Left: CW_Lt 3D 50.4/50.4 1.8 28/28 6X  Chest Wall, Left: CW_Lt_SCV_PAB 3D 50.4/50.4 1.8 28/28 6X, 10X  Chest Wall, Left: CW_Lt_Bst Electron 10/10 2 5/5 6E   Narrative: The patient tolerated radiation therapy relatively well.   Plan: The patient will follow-up with radiation oncology in 1 mo. -----------------------------------  [name], MD

## 2019-11-24 ENCOUNTER — Other Ambulatory Visit: Payer: Self-pay

## 2019-11-24 ENCOUNTER — Ambulatory Visit: Payer: BC Managed Care – PPO | Admitting: Physical Therapy

## 2019-11-24 DIAGNOSIS — M25612 Stiffness of left shoulder, not elsewhere classified: Secondary | ICD-10-CM

## 2019-11-24 DIAGNOSIS — L599 Disorder of the skin and subcutaneous tissue related to radiation, unspecified: Secondary | ICD-10-CM

## 2019-11-24 DIAGNOSIS — R293 Abnormal posture: Secondary | ICD-10-CM

## 2019-11-24 NOTE — Therapy (Signed)
[place], [place], [address] Phone: [phone]   Fax:  [phone]  Physical Therapy Treatment  Patient Details  Name: Elyssa Pendelton MRN: [id number] Date of Birth: 1970-10-04 Referring Provider (PT): Reita May Date: 11/24/2019   PT End of Session - 11/24/19 1959    Visit Number 7    Number of Visits 9    Date for PT Re-Evaluation 12/01/19    PT Start Time 1600    PT Stop Time 1645    PT Time Calculation (min) 45 min    Activity Tolerance Patient tolerated treatment well    Behavior During Therapy [facility] for tasks assessed/performed           Past Medical History:  Diagnosis Date  . Asthma   . Cancer [facility])     Past Surgical History:  Procedure Laterality Date  . BREAST RECONSTRUCTION WITH PLACEMENT OF TISSUE EXPANDER AND FLEX HD (ACELLULAR HYDRATED DERMIS) Bilateral 05/14/2019   Procedure: IMMEDIATE BREAST RECONSTRUCTION WITH PLACEMENT OF TISSUE EXPANDER AND FLEX HD (ACELLULAR HYDRATED DERMIS);  Surgeon: Wallace Going, DO;  Location: [place];  Service: Plastics;  Laterality: Bilateral;  . NASAL SINUS SURGERY     20 years ago  . NIPPLE SPARING MASTECTOMY WITH SENTINEL LYMPH NODE BIOPSY Bilateral 05/14/2019   Procedure: BILATERAL NIPPLE SPARING MASTECTOMIES WITH LEFT SENTINEL LYMPH NODE BIOPSY;  Surgeon: [name], MD;  Location: [place];  Service: General;  Laterality: Bilateral;  PEC  . REMOVAL OF BILATERAL TISSUE EXPANDERS WITH PLACEMENT OF BILATERAL BREAST IMPLANTS Bilateral 07/13/2019   Procedure: REMOVAL OF BILATERAL TISSUE EXPANDERS WITH PLACEMENT OF BILATERAL BREAST IMPLANTS;  Surgeon: Wallace Going, DO;  Location: [place];  Service: Plastics;  Laterality: Bilateral;  . RHINOPLASTY     20 years  . WISDOM TOOTH EXTRACTION      There were no vitals filed for this visit.   Subjective Assessment - 11/24/19 1958    Subjective Pt still has tightness in  left axilla and chest    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, grade 2, margins negative, 1/3 lymph node positive ER 80-95 %, PR 80-95 %, HER-2 negative, Ki-67 2-20%right mastectomy: Benign; then bil implant exchange July 13, 2019, pt completed radiation 09/25/19, pt currently tamoxifen    Patient Stated Goals to get better ROM and decrease tightness    Currently in Pain? No/denies              [facility] PT Assessment - 11/24/19 0001      AROM   Left Shoulder Flexion 168 Degrees    Left Shoulder ABduction 175 Degrees                         [facility] Adult PT Treatment/Exercise - 11/24/19 0001      Shoulder Exercises: Sidelying   Other Sidelying Exercises hand across chest for backward thoracic rotation stretch, educated not to stretch with arm extended due to pressure it put on implant       Manual Therapy   Soft tissue mobilization with coconut oil, soft tissue work to soften pec major and tight tissue in axilla     Myofascial Release along the anterior chest wall, lateral chest wall with cross hand prolonged stretch.     Passive ROM P/ROM into flexion and abduction with intermittent STM and myofascial release.  PT Long Term Goals - 11/24/19 1646      PT LONG TERM GOAL #1   Title Pt will demonstrate 160 degrees of left shoulder flexion to allow pt to reach overhead    Baseline 152, 168 on 11/24/2019    Status Achieved      PT LONG TERM GOAL #2   Title Pt will demonstratse 160 degrees of left shoulder abduction to allow pt to reach out to the sides    Baseline 147, 175 on 11/24/2019    Status Achieved      PT LONG TERM GOAL #3   Title Pt will report a 75% improvement in tightness and discomfort in left lateral trunk with end range shoulder ROM for improved comfort.    Status Achieved      PT LONG TERM GOAL #4   Title Pt will be  independent in a home exercise program for continued strengthening and stretching    Status Achieved                 Plan - 11/24/19 1959    Clinical Impression Statement Pt continues to improve but still has tightness in left axilla and pec major that softened and released with soft tissue work. Pt has met her PT goals. She wants to keep the episode open for possibly one more visit as she does get relief from tightness with soft tissue work.  She will continue stretches and exercise at home    Examination-Activity Limitations Reach Overhead    Stability/Clinical Decision Making Stable/Uncomplicated    Rehab Potential Good    PT Frequency 2x / week    PT Duration 4 weeks    PT Treatment/Interventions ADLs/Self Care Home Management;Manual techniques;Patient/family education;Therapeutic exercise;Passive range of motion;Manual lymph drainage;Vasopneumatic Device;Scar mobilization    PT Next Visit Plan focuse on manual work on tight cord in axilla check goals and discharge    Consulted and Agree with Plan of Care Patient           Patient will benefit from skilled therapeutic intervention in order to improve the following deficits and impairments:  Decreased range of motion, Postural dysfunction, Decreased scar mobility, Impaired UE functional use, Increased fascial restricitons, Decreased strength  Visit Diagnosis: Stiffness of left shoulder, not elsewhere classified  Disorder of the skin and subcutaneous tissue related to radiation, unspecified  Abnormal posture     Problem List Patient Active Problem List   Diagnosis Date Noted  . S/P breast reconstruction, bilateral 07/21/2019  . Acquired absence of breast 05/22/2019  . Breast cancer ([place]) 05/14/2019  . Malignant neoplasm of lower-inner quadrant of left breast in female, estrogen receptor positive ([place]) 03/24/2019  . Allergic dermatitis 12/27/2017  . Reactive airway disease without complication 27/78/2423  . Allergic  rhinitis 11/28/2016  . Hyperlipidemia 11/28/2016   Donato Heinz. Owens Shark PT  Norwood Levo 11/24/2019, 8:02 PM  [place], [place], [address] Phone: [phone]   Fax:  [phone]  Name: Salinda Snedeker MRN: [id number] Date of Birth: December 08, 1970

## 2019-11-26 ENCOUNTER — Encounter: Payer: BC Managed Care – PPO | Admitting: Physical Therapy

## 2019-11-26 ENCOUNTER — Encounter (INDEPENDENT_AMBULATORY_CARE_PROVIDER_SITE_OTHER): Payer: Self-pay

## 2019-12-07 ENCOUNTER — Telehealth: Payer: Self-pay | Admitting: *Deleted

## 2019-12-07 NOTE — Telephone Encounter (Signed)
Upbeat Study; Called patient to confirm her appointments this Thursday 12/10/19 for the 3 months time point on the Upbeat Study.  Reminded patient to fast for at least 3 hrs prior to lab appointment. Reminded patient to arrive by 2:30 pm for Cardiac MRI.  Research nurse or assistant will plan to meet patient in lobby and escort to Radiology Department because she is unsure of where to check in for MRI. Patient confirmed appointments and verbalized understanding to fast for at least 3 hrs.  Lab is scheduled for 4 pm and pt plans to start fasting after lunch. Thanked patient for her participation and asked her to call if she cannot make this appointment for any reason.  Foye Spurling, BSN, RN Clinical Research Nurse 12/07/2019 9:57 AM

## 2019-12-09 ENCOUNTER — Telehealth: Payer: Self-pay | Admitting: *Deleted

## 2019-12-09 NOTE — Telephone Encounter (Addendum)
Upbeat Study;  Notified by MRI department that they are unable to do Cardiac MRI scheduled for 3 pm tomorrow.  They have earlier appointments available but are not able to do 3 pm as scheduled. Left VM for patient with this information and she called back.  Patient stated she was called by MRI this morning to confirm her appointment and asked for research nurse to double check.  I called MRI and spoke with Marjory Lies who confirmed they cannot complete MRI at 3 pm tomorrow. The latest they can do is 2 pm if patient can arrive to their department by 1:45 pm at the latest.  Called patient back and LVM informing her of latest appointment available would need to be at [facility] at 1:30 pm to get to MRI by 1:45 pm.  Asked patient if she can come at this time or if she would like to reschedule research appointment to another day.  Patient also has option to come in for research labs and assessments tomorrow as scheduled and complete the MRI at a different time.  Foye Spurling, BSN, RN Clinical Research Nurse 12/09/2019 3:30 PM  Patient returned call and she is unable to leave work any earlier tomorrow than already planned.  Patient agreed to reschedule her MRI and study visit to 01/01/20.  MRI scheduled for 8 am and patient aware to arrive at 7:30 am.  Research nurse plans to meet patient in lobby at [facility] and will walk her over to check in for MRI. Reminded patient to fast for at least 3 hours prior to lab appt (which will be scheduled for 9 am).  Patient verbalized understanding. Thanked patient for her understanding and will plan to see her on 8/27 at 7:30 am. Foye Spurling, BSN, RN Clinical Research Nurse 12/09/2019 4:09 PM

## 2019-12-10 ENCOUNTER — Encounter: Payer: BC Managed Care – PPO | Admitting: *Deleted

## 2019-12-10 ENCOUNTER — Encounter (INDEPENDENT_AMBULATORY_CARE_PROVIDER_SITE_OTHER): Payer: Self-pay

## 2019-12-10 ENCOUNTER — Ambulatory Visit (HOSPITAL_COMMUNITY): Admission: RE | Admit: 2019-12-10 | Payer: Self-pay | Source: Ambulatory Visit

## 2019-12-10 ENCOUNTER — Inpatient Hospital Stay: Payer: BC Managed Care – PPO

## 2019-12-10 ENCOUNTER — Inpatient Hospital Stay: Payer: BC Managed Care – PPO | Admitting: *Deleted

## 2019-12-14 ENCOUNTER — Other Ambulatory Visit: Payer: Self-pay | Admitting: *Deleted

## 2019-12-14 ENCOUNTER — Telehealth: Payer: Self-pay | Admitting: *Deleted

## 2019-12-14 MED ORDER — TAMOXIFEN CITRATE 20 MG PO TABS: 20.0000 mg | ORAL_TABLET | Freq: Every day | ORAL | 52 refills | Status: DC

## 2019-12-14 NOTE — Telephone Encounter (Signed)
Pt called regarding tamoxifen prescription. Per pt, insurance requires 7 day prescriptions. New prescription sent in for weekly 7day a week tamoxifen.

## 2019-12-17 ENCOUNTER — Encounter (INDEPENDENT_AMBULATORY_CARE_PROVIDER_SITE_OTHER): Payer: Self-pay

## 2019-12-29 ENCOUNTER — Encounter (INDEPENDENT_AMBULATORY_CARE_PROVIDER_SITE_OTHER): Payer: Self-pay

## 2019-12-30 ENCOUNTER — Encounter: Payer: Self-pay | Admitting: *Deleted

## 2019-12-30 ENCOUNTER — Inpatient Hospital Stay: Payer: BC Managed Care – PPO | Attending: Hematology and Oncology | Admitting: Adult Health

## 2019-12-30 ENCOUNTER — Encounter: Payer: Self-pay | Admitting: Adult Health

## 2019-12-30 ENCOUNTER — Other Ambulatory Visit: Payer: Self-pay

## 2019-12-30 VITALS — BP 105/57 | HR 83 | Temp 98.5°F | Resp 18 | Ht 67.5 in | Wt 139.1 lb

## 2019-12-30 DIAGNOSIS — Z170 Estrogen receptor positive status [ER+]: Secondary | ICD-10-CM | POA: Diagnosis not present

## 2019-12-30 DIAGNOSIS — C50312 Malignant neoplasm of lower-inner quadrant of left female breast: Secondary | ICD-10-CM

## 2019-12-30 MED ORDER — ERYTHROMYCIN 2 % EX GEL: Freq: Every day | CUTANEOUS | 0 refills | Status: AC

## 2019-12-30 NOTE — Progress Notes (Signed)
SURVIVORSHIP VISIT:    BRIEF ONCOLOGIC HISTORY:  Oncology History  Malignant neoplasm of lower-inner quadrant of left breast in female, estrogen receptor positive ([place])  03/13/2019 Initial Diagnosis   Screening detected left breast abnormalities, 0.6 cm at 8:30 position and 0.9 cm at 8 o'clock position with 1 abnormal left axillary lymph node.  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%, PR 90%, HER-2 -1+, Ki-67 20%; biopsy 8:30 position: ER 80%, PR 80%, Ki-67 5%, HER-2 negative; lymph node biopsy negative   03/31/2019 Cancer Staging   Staging form: Breast, AJCC 8th Edition - Clinical stage from 03/31/2019: Stage IA (cT1b, cN0, cM0, G2, ER+, PR+, HER2-)   05/14/2019 Surgery   Bilateral mastectomies Marlou Starks) [phone]):   Left mastectomy: Multifocal invasive lobular cancer largest focus 2.5 cm, grade 2, margins negative, 1/3 lymph node positive ER 80-95 %, PR 80-95 %, HER-2 negative, Ki-67 2-20%  Right mastectomy: Benign   05/21/2019 Cancer Staging   Staging form: Breast, AJCC 8th Edition - Pathologic stage from 05/21/2019: Stage IB (pT2, pN1a, cM0, G2, ER+, PR+, HER2-)   06/01/2019 Oncotype testing   Mammaprint: low risk   08/12/2019 - 09/25/2019 Radiation Therapy   The patient initially received a dose of 50.4 Gy in 28 fractions to the breast using whole-breast tangent fields. She also received 50.4 Gy in 28 fractions to the left supraclavicular region. This was delivered using a 3-D conformal technique. The pt received a boost delivering an additional 10 Gy in 5 fractions using a electron boost with 60mV electrons. The total dose was 60.4 Gy.   09/2019 - 09/2029 Anti-estrogen oral therapy   Tamoxifen     INTERVAL HISTORY:  Alice Davis review her survivorship care plan detailing her treatment course for breast cancer, as well as monitoring long-term side effects of that treatment, education regarding health maintenance, screening, and overall  wellness and health promotion.     Overall, Alice Davis reports feeling quite well.  She is taking Tamoxifen daily.  She says she is tolerating it well and has no new issues.  She completed her survivorship survey which indicates fatigue, about an 8/10.  She notes that recovering, going back to work, and all of her busyness has made her feel more tired.  She is going to see her PCP in a couple of months and plans on having labs drawn.  She has occ. Back tingling and bloating in her lower abdomen, but she says this is mild, very occasional, and not particularly bothersome. She is otherwise feeling well and is without any questions or concerns today.    REVIEW OF SYSTEMS:  Review of Systems  Constitutional: Positive for fatigue. Negative for appetite change, chills and unexpected weight change.  HENT:   Negative for hearing loss and lump/mass.   Eyes: Negative for eye problems and icterus.  Respiratory: Negative for chest tightness, cough and shortness of breath.   Cardiovascular: Negative for chest pain, leg swelling and palpitations.  Gastrointestinal: Negative for abdominal distention, abdominal pain, blood in stool, constipation, diarrhea, nausea and vomiting.  Endocrine: Negative for hot flashes.  Musculoskeletal: Negative for arthralgias.  Skin: Negative for itching and rash.  Neurological: Negative for dizziness, extremity weakness and headaches.  Hematological: Negative for adenopathy. Does not bruise/bleed easily.  Psychiatric/Behavioral: Negative.  Negative for depression. The patient is not nervous/anxious.    Breast: Denies any new nodularity, masses, tenderness, nipple changes, or nipple discharge.      ONCOLOGY TREATMENT TEAM:  1. Surgeon:  Dr. Marlou Starks at [facility] 2. Medical Oncologist: Dr. Lindi Adie  3. Radiation Oncologist: Dr. Isidore Moos    PAST MEDICAL/SURGICAL HISTORY:  Past Medical History:  Diagnosis Date  . Asthma   . Cancer [facility])    Past Surgical  History:  Procedure Laterality Date  . BREAST RECONSTRUCTION WITH PLACEMENT OF TISSUE EXPANDER AND FLEX HD (ACELLULAR HYDRATED DERMIS) Bilateral 05/14/2019   Procedure: IMMEDIATE BREAST RECONSTRUCTION WITH PLACEMENT OF TISSUE EXPANDER AND FLEX HD (ACELLULAR HYDRATED DERMIS);  Surgeon: Wallace Going, DO;  Location: [place];  Service: Plastics;  Laterality: Bilateral;  . NASAL SINUS SURGERY     20 years ago  . NIPPLE SPARING MASTECTOMY WITH SENTINEL LYMPH NODE BIOPSY Bilateral 05/14/2019   Procedure: BILATERAL NIPPLE SPARING MASTECTOMIES WITH LEFT SENTINEL LYMPH NODE BIOPSY;  Surgeon: [name], MD;  Location: [place];  Service: General;  Laterality: Bilateral;  PEC  . REMOVAL OF BILATERAL TISSUE EXPANDERS WITH PLACEMENT OF BILATERAL BREAST IMPLANTS Bilateral 07/13/2019   Procedure: REMOVAL OF BILATERAL TISSUE EXPANDERS WITH PLACEMENT OF BILATERAL BREAST IMPLANTS;  Surgeon: Wallace Going, DO;  Location: [place];  Service: Plastics;  Laterality: Bilateral;  . RHINOPLASTY     20 years  . WISDOM TOOTH EXTRACTION       ALLERGIES:  Allergies  Allergen Reactions  . Latex Itching and Rash  . Doxycycline      CURRENT MEDICATIONS:  Outpatient Encounter Medications as of 12/30/2019  Medication Sig  . cetirizine (ZYRTEC) 10 MG tablet Take 10 mg by mouth daily.   Marland Kitchen ELDERBERRY PO Take 15 mLs by mouth daily.  . Multiple Vitamins-Minerals (MULTIVITAMIN ADULT EXTRA C PO) Take 1 tablet by mouth daily.   . tamoxifen (NOLVADEX) 20 MG tablet Take 1 tablet (20 mg total) by mouth daily.   No facility-administered encounter medications on file as of 12/30/2019.     ONCOLOGIC FAMILY HISTORY:  Family History  Problem Relation Age of Onset  . Breast cancer Mother   . Diabetes Father   . Diabetes Paternal Grandmother      GENETIC COUNSELING/TESTING: Completed at Dr. Sherran Needs office, negative  SOCIAL HISTORY:  Social History   Socioeconomic History  . Marital status:  Married    Spouse name: Not on file  . Number of children: Not on file  . Years of education: Not on file  . Highest education level: Not on file  Occupational History  . Not on file  Tobacco Use  . Smoking status: Never Smoker  . Smokeless tobacco: Never Used  Vaping Use  . Vaping Use: Never used  Substance and Sexual Activity  . Alcohol use: Not Currently    Comment: Twice a month  . Drug use: Never  . Sexual activity: Yes  Other Topics Concern  . Not on file  Social History Narrative  . Not on file   Social Determinants of Health   Financial Resource Strain:   . Difficulty of Paying Living Expenses: Not on file  Food Insecurity:   . Worried About Charity fundraiser in the Last Year: Not on file  . Ran Out of Food in the Last Year: Not on file  Transportation Needs: No Transportation Needs  . Lack of Transportation (Medical): No  . Lack of Transportation (Non-Medical): No  Physical Activity:   . Days of Exercise per Week: Not on file  . Minutes of Exercise per Session: Not on file  Stress:   . Feeling of Stress :  Not on file  Social Connections:   . Frequency of Communication with Friends and Family: Not on file  . Frequency of Social Gatherings with Friends and Family: Not on file  . Attends Religious Services: Not on file  . Active Member of Clubs or Organizations: Not on file  . Attends Archivist Meetings: Not on file  . Marital Status: Not on file  Intimate Partner Violence: Not At Risk  . Fear of Current or Ex-Partner: No  . Emotionally Abused: No  . Physically Abused: No  . Sexually Abused: No     OBSERVATIONS/OBJECTIVE:  BP (!) 105/57 (BP Location: Left Arm, Patient Position: Sitting)   Pulse 83   Temp 98.5 F (36.9 C) (Tympanic)   Resp 18   Ht 5' 7.5" (1.715 m)   Wt 139 lb 1.6 oz (63.1 kg)   SpO2 97%   BMI 21.46 kg/m  GENERAL: Patient is a well appearing female in no acute distress HEENT:  Sclerae anicteric.  Oropharynx clear and  moist. No ulcerations or evidence of oropharyngeal candidiasis. Neck is supple.  NODES:  No cervical, supraclavicular, or axillary lymphadenopathy palpated.  BREAST EXAM:  Deferred. LUNGS:  Clear to auscultation bilaterally.  No wheezes or rhonchi. HEART:  Regular rate and rhythm. No murmur appreciated. ABDOMEN:  Soft, nontender.  Positive, normoactive bowel sounds. No organomegaly palpated. MSK:  No focal spinal tenderness to palpation. Full range of motion bilaterally in the upper extremities. EXTREMITIES:  No peripheral edema.   SKIN:  Clear with no obvious rashes or skin changes. No nail dyscrasia. NEURO:  Nonfocal. Well oriented.  Appropriate affect.    LABORATORY DATA:  None for this visit.  DIAGNOSTIC IMAGING:  None for this visit.      ASSESSMENT AND PLAN:  Ms.. Davis is a pleasant 49 y.o. female with Stage IA left breast invasive ductal carcinoma, ER+/PR+/HER2-, diagnosed in 03/2019, treated with bilateral mastectomies, adjuvant radiation therapy, and anti-estrogen therapy with Tamoxifen beginning in 09/2019.  She presents to the Survivorship Clinic for our initial meeting and routine follow-up post-completion of treatment for breast cancer.    1. Stage IA left breast cancer:  Alice Davis is continuing to recover from definitive treatment for breast cancer. She will follow-up with her medical oncologist, Dr. Lindi Adie  6 months with history and physical exam per surveillance protocol.  She will continue her anti-estrogen therapy with Tamoxifen. Thus far, she is tolerating the Tamoxifen well, with minimal side effects. She was instructed to make Dr. Lindi Adie or myself aware if she begins to experience any worsening side effects of the medication and I could see her back in clinic to help manage those side effects, as needed. Today, a comprehensive survivorship care plan and treatment summary was reviewed with the patient today detailing her breast cancer diagnosis, treatment course,  potential late/long-term effects of treatment, appropriate follow-up care with recommendations for the future, and patient education resources.  A copy of this summary, along with a letter will be sent to the patient's primary care provider via mail/fax/In Basket message after today's visit.    2.  Fatigue: Recommended f/u with her pcp with labs, as she is scheduled for lab testing in the next couple of months anyhow.    3. Bone health:  She was given education on specific activities to promote bone health.  4. Cancer screening:  Due to Alice Davis's history and her age, she should receive screening for skin cancers, colon cancer, and gynecologic cancers.  The  information and recommendations are listed on the patient's comprehensive care plan/treatment summary and were reviewed in detail with the patient.    5. Health maintenance and wellness promotion: Alice Davis was encouraged to consume 5-7 servings of fruits and vegetables per day. We reviewed the "Nutrition Rainbow" handout, as well as the handout "Take Control of Your Health and Reduce Your Cancer Risk" from the [place].  She was also encouraged to engage in moderate to vigorous exercise for 30 minutes per day most days of the week. We discussed the [employer] fitness program, which is designed for cancer survivors to help them become more physically fit after cancer treatments.  She was instructed to limit her alcohol consumption and continue to abstain from tobacco use.     6. Support services/counseling: It is not uncommon for this period of the patient's cancer care trajectory to be one of many emotions and stressors.  We discussed how this can be increasingly difficult during the times of quarantine and social distancing due to the COVID-19 pandemic.   She was given information regarding our available services and encouraged to contact me with any questions or for help enrolling in any of our support group/programs.     Follow up instructions:    -Return to cancer center in 6 months for f/u with Dr. Lindi Adie -She is welcome to return back to the Survivorship Clinic at any time; no additional follow-up needed at this time.  -Consider referral back to survivorship as a long-term survivor for continued surveillance  The patient was provided an opportunity to ask questions and all were answered. The patient agreed with the plan and demonstrated an understanding of the instructions.   Total encounter time: 30 minutes*  Wilber Bihari, NP 12/30/19 8:14 AM Medical Oncology and Hematology [facility] [place], [place] [address] Tel. [phone]    Fax. [phone]  *Total Encounter Time as defined by the Centers for Medicare and Medicaid Services includes, in addition to the face-to-face time of a patient visit (documented in the note above) non-face-to-face time: obtaining and reviewing outside history, ordering and reviewing medications, tests or procedures, care coordination (communications with other health care professionals or caregivers) and documentation in the medical record.

## 2019-12-30 NOTE — Progress Notes (Signed)
Met with patient briefly while she was getting checked in to see provider this afternoon.  VS obtained for the study visit per protocol during her routine VS collection.  Patient confirmed her study appointments this Friday 12/31/29 and plan to meet research nurse in lobby of [place] [facility] at 7:30 am. Patient verbalized understanding to fast for at least 3 hours prior to her lab appointment which is scheduled for 9 am. Thanked patient for her time and look forward to seeing her again on Friday. Foye Spurling, BSN, RN Clinical Research Nurse 12/30/2019 4:11 PM

## 2019-12-31 ENCOUNTER — Telehealth: Payer: Self-pay | Admitting: Hematology and Oncology

## 2019-12-31 ENCOUNTER — Encounter (INDEPENDENT_AMBULATORY_CARE_PROVIDER_SITE_OTHER): Payer: Self-pay

## 2019-12-31 NOTE — Telephone Encounter (Signed)
Scheduled appts per 8/25 los. Pt confirmed appt date and time.

## 2020-01-01 ENCOUNTER — Other Ambulatory Visit: Payer: Self-pay

## 2020-01-01 ENCOUNTER — Inpatient Hospital Stay (HOSPITAL_COMMUNITY): Admission: RE | Admit: 2020-01-01 | Payer: BC Managed Care – PPO | Source: Ambulatory Visit

## 2020-01-01 ENCOUNTER — Inpatient Hospital Stay: Payer: BC Managed Care – PPO

## 2020-01-01 ENCOUNTER — Inpatient Hospital Stay: Payer: BC Managed Care – PPO | Admitting: *Deleted

## 2020-01-01 ENCOUNTER — Ambulatory Visit (HOSPITAL_COMMUNITY)
Admission: RE | Admit: 2020-01-01 | Discharge: 2020-01-01 | Disposition: A | Discharge status: Home / Self Care | Payer: Self-pay | Source: Ambulatory Visit | Attending: Hematology and Oncology | Admitting: Hematology and Oncology

## 2020-01-01 ENCOUNTER — Encounter: Payer: Self-pay | Admitting: *Deleted

## 2020-01-01 DIAGNOSIS — Z006 Encounter for examination for normal comparison and control in clinical research program: Secondary | ICD-10-CM

## 2020-01-01 DIAGNOSIS — Z170 Estrogen receptor positive status [ER+]: Secondary | ICD-10-CM

## 2020-01-01 DIAGNOSIS — C50312 Malignant neoplasm of lower-inner quadrant of left female breast: Secondary | ICD-10-CM

## 2020-01-01 LAB — RESEARCH LABS

## 2020-01-01 NOTE — Research (Signed)
[id number] UPBEAT STUDY 3 Months Assessments:  Patient in clinic by herself this morning to complete the activities for the Upbeat study.  PROs; Given to patient to complete in clinic. Research nurse collected and checked for completion and accuracy at the end of the visit.  [id number] Questionnaire; Patient answered questions in clinic.  Cardiac MRI; Completed without difficulty. Lab; Research specimens collected per protocol.  Patient confirmed she had been fasting for at least 3 hours prior to blood collection.  VS; Collected during routine vital signs done in clinic on 12/30/19. See VS flowsheet. Concomitant Meds; Current medication list reviewed with patient and medication list is up to date.  Patient states she started taking Tamoxifen on 10/06/19 as directed by Dr. Lindi Adie.   Waist Measurement;  35 inches. Physical Functions Testing; Completed without difficulty.  Neurocognitive Testing; Administered by Farris Has, research assistant.  Cardiovascular Events; Patient denies any hospitalizations or cardiovascular events since last visit.   Gift Card; [phone] [employer] gift card and study tote bag given to patient for completing the 3 months activities on this study.  Plan; 12 months visit is due 10/01/19 +/- 60 days. Informed patient research nurse will call her to schedule in March 2022.  She verbalized understanding. Thanked patient for her participation in this study.  Foye Spurling, BSN, RN Clinical Research Nurse 01/01/2020

## 2020-01-04 ENCOUNTER — Telehealth: Payer: Self-pay | Admitting: *Deleted

## 2020-01-04 NOTE — Telephone Encounter (Signed)
Upbeat lab results; Called patient with study lab results. Informed her of slightly elevated LDL level and informed her that research will mail 2 copies to her home address for her records. Informed patient that we do not have any report from cardiac MRI yet but will notify her of any abnormal findings. Patient verbalized understanding.  Foye Spurling, BSN, RN Clinical Research Nurse 01/04/2020 4:12 PM

## 2020-01-05 ENCOUNTER — Telehealth: Payer: Self-pay

## 2020-01-05 DIAGNOSIS — Z170 Estrogen receptor positive status [ER+]: Secondary | ICD-10-CM

## 2020-01-05 NOTE — Telephone Encounter (Signed)
UPBEAT [id number] - UNDERSTANDING and PREDICTING BREAST CANCER EVENTS AFTER TREATMENT  01/05/2020 16:05PM   OUTGOING CALL: There are unanswered questions noted on the Covid-19 questionnaire from Noora's recent 61-month UPBEAT visit, so an outgoing call was made to obtain responses to unanswered questions. I verified that I was speaking with Marvelene and introduced myself as one of the clinical research nurses. Darah answered the remaining questions without difficulty and responses are reflected on the form; we spoke for approximately five minutes. Tamora is thanked for her time and continued participation in the UPBEAT study. Provided responses will be entered into the [employer] system as required. Clinical Research Nurse Foye Spurling will be notified of the call today.  Dionne Bucy. Sharlett Iles, BSN, RN, CIC 01/05/2020 4:19 PM

## 2020-01-14 ENCOUNTER — Encounter (INDEPENDENT_AMBULATORY_CARE_PROVIDER_SITE_OTHER): Payer: Self-pay

## 2020-01-19 ENCOUNTER — Encounter: Payer: Self-pay | Admitting: Rehabilitation

## 2020-01-19 ENCOUNTER — Other Ambulatory Visit: Payer: Self-pay

## 2020-01-19 ENCOUNTER — Ambulatory Visit: Payer: BC Managed Care – PPO | Attending: Radiation Oncology | Admitting: Rehabilitation

## 2020-01-19 DIAGNOSIS — M25611 Stiffness of right shoulder, not elsewhere classified: Secondary | ICD-10-CM | POA: Insufficient documentation

## 2020-01-19 DIAGNOSIS — M25612 Stiffness of left shoulder, not elsewhere classified: Secondary | ICD-10-CM | POA: Insufficient documentation

## 2020-01-19 DIAGNOSIS — L599 Disorder of the skin and subcutaneous tissue related to radiation, unspecified: Secondary | ICD-10-CM | POA: Insufficient documentation

## 2020-01-19 DIAGNOSIS — Z9013 Acquired absence of bilateral breasts and nipples: Secondary | ICD-10-CM | POA: Insufficient documentation

## 2020-01-19 DIAGNOSIS — C50312 Malignant neoplasm of lower-inner quadrant of left female breast: Secondary | ICD-10-CM | POA: Insufficient documentation

## 2020-01-19 DIAGNOSIS — Z170 Estrogen receptor positive status [ER+]: Secondary | ICD-10-CM | POA: Diagnosis present

## 2020-01-19 DIAGNOSIS — R293 Abnormal posture: Secondary | ICD-10-CM | POA: Diagnosis present

## 2020-01-19 NOTE — Therapy (Signed)
[place], [place], [address] Phone: [phone]   Fax:  [phone]  Physical Therapy Treatment  Patient Details  Name: Alice Davis MRN: [id number] Date of Birth: February 05, 1971 Referring Provider (PT): Reita May Date: 01/19/2020   PT End of Session - 01/19/20 1704    Visit Number 8    Number of Visits 20    Date for PT Re-Evaluation 03/01/20    PT Start Time 1603    PT Stop Time 1700    PT Time Calculation (min) 57 min    Activity Tolerance Patient tolerated treatment well    Behavior During Therapy [facility] for tasks assessed/performed           Past Medical History:  Diagnosis Date   Asthma    Cancer ([place])     Past Surgical History:  Procedure Laterality Date   BREAST RECONSTRUCTION WITH PLACEMENT OF TISSUE EXPANDER AND FLEX HD (ACELLULAR HYDRATED DERMIS) Bilateral 05/14/2019   Procedure: IMMEDIATE BREAST RECONSTRUCTION WITH PLACEMENT OF TISSUE EXPANDER AND FLEX HD (ACELLULAR HYDRATED DERMIS);  Surgeon: Wallace Going, DO;  Location: [place];  Service: Plastics;  Laterality: Bilateral;   NASAL SINUS SURGERY     20 years ago   NIPPLE SPARING MASTECTOMY WITH SENTINEL LYMPH NODE BIOPSY Bilateral 05/14/2019   Procedure: BILATERAL NIPPLE SPARING MASTECTOMIES WITH LEFT SENTINEL LYMPH NODE BIOPSY;  Surgeon: [name], MD;  Location: [place];  Service: General;  Laterality: Bilateral;  PEC   REMOVAL OF BILATERAL TISSUE EXPANDERS WITH PLACEMENT OF BILATERAL BREAST IMPLANTS Bilateral 07/13/2019   Procedure: REMOVAL OF BILATERAL TISSUE EXPANDERS WITH PLACEMENT OF BILATERAL BREAST IMPLANTS;  Surgeon: Wallace Going, DO;  Location: [place];  Service: Plastics;  Laterality: Bilateral;   RHINOPLASTY     20 years   WISDOM TOOTH EXTRACTION      There were no vitals filed for this visit.   Subjective Assessment - 01/19/20 1604    Subjective I am just still tight and  not all the way there.  It is in there and deep.    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 No/denies              [facility] PT Assessment - 01/19/20 0001      AROM   Left Shoulder Flexion 148 Degrees    Left Shoulder ABduction 149 Degrees    Left Shoulder Internal Rotation 45 Degrees    Left Shoulder External Rotation 41 Degrees                         [facility] Adult PT Treatment/Exercise - 01/19/20 0001      Exercises   Exercises Shoulder      Shoulder Exercises: Supine   Other Supine Exercises foam roll series on half roll; alternating flexion x 5, protraction/retraction x 5 bil, reaching for feet bil x 5, shoulder hug to mat press x 5 unable to reach mat with the left side       Shoulder Exercises: Stretch   Other Shoulder Stretches  seated lat stretch on table x 30"     Other Shoulder Stretches open book stretch 10" x 3 left      Manual Therapy   Soft tissue mobilization soft tissue work to soften pec major and tight tissue in axilla and then latissimus tightness evident with release of pectoralis     Myofascial Release to the Lt axilla and towards incision     Passive ROM P/ROM into flexion, abduction, and ER with intermittent STM and myofascial release.                        PT Long Term Goals - 01/19/20 1710      PT LONG TERM GOAL #1   Title Pt will demonstrate 160 degrees of left shoulder flexion to allow pt to reach overhead    Baseline 152, 168 on 11/24/2019, 148 on 01/19/20 back against wall    Time 6    Period Weeks    Status  On-going      PT LONG TERM GOAL #2   Title Pt will demonstratse 160 degrees of left shoulder abduction to allow pt to reach out to the sides    Baseline 147, 175 on 11/24/2019, 149 on 01/19/20    Time 6    Period Weeks    Status New      PT LONG TERM GOAL #3   Title Pt will report a 75% improvement in tightness and discomfort in left lateral trunk with end range shoulder ROM for improved comfort.    Status Achieved      PT LONG TERM GOAL #4   Title Pt will be independent in a home exercise program for continued strengthening and stretching    Status On-going                 Plan - 01/19/20 1705    Clinical Impression Statement Pt continues with left pectoralis, axillary, and latissimus tightness on the left with decreased motion from last assessment into all directions.  Will resume POC to increase Lt shoulder mobility    Stability/Clinical Decision Making Stable/Uncomplicated    Rehab Potential Good    PT Frequency 2x / week    PT Duration 6 weeks    PT Treatment/Interventions ADLs/Self Care Home Management;Manual techniques;Patient/family education;Therapeutic exercise;Passive range of motion;Manual lymph drainage;Vasopneumatic Device;Scar mobilization    PT Next Visit Plan thoracic foam roll series, try extensions, try wall rotation, and then STM/release to Lt pectoralis and lat/axilla.  Pt would like aggressive work    Oncologist with Plan of Care Patient           Patient will benefit from skilled therapeutic intervention in order to improve the following deficits and impairments:  Decreased range of motion, Postural dysfunction, Decreased scar mobility, Impaired UE functional use, Increased fascial restricitons, Decreased strength  Visit Diagnosis: Stiffness of left shoulder, not elsewhere classified  Disorder of the skin and subcutaneous tissue related to radiation, unspecified  Abnormal posture  Acquired absence of both breasts  Malignant neoplasm of  lower-inner quadrant of left breast in female, estrogen receptor positive (HCC)  Stiffness of right shoulder, not elsewhere classified     Problem List Patient Active Problem List   Diagnosis Date Noted   S/P breast reconstruction, bilateral 07/21/2019   Acquired absence of breast 05/22/2019   Breast cancer ([place]) 05/14/2019   Malignant neoplasm of lower-inner quadrant of left breast in female, estrogen receptor positive ([place]) 03/24/2019  Allergic dermatitis 12/27/2017   Reactive airway disease without complication 12/82/0813   Allergic rhinitis 11/28/2016   Hyperlipidemia 11/28/2016    [name] 01/19/2020, 5:12 PM  [place], [place], [address] Phone: [phone]   Fax:  [phone]  Name: Quita Mcgrory MRN: [id number] Date of Birth: 1971/04/04

## 2020-01-21 ENCOUNTER — Encounter: Payer: Self-pay | Admitting: Rehabilitation

## 2020-01-21 ENCOUNTER — Encounter (INDEPENDENT_AMBULATORY_CARE_PROVIDER_SITE_OTHER): Payer: Self-pay

## 2020-01-21 ENCOUNTER — Other Ambulatory Visit: Payer: Self-pay

## 2020-01-21 ENCOUNTER — Ambulatory Visit: Payer: BC Managed Care – PPO | Admitting: Rehabilitation

## 2020-01-21 DIAGNOSIS — Z170 Estrogen receptor positive status [ER+]: Secondary | ICD-10-CM

## 2020-01-21 DIAGNOSIS — M25611 Stiffness of right shoulder, not elsewhere classified: Secondary | ICD-10-CM

## 2020-01-21 DIAGNOSIS — Z9013 Acquired absence of bilateral breasts and nipples: Secondary | ICD-10-CM

## 2020-01-21 DIAGNOSIS — M25612 Stiffness of left shoulder, not elsewhere classified: Secondary | ICD-10-CM

## 2020-01-21 DIAGNOSIS — L599 Disorder of the skin and subcutaneous tissue related to radiation, unspecified: Secondary | ICD-10-CM

## 2020-01-21 DIAGNOSIS — R293 Abnormal posture: Secondary | ICD-10-CM

## 2020-01-21 NOTE — Therapy (Signed)
[place], [place], [address] Phone: [phone]   Fax:  [phone]  Physical Therapy Treatment  Patient Details  Name: Alice Davis MRN: [id number] Date of Birth: 08-21-1970 Referring Provider (PT): Reita May Date: 01/21/2020   PT End of Session - 01/21/20 1706    Visit Number 9    Number of Visits 20    Date for PT Re-Evaluation 03/01/20    PT Start Time 1603    PT Stop Time 1656    PT Time Calculation (min) 53 min    Activity Tolerance Patient tolerated treatment well    Behavior During Therapy [facility] for tasks assessed/performed           Past Medical History:  Diagnosis Date  . Asthma   . Cancer [facility])     Past Surgical History:  Procedure Laterality Date  . BREAST RECONSTRUCTION WITH PLACEMENT OF TISSUE EXPANDER AND FLEX HD (ACELLULAR HYDRATED DERMIS) Bilateral 05/14/2019   Procedure: IMMEDIATE BREAST RECONSTRUCTION WITH PLACEMENT OF TISSUE EXPANDER AND FLEX HD (ACELLULAR HYDRATED DERMIS);  Surgeon: Wallace Going, DO;  Location: [place];  Service: Plastics;  Laterality: Bilateral;  . NASAL SINUS SURGERY     20 years ago  . NIPPLE SPARING MASTECTOMY WITH SENTINEL LYMPH NODE BIOPSY Bilateral 05/14/2019   Procedure: BILATERAL NIPPLE SPARING MASTECTOMIES WITH LEFT SENTINEL LYMPH NODE BIOPSY;  Surgeon: [name], MD;  Location: [place];  Service: General;  Laterality: Bilateral;  PEC  . REMOVAL OF BILATERAL TISSUE EXPANDERS WITH PLACEMENT OF BILATERAL BREAST IMPLANTS Bilateral 07/13/2019   Procedure: REMOVAL OF BILATERAL TISSUE EXPANDERS WITH PLACEMENT OF BILATERAL BREAST IMPLANTS;  Surgeon: Wallace Going, DO;  Location: [place];  Service: Plastics;  Laterality: Bilateral;  . RHINOPLASTY     20 years  . WISDOM TOOTH EXTRACTION      There were no vitals filed for this visit.   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 No/denies                  L-DEX FLOWSHEETS - 01/21/20 1600      L-DEX LYMPHEDEMA SCREENING   Measurement Type Unilateral    L-DEX MEASUREMENT EXTREMITY Upper Extremity    POSITION  Standing    DOMINANT SIDE Right    At Risk Side Left    BASELINE SCORE (UNILATERAL) 1.2    L-DEX SCORE (UNILATERAL) 2.4    VALUE CHANGE (UNILAT) 1.2                     [facility] Adult PT Treatment/Exercise - 01/21/20 0001      Shoulder Exercises: Supine   Other Supine Exercises on half roll; alternating flexion x 5, T and Y stretch with towel and bolster propping as needed to make stretch appropriate    Other Supine Exercises foam roll thoracic extension multiple levels       Shoulder Exercises: Pulleys   Flexion 2 minutes    Flexion  Limitations for warmup      Shoulder Exercises: Therapy Ball   Flexion 10 reps;Both    Flexion Limitations with prolonged lean at the top and pressure at the pinky edge for more latissimus       Manual Therapy   Soft tissue mobilization soft tissue work to soften pec major and tight tissue in axilla and then latissimus tightness evident with release of pectoralis with work in sidelying to the latissimus and blcking overhead abduction x 5    Myofascial Release to the Lt axilla and towards incision     Passive ROM P/ROM into flexion, abduction, and ER with intermittent STM and myofascial release.                        PT Long Term Goals - 01/19/20 1710      PT LONG TERM GOAL #1   Title Pt  will demonstrate 160 degrees of left shoulder flexion to allow pt to reach overhead    Baseline 152, 168 on 11/24/2019, 148 on 01/19/20 back against wall    Time 6    Period Weeks    Status On-going      PT LONG TERM GOAL #2   Title Pt will demonstratse 160 degrees of left shoulder abduction to allow pt to reach out to the sides    Baseline 147, 175 on 11/24/2019, 149 on 01/19/20    Time 6    Period Weeks    Status New      PT LONG TERM GOAL #3   Title Pt will report a 75% improvement in tightness and discomfort in left lateral trunk with end range shoulder ROM for improved comfort.    Status Achieved      PT LONG TERM GOAL #4   Title Pt will be independent in a home exercise program for continued strengthening and stretching    Status On-going                 Plan - 01/21/20 1707    Clinical Impression Statement Improvements noted in pectoralis already to palpation with less tautness here.  Pt toelrated treatment well and was advised to focus on prolong hold stretches to elongate the muscles as much as possible since they keep tightening up each night. Performed SOZO in clinic so patient would not have to return for screen day separately.  SOZO increased by 1.2 from last scan.    PT Frequency 2x / week    PT Duration 6 weeks    PT Treatment/Interventions ADLs/Self Care Home Management;Manual techniques;Patient/family education;Therapeutic exercise;Passive range of motion;Manual lymph drainage;Vasopneumatic Device;Scar mobilization    PT Next Visit Plan thoracic foam roll series, try extensions, try wall rotation, and then STM/release to Lt pectoralis and lat/axilla.  Pt would like aggressive work           Patient will benefit from skilled therapeutic intervention in order to improve the following deficits and impairments:     Visit Diagnosis: Disorder of the skin and subcutaneous tissue related to radiation, unspecified  Stiffness of left shoulder, not elsewhere  classified  Abnormal posture  Acquired absence of both breasts  Malignant neoplasm of lower-inner quadrant of left breast in female, estrogen receptor positive (HCC)  Stiffness of right shoulder, not elsewhere classified     Problem List Patient Active Problem List   Diagnosis Date Noted  . S/P breast reconstruction, bilateral 07/21/2019  . Acquired absence of breast 05/22/2019  . Breast  cancer ([place]) 05/14/2019  . Malignant neoplasm of lower-inner quadrant of left breast in female, estrogen receptor positive ([place]) 03/24/2019  . Allergic dermatitis 12/27/2017  . Reactive airway disease without complication 03/05/1313  . Allergic rhinitis 11/28/2016  . Hyperlipidemia 11/28/2016    [name] 01/21/2020, 5:10 PM  [place], [place], [address] Phone: [phone]   Fax:  [phone]  Name: Kaziyah Parkison MRN: [id number] Date of Birth: 1971-03-19

## 2020-01-26 ENCOUNTER — Other Ambulatory Visit: Payer: Self-pay

## 2020-01-26 ENCOUNTER — Ambulatory Visit: Payer: BC Managed Care – PPO | Admitting: Rehabilitation

## 2020-01-26 ENCOUNTER — Encounter: Payer: Self-pay | Admitting: Rehabilitation

## 2020-01-26 DIAGNOSIS — R293 Abnormal posture: Secondary | ICD-10-CM

## 2020-01-26 DIAGNOSIS — M25612 Stiffness of left shoulder, not elsewhere classified: Secondary | ICD-10-CM

## 2020-01-26 DIAGNOSIS — M25611 Stiffness of right shoulder, not elsewhere classified: Secondary | ICD-10-CM

## 2020-01-26 DIAGNOSIS — Z9013 Acquired absence of bilateral breasts and nipples: Secondary | ICD-10-CM

## 2020-01-26 DIAGNOSIS — Z170 Estrogen receptor positive status [ER+]: Secondary | ICD-10-CM

## 2020-01-26 DIAGNOSIS — L599 Disorder of the skin and subcutaneous tissue related to radiation, unspecified: Secondary | ICD-10-CM

## 2020-01-26 NOTE — Therapy (Signed)
[place], [place], [address] Phone: [phone]   Fax:  [phone]  Physical Therapy Treatment  Patient Details  Name: Alice Davis MRN: [id number] Date of Birth: June 02, 1970 Referring Provider (PT): Reita May Date: 01/26/2020   PT End of Session - 01/26/20 1711    Visit Number 10    Number of Visits 20    Date for PT Re-Evaluation 03/01/20    PT Start Time 5638    PT Stop Time 1705    PT Time Calculation (min) 58 min    Activity Tolerance Patient tolerated treatment well    Behavior During Therapy [facility] for tasks assessed/performed           Past Medical History:  Diagnosis Date  . Asthma   . Cancer [facility])     Past Surgical History:  Procedure Laterality Date  . BREAST RECONSTRUCTION WITH PLACEMENT OF TISSUE EXPANDER AND FLEX HD (ACELLULAR HYDRATED DERMIS) Bilateral 05/14/2019   Procedure: IMMEDIATE BREAST RECONSTRUCTION WITH PLACEMENT OF TISSUE EXPANDER AND FLEX HD (ACELLULAR HYDRATED DERMIS);  Surgeon: Wallace Going, DO;  Location: [place];  Service: Plastics;  Laterality: Bilateral;  . NASAL SINUS SURGERY     20 years ago  . NIPPLE SPARING MASTECTOMY WITH SENTINEL LYMPH NODE BIOPSY Bilateral 05/14/2019   Procedure: BILATERAL NIPPLE SPARING MASTECTOMIES WITH LEFT SENTINEL LYMPH NODE BIOPSY;  Surgeon: [name], MD;  Location: [place];  Service: General;  Laterality: Bilateral;  PEC  . REMOVAL OF BILATERAL TISSUE EXPANDERS WITH PLACEMENT OF BILATERAL BREAST IMPLANTS Bilateral 07/13/2019   Procedure: REMOVAL OF BILATERAL TISSUE EXPANDERS WITH PLACEMENT OF BILATERAL BREAST IMPLANTS;  Surgeon: Wallace Going, DO;  Location: [place];  Service: Plastics;  Laterality: Bilateral;  . RHINOPLASTY     20 years  . WISDOM TOOTH EXTRACTION      There were no vitals filed for this visit.   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 No/denies              [facility] PT Assessment - 01/26/20 0001      AROM   Left Shoulder Flexion 158 Degrees    Left Shoulder ABduction 155 Degrees    Left Shoulder External Rotation 60 Degrees                         [facility] Adult PT Treatment/Exercise - 01/26/20 0001      Shoulder Exercises: Supine   Other Supine Exercises on half roll pectorlalis stretch x 30", then; alternating flexion x 10 and bil x 10, T motion alternating x 10 and together x 10,     Other Supine Exercises no roll; ER alternating ER at neutral then ER slides overhead x 5, then ER at 90deg and ER slides down x 5, diamond overhead x 8 for pectoralis stretch      Manual Therapy   Soft tissue mobilization soft tissue work to [employer] major and tight tissue  in axilla and then latissimus tightness evident with release of pectoralis with work in sidelying to the latissimus and blcking overhead abduction x 5    Myofascial Release to the Lt axilla and towards incision     Passive ROM P/ROM into flexion, abduction, and ER with intermittent STM and myofascial release.                        PT Long Term Goals - 01/19/20 1710      PT LONG TERM GOAL #1   Title Pt will demonstrate 160 degrees of left shoulder flexion to allow pt to reach overhead    Baseline 152, 168 on 11/24/2019, 148 on 01/19/20 back against wall    Time 6    Period Weeks    Status On-going      PT LONG TERM GOAL #2   Title Pt will demonstratse 160 degrees of left shoulder abduction to allow  pt to reach out to the sides    Baseline 147, 175 on 11/24/2019, 149 on 01/19/20    Time 6    Period Weeks    Status New      PT LONG TERM GOAL #3   Title Pt will report a 75% improvement in tightness and discomfort in left lateral trunk with end range shoulder ROM for improved comfort.    Status Achieved      PT LONG TERM GOAL #4   Title Pt will be independent in a home exercise program for continued strengthening and stretching    Status On-going                 Plan - 01/26/20 1711    Clinical Impression Statement Pt with much improved AROM today and much improved tone and decreased tightness to the left pectoralis even with end range mobilitry.  Pt will purchase foam roll for HEP and will be unable to return for awhile due to 4pm appointments full.    PT Frequency 2x / week    PT Duration 6 weeks    PT Treatment/Interventions ADLs/Self Care Home Management;Manual techniques;Patient/family education;Therapeutic exercise;Passive range of motion;Manual lymph drainage;Vasopneumatic Device;Scar mobilization    PT Next Visit Plan teach thoracic foam roll series for HEP, then STM/release to Lt pectoralis and lat/axilla.  Pt would like aggressive work    Oncologist with Plan of Care Patient           Patient will benefit from skilled therapeutic intervention in order to improve the following deficits and impairments:     Visit Diagnosis: Disorder of the skin and subcutaneous tissue related to radiation, unspecified  Stiffness of left shoulder, not elsewhere classified  Abnormal posture  Malignant neoplasm of lower-inner quadrant of left breast in female, estrogen receptor positive (HCC)  Stiffness of right shoulder, not elsewhere classified  Acquired absence of both breasts     Problem List Patient Active Problem List   Diagnosis Date Noted  . S/P breast reconstruction, bilateral 07/21/2019  . Acquired absence of breast 05/22/2019  . Breast cancer ([place])  05/14/2019  . Malignant neoplasm of lower-inner quadrant of left breast in female, estrogen receptor positive ([place]) 03/24/2019  . Allergic dermatitis 12/27/2017  . Reactive airway disease without complication 00/86/7619  . Allergic rhinitis 11/28/2016  . Hyperlipidemia 11/28/2016    [name] 01/26/2020, 5:14 PM  [place], [place], [address] Phone: [phone]   Fax:  [phone]  Name: Lalanya Rufener MRN: [id number] Date of Birth: [birth year]

## 2020-01-28 ENCOUNTER — Encounter (INDEPENDENT_AMBULATORY_CARE_PROVIDER_SITE_OTHER): Payer: Self-pay

## 2020-01-28 ENCOUNTER — Ambulatory Visit: Payer: BC Managed Care – PPO | Admitting: Rehabilitation

## 2020-01-28 ENCOUNTER — Other Ambulatory Visit: Payer: Self-pay

## 2020-01-28 DIAGNOSIS — C50312 Malignant neoplasm of lower-inner quadrant of left female breast: Secondary | ICD-10-CM

## 2020-01-28 DIAGNOSIS — M25612 Stiffness of left shoulder, not elsewhere classified: Secondary | ICD-10-CM

## 2020-01-28 DIAGNOSIS — Z170 Estrogen receptor positive status [ER+]: Secondary | ICD-10-CM

## 2020-01-28 DIAGNOSIS — L599 Disorder of the skin and subcutaneous tissue related to radiation, unspecified: Secondary | ICD-10-CM

## 2020-01-28 DIAGNOSIS — R293 Abnormal posture: Secondary | ICD-10-CM

## 2020-01-28 NOTE — Therapy (Signed)
[place], [place], [address] Phone: [phone]   Fax:  [phone]  Physical Therapy Treatment  Patient Details  Name: Alice Davis MRN: [id number] Date of Birth: 1970/12/26 Referring Provider (PT): Reita May Date: 01/28/2020   PT End of Session - 01/28/20 1728    Visit Number 11    Number of Visits 20    Date for PT Re-Evaluation 03/01/20    PT Start Time 1600    PT Stop Time 1649    PT Time Calculation (min) 49 min    Activity Tolerance Patient tolerated treatment well    Behavior During Therapy [facility] for tasks assessed/performed           Past Medical History:  Diagnosis Date  . Asthma   . Cancer [facility])     Past Surgical History:  Procedure Laterality Date  . BREAST RECONSTRUCTION WITH PLACEMENT OF TISSUE EXPANDER AND FLEX HD (ACELLULAR HYDRATED DERMIS) Bilateral 05/14/2019   Procedure: IMMEDIATE BREAST RECONSTRUCTION WITH PLACEMENT OF TISSUE EXPANDER AND FLEX HD (ACELLULAR HYDRATED DERMIS);  Surgeon: Wallace Going, DO;  Location: [place];  Service: Plastics;  Laterality: Bilateral;  . NASAL SINUS SURGERY     20 years ago  . NIPPLE SPARING MASTECTOMY WITH SENTINEL LYMPH NODE BIOPSY Bilateral 05/14/2019   Procedure: BILATERAL NIPPLE SPARING MASTECTOMIES WITH LEFT SENTINEL LYMPH NODE BIOPSY;  Surgeon: [name], MD;  Location: [place];  Service: General;  Laterality: Bilateral;  PEC  . REMOVAL OF BILATERAL TISSUE EXPANDERS WITH PLACEMENT OF BILATERAL BREAST IMPLANTS Bilateral 07/13/2019   Procedure: REMOVAL OF BILATERAL TISSUE EXPANDERS WITH PLACEMENT OF BILATERAL BREAST IMPLANTS;  Surgeon: Wallace Going, DO;  Location: [place];  Service: Plastics;  Laterality: Bilateral;  . RHINOPLASTY     20 years  . WISDOM TOOTH EXTRACTION      There were no vitals filed for this visit.   Subjective Assessment - 01/28/20 1727    Subjective I have felt good lately.   I got the foam roll    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, grade 2, margins negative, 1/3 lymph node positive ER 80-95 %, PR 80-95 %, HER-2 negative, Ki-67 2-20%right mastectomy: Benign; then bil implant exchange July 13, 2019, pt completed radiation 09/25/19, pt currently tamoxifen    Patient Stated Goals to get better ROM and decrease tightness    Currently in Pain? No/denies                             [facility] Adult PT Treatment/Exercise - 01/28/20 0001      Exercises   Other Exercises  emailed pt medbridge foam roll program      Manual Therapy   Soft tissue mobilization soft tissue work to soften pec major and tight tissue in axilla and then latissimus tightness evident with release of pectoralis with work in sidelying to the latissimus and blcking overhead abduction x 5    Myofascial Release to the Lt axilla and towards incision     Passive ROM P/ROM into flexion, abduction, and ER with intermittent STM and myofascial release.                        PT Long Term Goals - 01/19/20 1710      PT LONG TERM GOAL #1  Title Pt will demonstrate 160 degrees of left shoulder flexion to allow pt to reach overhead    Baseline 152, 168 on 11/24/2019, 148 on 01/19/20 back against wall    Time 6    Period Weeks    Status On-going      PT LONG TERM GOAL #2   Title Pt will demonstratse 160 degrees of left shoulder abduction to allow pt to reach out to the sides    Baseline 147, 175 on 11/24/2019, 149 on 01/19/20    Time 6    Period Weeks    Status New      PT LONG TERM GOAL #3   Title Pt will report a 75% improvement in tightness and discomfort in left lateral trunk with end range shoulder ROM for improved comfort.    Status Achieved      PT LONG TERM GOAL #4   Title Pt will be independent in a home exercise program for continued strengthening and stretching     Status On-going                 Plan - 01/28/20 1729    Clinical Impression Statement Pt continues with improved ROM, less visible tightness at the pectoralis and WNL PROM to the left shoulder but still with pull felt in the latissimus around to the scapula but no longer in the pectoralis.  Foam roll program was emailed to pt to work on until visit in a few weeks to see if pt can maintain at home.    PT Frequency 2x / week    PT Duration 6 weeks    PT Treatment/Interventions ADLs/Self Care Home Management;Manual techniques;Patient/family education;Therapeutic exercise;Passive range of motion;Manual lymph drainage;Vasopneumatic Device;Scar mobilization    PT Next Visit Plan measure AROM/goals, then STM/release to Lt pectoralis and lat/axilla.  Pt would like aggressive work    PT [place] [phone] for foam roll    Consulted and Agree with Plan of Care Patient           Patient will benefit from skilled therapeutic intervention in order to improve the following deficits and impairments:     Visit Diagnosis: Disorder of the skin and subcutaneous tissue related to radiation, unspecified  Stiffness of left shoulder, not elsewhere classified  Abnormal posture  Malignant neoplasm of lower-inner quadrant of left breast in female, estrogen receptor positive ([place])     Problem List Patient Active Problem List   Diagnosis Date Noted  . S/P breast reconstruction, bilateral 07/21/2019  . Acquired absence of breast 05/22/2019  . Breast cancer ([place]) 05/14/2019  . Malignant neoplasm of lower-inner quadrant of left breast in female, estrogen receptor positive ([place]) 03/24/2019  . Allergic dermatitis 12/27/2017  . Reactive airway disease without complication 54/01/8118  . Allergic rhinitis 11/28/2016  . Hyperlipidemia 11/28/2016    [name] 01/28/2020, 5:31 PM  [place], [place],  [address] Phone: [phone]   Fax:  [phone]  Name: Alice Davis MRN: [id number] Date of Birth: 1970-11-01

## 2020-01-28 NOTE — Patient Instructions (Signed)
Access Code: [email or web address]: [email or web address]: 09/23/2021Prepared by: Mahlon Gammon Notes  The first 5 are what we have been doing in clinic and the last 3 are extra things that would be beneficial using the roller.  Remember to raise the distance with pillows or towels or just don't use the foam roller if the stretch is too aggressive.  Exercises  Thoracic Foam Roll Mobilization Backstroke - 3 x weekly - 1 sets - 10 reps  Supine Static Chest Stretch on Foam Roll - 3 x weekly - 1 sets - 10 reps  Thoracic Y on Foam Roll - 3 x weekly - 1 sets - 10 reps  Open Book Chest Rotation Stretch on Foam 1/2 Roll - 3 x weekly - 1 sets - 10 reps  Hooklying Scapular Protraction on Foam Roll - 3 x weekly - 1 sets - 10 reps  Thoracic Extension with Foam Roll - 3 x weekly - 10-20 second hold  Latissimus Mobilization on Foam Roll - 3 x weekly  Thoracic Extension Mobilization on Foam Roll - 3 x weekly - 3-5 reps - 5-10 second hold

## 2020-02-01 ENCOUNTER — Ambulatory Visit: Payer: BC Managed Care – PPO

## 2020-02-02 ENCOUNTER — Telehealth: Payer: Self-pay | Admitting: *Deleted

## 2020-02-02 NOTE — Telephone Encounter (Signed)
Pt called with c/o lump to left breast at mastectomy scar.  Notified Drs Marlou Starks and [place] pt and informed will receive a call from Dr. Ethlyn Gallery office for an appt. Confirmed appt with Dr. Marla Roe on 10/12 at 8:30am. Informed pt she can cancel if it is not needed. Received verbal understanding. No further needs voiced.

## 2020-02-04 ENCOUNTER — Encounter (INDEPENDENT_AMBULATORY_CARE_PROVIDER_SITE_OTHER): Payer: Self-pay

## 2020-02-11 ENCOUNTER — Encounter (INDEPENDENT_AMBULATORY_CARE_PROVIDER_SITE_OTHER): Payer: Self-pay

## 2020-02-16 ENCOUNTER — Ambulatory Visit (INDEPENDENT_AMBULATORY_CARE_PROVIDER_SITE_OTHER): Payer: BC Managed Care – PPO | Admitting: Surgical

## 2020-02-16 ENCOUNTER — Encounter: Payer: Self-pay | Admitting: Surgical

## 2020-02-16 ENCOUNTER — Other Ambulatory Visit: Payer: Self-pay

## 2020-02-16 VITALS — BP 104/68 | HR 83 | Temp 98.4°F

## 2020-02-16 DIAGNOSIS — Z9889 Other specified postprocedural states: Secondary | ICD-10-CM | POA: Diagnosis not present

## 2020-02-16 NOTE — Progress Notes (Signed)
   Subjective:     Patient ID: Alice Davis, female    DOB: 12-Nov-1970, 49 y.o.   MRN: [id number]  Chief Complaint  Patient presents with  . Follow-up    HPI: The patient is a 49 y.o. female here for follow-up on her bilateral breast reconstruction.  She is here for evaluation of a new lump that she noticed.  She has a history of left breast radiation.  Patient reports she noticed a small lump along the incision of her left breast, she spoke with her oncologist about this and was recommended to follow-up with [place] and general surgery.  Patient saw Dr. Marlou Starks yesterday and he ordered an ultrasound to further evaluate.  Patient also requested to see Dr. Marla Roe during today's appointment to discuss as well.  She is otherwise doing well, is very happy with her reconstruction and contour  Review of Systems  Constitutional: Negative.   Skin: Negative.      Objective:   Vital Signs BP 104/68 (BP Location: Right Arm, Patient Position: Sitting, Cuff Size: Normal)   Pulse 83   Temp 98.4 F (36.9 C) (Temporal)   LMP 01/05/2020 (Approximate)   SpO2 100%  Vital Signs and Nursing Note Reviewed Chaperone present Physical Exam Constitutional:      General: She is not in acute distress.    Appearance: Normal appearance. She is not ill-appearing.  HENT:     Head: Normocephalic and atraumatic.  Pulmonary:     Effort: Pulmonary effort is normal.  Chest:     Breasts:        Right: No swelling, mass, nipple discharge or skin change.        Left: Mass present. No swelling, nipple discharge or skin change.     Comments: Nipple sparing mastectomy incisions noted, incisions intact, bilateral breasts are symmetric, soft, no evidence of capsular contracture with palpation.  Left breast slightly more firm, likely due to radiation. Small BB size lump noted with palpation of the left breast inframammary fold incision.  No overlying skin changes.  No nipple discharge noted.  No erythema  noted Neurological:     Mental Status: She is alert.  Psychiatric:        Mood and Affect: Mood normal.        Behavior: Behavior normal.     Assessment/Plan:     ICD-10-CM   1. S/P breast reconstruction, bilateral  [id number]     Patient is scheduled for ultrasound which was ordered by general surgery, agree with this Potentially some scarring or the capsule.  Await ultrasound results.  We will schedule an additional follow-up via telemetry visit or in person depending on patient preference once ultrasound results are available  Recommend calling with questions or concerns.  Tentative follow-up with Dr. Marla Roe on 03/11/2020   Carola Rhine Zamaya Rapaport, PA-C 02/16/2020, 9:23 AM

## 2020-02-18 ENCOUNTER — Other Ambulatory Visit: Payer: Self-pay

## 2020-02-18 ENCOUNTER — Encounter: Payer: Self-pay | Admitting: Rehabilitation

## 2020-02-18 ENCOUNTER — Ambulatory Visit: Payer: BC Managed Care – PPO | Attending: Radiation Oncology | Admitting: Rehabilitation

## 2020-02-18 ENCOUNTER — Encounter (INDEPENDENT_AMBULATORY_CARE_PROVIDER_SITE_OTHER): Payer: Self-pay

## 2020-02-18 DIAGNOSIS — L599 Disorder of the skin and subcutaneous tissue related to radiation, unspecified: Secondary | ICD-10-CM | POA: Insufficient documentation

## 2020-02-18 DIAGNOSIS — M25612 Stiffness of left shoulder, not elsewhere classified: Secondary | ICD-10-CM | POA: Diagnosis present

## 2020-02-18 DIAGNOSIS — M25611 Stiffness of right shoulder, not elsewhere classified: Secondary | ICD-10-CM | POA: Diagnosis present

## 2020-02-18 DIAGNOSIS — Z9013 Acquired absence of bilateral breasts and nipples: Secondary | ICD-10-CM | POA: Diagnosis present

## 2020-02-18 DIAGNOSIS — Z170 Estrogen receptor positive status [ER+]: Secondary | ICD-10-CM | POA: Diagnosis present

## 2020-02-18 DIAGNOSIS — R293 Abnormal posture: Secondary | ICD-10-CM | POA: Insufficient documentation

## 2020-02-18 DIAGNOSIS — C50312 Malignant neoplasm of lower-inner quadrant of left female breast: Secondary | ICD-10-CM | POA: Insufficient documentation

## 2020-02-18 NOTE — Therapy (Signed)
[place], [place], [address] Phone: [phone]   Fax:  [phone]  Physical Therapy Treatment  Patient Details  Name: Alice Davis MRN: [id number] Date of Birth: [birth year] Referring Provider (PT): Reita May Date: 02/18/2020   PT End of Session - 02/18/20 1717    Visit Number 12    Number of Visits 20    Date for PT Re-Evaluation 03/01/20    PT Start Time 1604    PT Stop Time 1658    PT Time Calculation (min) 54 min    Activity Tolerance Patient tolerated treatment well    Behavior During Therapy [facility] for tasks assessed/performed           Past Medical History:  Diagnosis Date  . Asthma   . Cancer [facility])     Past Surgical History:  Procedure Laterality Date  . BREAST RECONSTRUCTION WITH PLACEMENT OF TISSUE EXPANDER AND FLEX HD (ACELLULAR HYDRATED DERMIS) Bilateral 05/14/2019   Procedure: IMMEDIATE BREAST RECONSTRUCTION WITH PLACEMENT OF TISSUE EXPANDER AND FLEX HD (ACELLULAR HYDRATED DERMIS);  Surgeon: Wallace Going, DO;  Location: [place];  Service: Plastics;  Laterality: Bilateral;  . NASAL SINUS SURGERY     20 years ago  . NIPPLE SPARING MASTECTOMY WITH SENTINEL LYMPH NODE BIOPSY Bilateral 05/14/2019   Procedure: BILATERAL NIPPLE SPARING MASTECTOMIES WITH LEFT SENTINEL LYMPH NODE BIOPSY;  Surgeon: [name], MD;  Location: [place];  Service: General;  Laterality: Bilateral;  PEC  . REMOVAL OF BILATERAL TISSUE EXPANDERS WITH PLACEMENT OF BILATERAL BREAST IMPLANTS Bilateral 07/13/2019   Procedure: REMOVAL OF BILATERAL TISSUE EXPANDERS WITH PLACEMENT OF BILATERAL BREAST IMPLANTS;  Surgeon: Wallace Going, DO;  Location: [place];  Service: Plastics;  Laterality: Bilateral;  . RHINOPLASTY     20 years  . WISDOM TOOTH EXTRACTION      There were no vitals filed for this visit.   Subjective Assessment - 02/18/20 1557    Subjective I have maybe stretched  3-4 times at home so I haven't done much.  I also found a lump under the breast they are going to examine with [place] soon.  I feel really tight in the morning    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, grade 2, margins negative, 1/3 lymph node positive ER 80-95 %, PR 80-95 %, HER-2 negative, Ki-67 2-20%right mastectomy: Benign; then bil implant exchange July 13, 2019, pt completed radiation 09/25/19, pt currently tamoxifen    Currently in Pain? No/denies              [facility] PT Assessment - 02/18/20 0001      AROM   Left Shoulder Flexion 152 Degrees (P)     Left Shoulder ABduction 145 Degrees (P)     Left Shoulder External Rotation 50 Degrees (P)                          [facility] Adult PT Treatment/Exercise - 02/18/20 0001      Manual Therapy   Soft tissue mobilization soft tissue work to soften pec major and tight tissue in axilla and then latissimus tightness evident with release of pectoralis with work in sidelying to the latissimus and blcking overhead abduction x 5    Myofascial Release to the Lt axilla and towards incision     Passive ROM P/ROM into flexion, abduction,  and ER with intermittent STM and myofascial release.                        PT Long Term Goals - 01/19/20 1710      PT LONG TERM GOAL #1   Title Pt will demonstrate 160 degrees of left shoulder flexion to allow pt to reach overhead    Baseline 152, 168 on 11/24/2019, 148 on 01/19/20 back against wall    Time 6    Period Weeks    Status On-going      PT LONG TERM GOAL #2   Title Pt will demonstratse 160 degrees of left shoulder abduction to allow pt to reach out to the sides    Baseline 147, 175 on 11/24/2019, 149 on 01/19/20    Time 6    Period Weeks    Status New      PT LONG TERM GOAL #3   Title Pt will report a 75% improvement in tightness and discomfort in left lateral trunk with  end range shoulder ROM for improved comfort.    Status Achieved      PT LONG TERM GOAL #4   Title Pt will be independent in a home exercise program for continued strengthening and stretching    Status On-going                 Plan - 02/18/20 1717    Clinical Impression Statement Pt arrives 3 weeks after last PT session with some decrease in AROM but no increase in palpable muscle tightness.  Pt overall has not stretched much since last visit so it was emphasized to do shower stretches when starting her day and foam roll 2-3x per week to maintain gains.    PT Frequency 1x / week    PT Duration 6 weeks    PT Treatment/Interventions ADLs/Self Care Home Management;Manual techniques;Patient/family education;Therapeutic exercise;Passive range of motion;Manual lymph drainage;Vasopneumatic Device;Scar mobilization    PT Next Visit Plan STM/release to Lt pectoralis and lat/axilla.  Pt would like aggressive work    PT [place] [phone] for foam roll    Consulted and Agree with Plan of Care Patient           Patient will benefit from skilled therapeutic intervention in order to improve the following deficits and impairments:     Visit Diagnosis: Disorder of the skin and subcutaneous tissue related to radiation, unspecified  Stiffness of left shoulder, not elsewhere classified  Abnormal posture  Malignant neoplasm of lower-inner quadrant of left breast in female, estrogen receptor positive (HCC)  Stiffness of right shoulder, not elsewhere classified  Acquired absence of both breasts     Problem List Patient Active Problem List   Diagnosis Date Noted  . S/P breast reconstruction, bilateral 07/21/2019  . Acquired absence of breast 05/22/2019  . Breast cancer ([place]) 05/14/2019  . Malignant neoplasm of lower-inner quadrant of left breast in female, estrogen receptor positive ([place]) 03/24/2019  . Allergic dermatitis 12/27/2017  . Reactive airway disease without  complication 37/90/2409  . Allergic rhinitis 11/28/2016  . Hyperlipidemia 11/28/2016    [name] 02/18/2020, 5:20 PM  [place], [place], [address] Phone: [phone]   Fax:  [phone]  Name: Aundra Pung MRN: [id number] Date of Birth: 1971-03-18

## 2020-02-19 ENCOUNTER — Other Ambulatory Visit: Payer: Self-pay | Admitting: General Surgery

## 2020-02-19 DIAGNOSIS — N6320 Unspecified lump in the left breast, unspecified quadrant: Secondary | ICD-10-CM

## 2020-02-23 ENCOUNTER — Ambulatory Visit
Admission: RE | Admit: 2020-02-23 | Discharge: 2020-02-23 | Disposition: A | Discharge status: Home / Self Care | Payer: BC Managed Care – PPO | Source: Ambulatory Visit | Attending: General Surgery | Admitting: General Surgery

## 2020-02-23 ENCOUNTER — Other Ambulatory Visit: Payer: Self-pay

## 2020-02-23 ENCOUNTER — Other Ambulatory Visit: Payer: Self-pay | Admitting: General Surgery

## 2020-02-23 DIAGNOSIS — N6320 Unspecified lump in the left breast, unspecified quadrant: Secondary | ICD-10-CM

## 2020-02-25 ENCOUNTER — Encounter (INDEPENDENT_AMBULATORY_CARE_PROVIDER_SITE_OTHER): Payer: Self-pay

## 2020-03-01 ENCOUNTER — Ambulatory Visit: Payer: BC Managed Care – PPO | Admitting: Rehabilitation

## 2020-03-01 ENCOUNTER — Other Ambulatory Visit: Payer: Self-pay

## 2020-03-01 ENCOUNTER — Encounter: Payer: Self-pay | Admitting: Rehabilitation

## 2020-03-01 DIAGNOSIS — Z9013 Acquired absence of bilateral breasts and nipples: Secondary | ICD-10-CM

## 2020-03-01 DIAGNOSIS — M25611 Stiffness of right shoulder, not elsewhere classified: Secondary | ICD-10-CM

## 2020-03-01 DIAGNOSIS — L599 Disorder of the skin and subcutaneous tissue related to radiation, unspecified: Secondary | ICD-10-CM

## 2020-03-01 DIAGNOSIS — Z170 Estrogen receptor positive status [ER+]: Secondary | ICD-10-CM

## 2020-03-01 DIAGNOSIS — R293 Abnormal posture: Secondary | ICD-10-CM

## 2020-03-01 DIAGNOSIS — C50312 Malignant neoplasm of lower-inner quadrant of left female breast: Secondary | ICD-10-CM

## 2020-03-01 DIAGNOSIS — M25612 Stiffness of left shoulder, not elsewhere classified: Secondary | ICD-10-CM

## 2020-03-01 NOTE — Therapy (Signed)
[place], [place], [address] Phone: [phone]   Fax:  [phone]  Physical Therapy Treatment  Patient Details  Name: Alice Davis MRN: [id number] Date of Birth: [birth year] Referring Provider (PT): Reita May Date: 03/01/2020   PT End of Session - 03/01/20 1723    Visit Number 13    Number of Visits 20    Date for PT Re-Evaluation 04/05/20    Authorization - Number of Visits 60    PT Start Time 1603    PT Stop Time 1656    PT Time Calculation (min) 53 min    Activity Tolerance Patient tolerated treatment well    Behavior During Therapy [facility] for tasks assessed/performed           Past Medical History:  Diagnosis Date  . Asthma   . Cancer [facility])     Past Surgical History:  Procedure Laterality Date  . BREAST RECONSTRUCTION WITH PLACEMENT OF TISSUE EXPANDER AND FLEX HD (ACELLULAR HYDRATED DERMIS) Bilateral 05/14/2019   Procedure: IMMEDIATE BREAST RECONSTRUCTION WITH PLACEMENT OF TISSUE EXPANDER AND FLEX HD (ACELLULAR HYDRATED DERMIS);  Surgeon: Wallace Going, DO;  Location: [place];  Service: Plastics;  Laterality: Bilateral;  . NASAL SINUS SURGERY     20 years ago  . NIPPLE SPARING MASTECTOMY WITH SENTINEL LYMPH NODE BIOPSY Bilateral 05/14/2019   Procedure: BILATERAL NIPPLE SPARING MASTECTOMIES WITH LEFT SENTINEL LYMPH NODE BIOPSY;  Surgeon: [name], MD;  Location: [place];  Service: General;  Laterality: Bilateral;  PEC  . REMOVAL OF BILATERAL TISSUE EXPANDERS WITH PLACEMENT OF BILATERAL BREAST IMPLANTS Bilateral 07/13/2019   Procedure: REMOVAL OF BILATERAL TISSUE EXPANDERS WITH PLACEMENT OF BILATERAL BREAST IMPLANTS;  Surgeon: Wallace Going, DO;  Location: [place];  Service: Plastics;  Laterality: Bilateral;  . RHINOPLASTY     20 years  . WISDOM TOOTH EXTRACTION      There were no vitals filed for this visit.   Subjective Assessment - 03/01/20 1605     Subjective I am going to get a biopsy on that spot 03/07/20.  I am still tight    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, grade 2, margins negative, 1/3 lymph node positive ER 80-95 %, PR 80-95 %, HER-2 negative, Ki-67 2-20%right mastectomy: Benign; then bil implant exchange July 13, 2019, pt completed radiation 09/25/19, pt currently tamoxifen    Patient Stated Goals to get better ROM and decrease tightness    Currently in Pain? No/denies                             [facility] Adult PT Treatment/Exercise - 03/01/20 0001      Manual Therapy   Soft tissue mobilization soft tissue work to soften pec major and tight tissue in axilla     Myofascial Release to the Lt axilla and towards incision.  Able to address deepers axillary tightness / cord lateral to the pectoralis border apparent more today with less muscle tension    Passive ROM P/ROM into flexion, abduction, and ER with intermittent STM and myofascial release.                        PT Long Term Goals - 01/19/20 1710      PT LONG TERM GOAL #1   Title Pt  will demonstrate 160 degrees of left shoulder flexion to allow pt to reach overhead    Baseline 152, 168 on 11/24/2019, 148 on 01/19/20 back against wall    Time 6    Period Weeks    Status On-going      PT LONG TERM GOAL #2   Title Pt will demonstratse 160 degrees of left shoulder abduction to allow pt to reach out to the sides    Baseline 147, 175 on 11/24/2019, 149 on 01/19/20    Time 6    Period Weeks    Status New      PT LONG TERM GOAL #3   Title Pt will report a 75% improvement in tightness and discomfort in left lateral trunk with end range shoulder ROM for improved comfort.    Status Achieved      PT LONG TERM GOAL #4   Title Pt will be independent in a home exercise program for continued strengthening and stretching    Status On-going                   Plan - 03/01/20 1724    Clinical Impression Statement Pt continues with radiation related soft tissue tightness in the left chest and residual cording in the axilla which was more palpable today with less muscle tension.  Pt overall is doing well with morning stiffness and less stiffness as the day progresses but still improved with STM/MT.    PT Frequency 1x / week    PT Duration --   5 weeks   PT Treatment/Interventions ADLs/Self Care Home Management;Manual techniques;Patient/family education;Therapeutic exercise;Passive range of motion;Manual lymph drainage;Vasopneumatic Device;Scar mobilization    PT Next Visit Plan how was biopsy? okay to work on it? any more visits? STM/release to Lt pectoralis and lat/axilla.  Pt would like aggressive work           Patient will benefit from skilled therapeutic intervention in order to improve the following deficits and impairments:     Visit Diagnosis: Disorder of the skin and subcutaneous tissue related to radiation, unspecified  Stiffness of left shoulder, not elsewhere classified  Abnormal posture  Malignant neoplasm of lower-inner quadrant of left breast in female, estrogen receptor positive (HCC)  Stiffness of right shoulder, not elsewhere classified  Acquired absence of both breasts     Problem List Patient Active Problem List   Diagnosis Date Noted  . S/P breast reconstruction, bilateral 07/21/2019  . Acquired absence of breast 05/22/2019  . Breast cancer ([place]) 05/14/2019  . Malignant neoplasm of lower-inner quadrant of left breast in female, estrogen receptor positive ([place]) 03/24/2019  . Allergic dermatitis 12/27/2017  . Reactive airway disease without complication 02/77/4128  . Allergic rhinitis 11/28/2016  . Hyperlipidemia 11/28/2016    [name] 03/01/2020, 5:26 PM  [place], [place], [address] Phone: [phone]    Fax:  [phone]  Name: Alice Davis MRN: [id number] Date of Birth: Nov 25, 1970

## 2020-03-03 ENCOUNTER — Encounter (INDEPENDENT_AMBULATORY_CARE_PROVIDER_SITE_OTHER): Payer: Self-pay

## 2020-03-07 ENCOUNTER — Other Ambulatory Visit: Payer: Self-pay | Admitting: General Surgery

## 2020-03-07 ENCOUNTER — Ambulatory Visit
Admission: RE | Admit: 2020-03-07 | Discharge: 2020-03-07 | Disposition: A | Discharge status: Home / Self Care | Payer: BC Managed Care – PPO | Source: Ambulatory Visit | Attending: General Surgery | Admitting: General Surgery

## 2020-03-07 ENCOUNTER — Other Ambulatory Visit: Payer: Self-pay

## 2020-03-07 DIAGNOSIS — N6320 Unspecified lump in the left breast, unspecified quadrant: Secondary | ICD-10-CM

## 2020-03-09 ENCOUNTER — Other Ambulatory Visit: Payer: Self-pay | Admitting: General Surgery

## 2020-03-09 DIAGNOSIS — Z853 Personal history of malignant neoplasm of breast: Secondary | ICD-10-CM

## 2020-03-10 ENCOUNTER — Encounter (INDEPENDENT_AMBULATORY_CARE_PROVIDER_SITE_OTHER): Payer: Self-pay

## 2020-03-11 ENCOUNTER — Other Ambulatory Visit: Payer: Self-pay

## 2020-03-11 ENCOUNTER — Telehealth: Payer: BC Managed Care – PPO | Admitting: Plastic Surgery

## 2020-03-11 ENCOUNTER — Encounter: Payer: Self-pay | Admitting: Plastic Surgery

## 2020-03-11 ENCOUNTER — Telehealth (INDEPENDENT_AMBULATORY_CARE_PROVIDER_SITE_OTHER): Payer: BC Managed Care – PPO | Admitting: Plastic Surgery

## 2020-03-11 ENCOUNTER — Ambulatory Visit
Admission: RE | Admit: 2020-03-11 | Discharge: 2020-03-11 | Disposition: A | Discharge status: Home / Self Care | Payer: BC Managed Care – PPO | Source: Ambulatory Visit | Attending: General Surgery | Admitting: General Surgery

## 2020-03-11 DIAGNOSIS — Z853 Personal history of malignant neoplasm of breast: Secondary | ICD-10-CM

## 2020-03-11 DIAGNOSIS — Z9889 Other specified postprocedural states: Secondary | ICD-10-CM

## 2020-03-11 DIAGNOSIS — Z9013 Acquired absence of bilateral breasts and nipples: Secondary | ICD-10-CM

## 2020-03-11 NOTE — Progress Notes (Signed)
I connected with  Alice Davis on 03/11/20 by a video enabled telemedicine application and verified that I am speaking with the correct person using two identifiers.   I discussed the limitations of evaluation and management by telemedicine. The patient expressed understanding and agreed to proceed. The patient was at home and I was at the office.  We spent 5 minutes in discussion.  The patient's U/S and bx were negative for cancer.  Patient is releived.  I would like to see her back in 6 months, sooner if needed.  She is in agreement.

## 2020-03-15 ENCOUNTER — Encounter: Payer: Self-pay | Admitting: Rehabilitation

## 2020-03-17 ENCOUNTER — Encounter (INDEPENDENT_AMBULATORY_CARE_PROVIDER_SITE_OTHER): Payer: Self-pay

## 2020-03-24 ENCOUNTER — Encounter (INDEPENDENT_AMBULATORY_CARE_PROVIDER_SITE_OTHER): Payer: Self-pay

## 2020-04-12 ENCOUNTER — Encounter: Payer: Self-pay | Admitting: Rehabilitation

## 2020-04-12 ENCOUNTER — Other Ambulatory Visit: Payer: Self-pay

## 2020-04-12 ENCOUNTER — Ambulatory Visit: Payer: BC Managed Care – PPO | Attending: Radiation Oncology | Admitting: Rehabilitation

## 2020-04-12 DIAGNOSIS — M25612 Stiffness of left shoulder, not elsewhere classified: Secondary | ICD-10-CM

## 2020-04-12 DIAGNOSIS — L599 Disorder of the skin and subcutaneous tissue related to radiation, unspecified: Secondary | ICD-10-CM

## 2020-04-12 DIAGNOSIS — M25611 Stiffness of right shoulder, not elsewhere classified: Secondary | ICD-10-CM | POA: Diagnosis present

## 2020-04-12 DIAGNOSIS — C50312 Malignant neoplasm of lower-inner quadrant of left female breast: Secondary | ICD-10-CM | POA: Diagnosis present

## 2020-04-12 DIAGNOSIS — Z9013 Acquired absence of bilateral breasts and nipples: Secondary | ICD-10-CM | POA: Insufficient documentation

## 2020-04-12 DIAGNOSIS — Z170 Estrogen receptor positive status [ER+]: Secondary | ICD-10-CM | POA: Insufficient documentation

## 2020-04-12 DIAGNOSIS — R293 Abnormal posture: Secondary | ICD-10-CM | POA: Diagnosis present

## 2020-04-12 NOTE — Therapy (Signed)
[place], [place], [address] Phone: [phone]   Fax:  [phone]  Physical Therapy Treatment  Patient Details  Name: Alice Davis MRN: [id number] Date of Birth: 1971-02-26 Referring Provider (PT): Reita May Date: 04/12/2020   PT End of Session - 04/12/20 8502    Visit Number 14    Number of Visits 20    Date for PT Re-Evaluation 06/07/20    Authorization - Visit Number 14    Authorization - Number of Visits 60    PT Start Time 7741    PT Stop Time 1700    PT Time Calculation (min) 57 min    Activity Tolerance Patient tolerated treatment well    Behavior During Therapy [facility] for tasks assessed/performed           Past Medical History:  Diagnosis Date  . Asthma   . Cancer [facility])     Past Surgical History:  Procedure Laterality Date  . BREAST RECONSTRUCTION WITH PLACEMENT OF TISSUE EXPANDER AND FLEX HD (ACELLULAR HYDRATED DERMIS) Bilateral 05/14/2019   Procedure: IMMEDIATE BREAST RECONSTRUCTION WITH PLACEMENT OF TISSUE EXPANDER AND FLEX HD (ACELLULAR HYDRATED DERMIS);  Surgeon: Wallace Going, DO;  Location: [place];  Service: Plastics;  Laterality: Bilateral;  . NASAL SINUS SURGERY     20 years ago  . NIPPLE SPARING MASTECTOMY WITH SENTINEL LYMPH NODE BIOPSY Bilateral 05/14/2019   Procedure: BILATERAL NIPPLE SPARING MASTECTOMIES WITH LEFT SENTINEL LYMPH NODE BIOPSY;  Surgeon: [name], MD;  Location: [place];  Service: General;  Laterality: Bilateral;  PEC  . REMOVAL OF BILATERAL TISSUE EXPANDERS WITH PLACEMENT OF BILATERAL BREAST IMPLANTS Bilateral 07/13/2019   Procedure: REMOVAL OF BILATERAL TISSUE EXPANDERS WITH PLACEMENT OF BILATERAL BREAST IMPLANTS;  Surgeon: Wallace Going, DO;  Location: [place];  Service: Plastics;  Laterality: Bilateral;  . RHINOPLASTY     20 years  . WISDOM TOOTH EXTRACTION      There were no vitals filed for this visit.    Subjective Assessment - 04/12/20 1604    Subjective I had to get the biopsy and now I have some more cording in the arm.    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 No/denies                             [facility] Adult PT Treatment/Exercise - 04/12/20 0001      Exercises   Other Exercises  reminded pt to focus on horizontal abduction with wrist extension, single arm wall stretch, and mermaid type lateral flexion stretch       Manual Therapy   Soft tissue mobilization to the forearm, upper arm, and pectoralis with cording release as tolerated.  Audible pop x 2    Myofascial Release Cross hands along left trunk, in axilla, and along the left arm    Passive ROM of the left shoulder into flexion, abduction, and D2 at various angles with incorporation of pinning at the axilla and various locations along cording which was from lateral  implant to axilla and down the lateral arm towards the wrist                       PT Long Term Goals - 04/12/20 1719      PT LONG TERM GOAL #1   Title Pt will demonstrate 160 degrees of left shoulder flexion to allow pt to reach overhead    Baseline 152, 168 on 11/24/2019, 148 on 01/19/20 back against wall    Status On-going      PT LONG TERM GOAL #2   Title Pt will demonstratse 160 degrees of left shoulder abduction to allow pt to reach out to the sides    Baseline 147, 175 on 11/24/2019, 149 on 01/19/20    Status On-going      PT LONG TERM GOAL #3   Title Pt will report a 75% improvement in tightness and discomfort in left  lateral trunk with end range shoulder ROM for improved comfort.    Status Achieved      PT LONG TERM GOAL #4   Title Pt will be independent in a home exercise program for continued strengthening and stretching    Status On-going      PT LONG TERM GOAL #5   Title Pt will report no pull in the left UE with AROM due to cording    Time 8    Period Weeks    Status New                 Plan - 04/12/20 1716    Clinical Impression Statement Pt returns after recent biopsy with return of cording in the Lt UE.  Biopsy results were normal.  Cording extends from left lateral breast to axilla, and then down the left upper mid to outer arm towards the elbow and on to the radial side of the wrist.  2 audible pops heard with STM/PROM today with with improved PROM post treatment.  Pt will restart PT visits at this time.    PT Frequency 1x / week    PT Duration 8 weeks    PT Treatment/Interventions ADLs/Self Care Home Management;Manual techniques;Patient/family education;Therapeutic exercise;Passive range of motion;Manual lymph drainage;Vasopneumatic Device;Scar mobilization    PT Next Visit Plan measure AROM, Cording release Lt UE from breast to wrist, PROM,   (Pt would like aggressive work)    PT [place] for foam roll    Consulted and Agree with Plan of Care Patient           Patient will benefit from skilled therapeutic intervention in order to improve the following deficits and impairments:  Decreased range of motion, Postural dysfunction, Decreased scar mobility, Impaired UE functional use, Increased fascial restricitons, Decreased strength  Visit Diagnosis: Disorder of the skin and subcutaneous tissue related to radiation, unspecified  Stiffness of left shoulder, not elsewhere classified  Abnormal posture  Malignant neoplasm of lower-inner quadrant of left breast in female, estrogen receptor positive (HCC)  Stiffness of right shoulder, not elsewhere  classified  Acquired absence of both breasts     Problem List Patient Active Problem List   Diagnosis Date Noted  . S/P breast reconstruction, bilateral 07/21/2019  . Acquired absence of breast 05/22/2019  . Breast cancer ([place]) 05/14/2019  . Malignant neoplasm of lower-inner quadrant of left breast in female, estrogen receptor positive ([place]) 03/24/2019  . Allergic dermatitis 12/27/2017  . Reactive airway disease without complication 95/39/6728  . Allergic rhinitis 11/28/2016  .  Hyperlipidemia 11/28/2016    [name] 04/12/2020, 5:21 PM  [place], [place], [address] Phone: [phone]   Fax:  [phone]  Name: Alice Davis MRN: [id number] Date of Birth: 1971-02-07

## 2020-04-14 ENCOUNTER — Encounter (INDEPENDENT_AMBULATORY_CARE_PROVIDER_SITE_OTHER): Payer: Self-pay

## 2020-04-21 ENCOUNTER — Ambulatory Visit: Payer: BC Managed Care – PPO | Admitting: Rehabilitation

## 2020-04-21 ENCOUNTER — Other Ambulatory Visit: Payer: Self-pay

## 2020-04-21 ENCOUNTER — Encounter (INDEPENDENT_AMBULATORY_CARE_PROVIDER_SITE_OTHER): Payer: Self-pay

## 2020-04-21 ENCOUNTER — Encounter: Payer: Self-pay | Admitting: Rehabilitation

## 2020-04-21 DIAGNOSIS — R293 Abnormal posture: Secondary | ICD-10-CM

## 2020-04-21 DIAGNOSIS — Z170 Estrogen receptor positive status [ER+]: Secondary | ICD-10-CM

## 2020-04-21 DIAGNOSIS — L599 Disorder of the skin and subcutaneous tissue related to radiation, unspecified: Secondary | ICD-10-CM | POA: Diagnosis not present

## 2020-04-21 DIAGNOSIS — M25611 Stiffness of right shoulder, not elsewhere classified: Secondary | ICD-10-CM

## 2020-04-21 DIAGNOSIS — Z9013 Acquired absence of bilateral breasts and nipples: Secondary | ICD-10-CM

## 2020-04-21 DIAGNOSIS — M25612 Stiffness of left shoulder, not elsewhere classified: Secondary | ICD-10-CM

## 2020-04-21 NOTE — Therapy (Signed)
[place], [place], [address] Phone: [phone]   Fax:  [phone]  Physical Therapy Treatment  Patient Details  Name: Alice Davis MRN: [id number] Date of Birth: February 24, 1971 Referring Provider (PT): Reita May Date: 04/21/2020   PT End of Session - 04/21/20 1712    Visit Number 15    Number of Visits 20    Date for PT Re-Evaluation 06/07/20    Authorization - Visit Number 15    Authorization - Number of Visits 60    PT Start Time 6314    PT Stop Time 1650    PT Time Calculation (min) 49 min    Activity Tolerance Patient tolerated treatment well    Behavior During Therapy [facility] for tasks assessed/performed           Past Medical History:  Diagnosis Date  . Asthma   . Cancer [facility])     Past Surgical History:  Procedure Laterality Date  . BREAST RECONSTRUCTION WITH PLACEMENT OF TISSUE EXPANDER AND FLEX HD (ACELLULAR HYDRATED DERMIS) Bilateral 05/14/2019   Procedure: IMMEDIATE BREAST RECONSTRUCTION WITH PLACEMENT OF TISSUE EXPANDER AND FLEX HD (ACELLULAR HYDRATED DERMIS);  Surgeon: Wallace Going, DO;  Location: [place];  Service: Plastics;  Laterality: Bilateral;  . NASAL SINUS SURGERY     20 years ago  . NIPPLE SPARING MASTECTOMY WITH SENTINEL LYMPH NODE BIOPSY Bilateral 05/14/2019   Procedure: BILATERAL NIPPLE SPARING MASTECTOMIES WITH LEFT SENTINEL LYMPH NODE BIOPSY;  Surgeon: [name], MD;  Location: [place];  Service: General;  Laterality: Bilateral;  PEC  . REMOVAL OF BILATERAL TISSUE EXPANDERS WITH PLACEMENT OF BILATERAL BREAST IMPLANTS Bilateral 07/13/2019   Procedure: REMOVAL OF BILATERAL TISSUE EXPANDERS WITH PLACEMENT OF BILATERAL BREAST IMPLANTS;  Surgeon: Wallace Going, DO;  Location: [place];  Service: Plastics;  Laterality: Bilateral;  . RHINOPLASTY     20 years  . WISDOM TOOTH EXTRACTION      There were no vitals filed for this visit.    Subjective Assessment - 04/21/20 1601    Subjective Its still there.    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, grade 2, margins negative, 1/3 lymph node positive ER 80-95 %, PR 80-95 %, HER-2 negative, Ki-67 2-20%right mastectomy: Benign; then bil implant exchange July 13, 2019, pt completed radiation 09/25/19, pt currently tamoxifen                             [facility] Adult PT Treatment/Exercise - 04/21/20 0001      Manual Therapy   Soft tissue mobilization to the forearm, upper arm, and pectoralis    Myofascial Release Cross hands along left trunk, in axilla, and along the left arm    Passive ROM of the left shoulder into flexion, abduction, and D2 at various angles with incorporation of pinning at the axilla and various locations along cording which was from lateral implant to axilla and down the lateral arm towards the wrist but today is only evident in the upper arm.                       PT Long Term Goals - 04/12/20 1719      PT LONG TERM GOAL #1   Title Pt will demonstrate 160 degrees of left shoulder flexion to allow pt to  reach overhead    Baseline 152, 168 on 11/24/2019, 148 on 01/19/20 back against wall    Status On-going      PT LONG TERM GOAL #2   Title Pt will demonstratse 160 degrees of left shoulder abduction to allow pt to reach out to the sides    Baseline 147, 175 on 11/24/2019, 149 on 01/19/20    Status On-going      PT LONG TERM GOAL #3   Title Pt will report a 75% improvement in tightness and discomfort in left lateral trunk with end range shoulder ROM for improved comfort.    Status Achieved      PT LONG TERM GOAL #4   Title Pt will be independent in a home exercise program for continued strengthening and stretching    Status On-going      PT LONG TERM GOAL #5   Title Pt will report no pull in the left UE with AROM due to cording     Time 8    Period Weeks    Status New                 Plan - 04/21/20 1713    Clinical Impression Statement Pt returns with improvements in cording in the Lt UE with less pulling today experienced with AROM and daily activities and pull felt more proximally with PROM.  Some puckering still visible in the UE with D2 flexion.  Pt will benefit from more PT sessions at this time to decrease cording restriction and tightness in the anterior chest.    Stability/Clinical Decision Making Stable/Uncomplicated    PT Frequency 1x / week    PT Duration 8 weeks    PT Treatment/Interventions ADLs/Self Care Home Management;Manual techniques;Patient/family education;Therapeutic exercise;Passive range of motion;Manual lymph drainage;Vasopneumatic Device;Scar mobilization    PT Next Visit Plan measure AROM, Cording release Lt UE from breast to wrist, PROM,   (Pt would like aggressive work)    PT [place] for foam roll    Consulted and Agree with Plan of Care Patient           Patient will benefit from skilled therapeutic intervention in order to improve the following deficits and impairments:  Decreased range of motion,Postural dysfunction,Decreased scar mobility,Impaired UE functional use,Increased fascial restricitons,Decreased strength  Visit Diagnosis: Disorder of the skin and subcutaneous tissue related to radiation, unspecified  Stiffness of left shoulder, not elsewhere classified  Abnormal posture  Malignant neoplasm of lower-inner quadrant of left breast in female, estrogen receptor positive (HCC)  Stiffness of right shoulder, not elsewhere classified  Acquired absence of both breasts     Problem List Patient Active Problem List   Diagnosis Date Noted  . S/P breast reconstruction, bilateral 07/21/2019  . Acquired absence of breast 05/22/2019  . Breast cancer ([place]) 05/14/2019  . Malignant neoplasm of lower-inner quadrant of left breast in female, estrogen  receptor positive ([place]) 03/24/2019  . Allergic dermatitis 12/27/2017  . Reactive airway disease without complication 56/31/4970  . Allergic rhinitis 11/28/2016  . Hyperlipidemia 11/28/2016    [name] 04/21/2020, 5:16 PM  [place], [place], [address] Phone: [phone]   Fax:  [phone]  Name: Anastazja Isaac MRN: [id number] Date of Birth: 30-Apr-1971

## 2020-04-26 ENCOUNTER — Other Ambulatory Visit: Payer: Self-pay

## 2020-04-26 ENCOUNTER — Ambulatory Visit: Payer: BC Managed Care – PPO

## 2020-04-26 DIAGNOSIS — L599 Disorder of the skin and subcutaneous tissue related to radiation, unspecified: Secondary | ICD-10-CM | POA: Diagnosis not present

## 2020-04-26 DIAGNOSIS — R293 Abnormal posture: Secondary | ICD-10-CM

## 2020-04-26 DIAGNOSIS — M25612 Stiffness of left shoulder, not elsewhere classified: Secondary | ICD-10-CM

## 2020-04-26 DIAGNOSIS — Z9013 Acquired absence of bilateral breasts and nipples: Secondary | ICD-10-CM

## 2020-04-26 DIAGNOSIS — M25611 Stiffness of right shoulder, not elsewhere classified: Secondary | ICD-10-CM

## 2020-04-26 DIAGNOSIS — Z170 Estrogen receptor positive status [ER+]: Secondary | ICD-10-CM

## 2020-04-26 IMAGING — MR MR BREAST BX W LOC DEV 1ST LESION IMAGE BX SPEC MR GUIDE*L*
6 of 8 series · 31 of 48 positions shown · IV contrast (gadavist)
Comparison: Previous exams.
COMPARISON: Previous exams.

Addendum:
CLINICAL DATA: 38-year-old female presenting for MRI guided biopsy
of 2 sites in the right breast and 1 site in the left breast. She
has known left breast cancer.

EXAM:
MRI GUIDED CORE NEEDLE BIOPSY OF THE BILATERAL BREAST
TECHNIQUE: Multiplanar, multisequence MR imaging of the bilateral breasts was
performed both before and after administration of intravenous
contrast.
CONTRAST:  6mL GADAVIST GADOBUTROL 1 MMOL/ML IV SOLN

[Series 2: fiducial bilateral · sagittal · 2.0mm · 1.33mm/px · 6 of 144 slices shown]
[im 1/144]
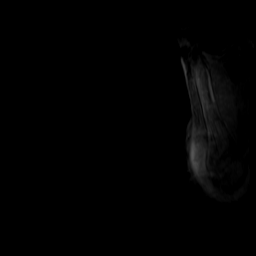
[im 29/144]
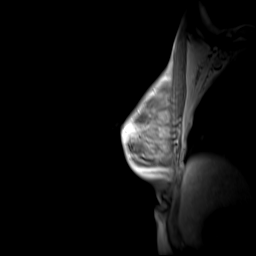
[im 58/144]
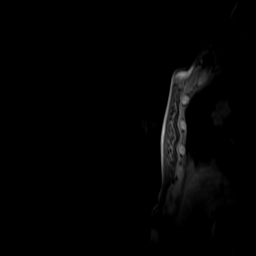
[im 86/144]
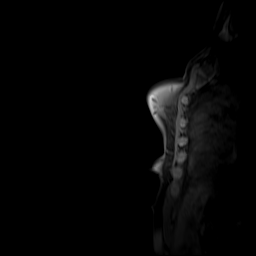
[im 115/144]
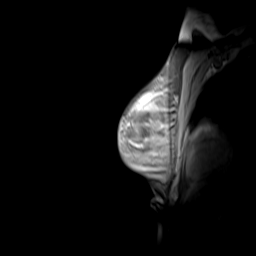
[im 144/144]
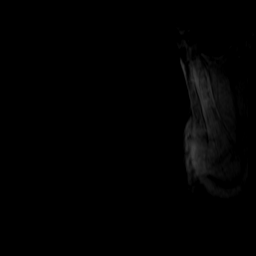

[Series 3: dynamic pre · axial · non-contrast · 1.3mm · 0.73mm/px · z∈[-85,+121]mm · 6 of 160 slices shown]
[im 1/160]
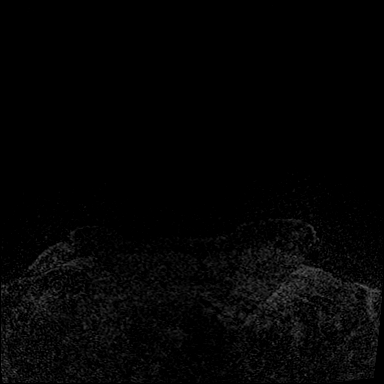
[im 32/160]
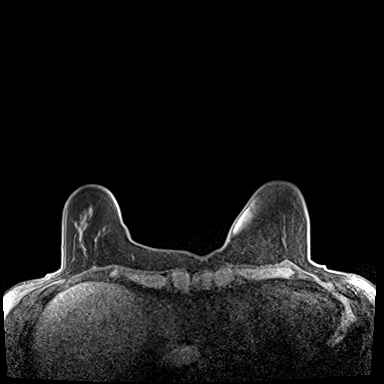
[im 64/160]
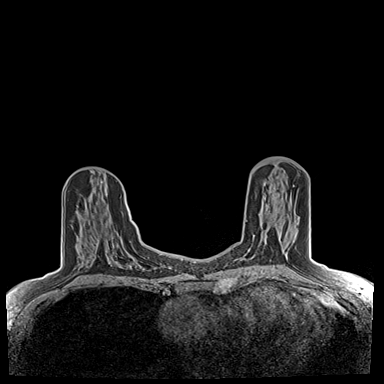
[im 96/160]
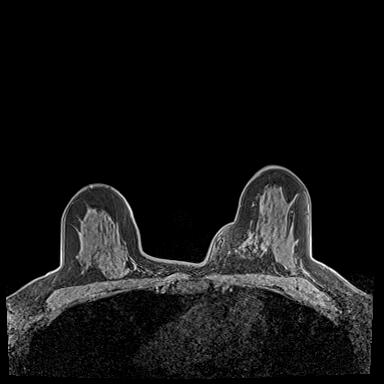
[im 128/160]
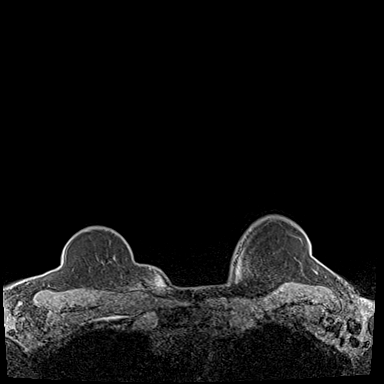
[im 160/160]
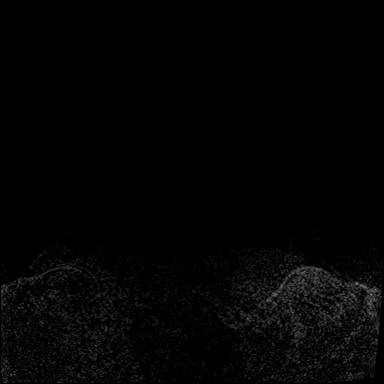

[Series 4: dynamic post 20 · axial · 1.3mm · 0.73mm/px · z∈[-85,+121]mm · 6 of 160 slices shown (1 of 2)]
[im 1/160]
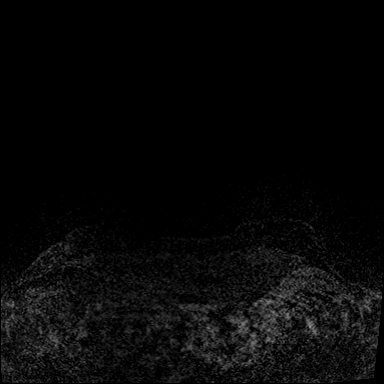
[im 32/160]
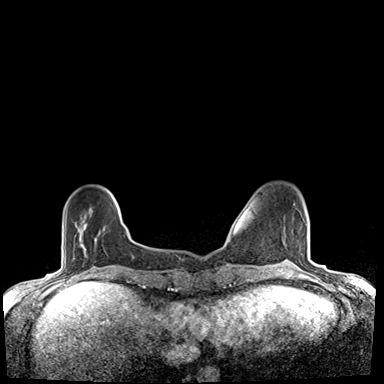
[im 64/160]
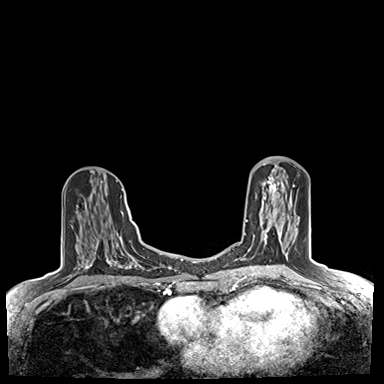
[im 96/160]
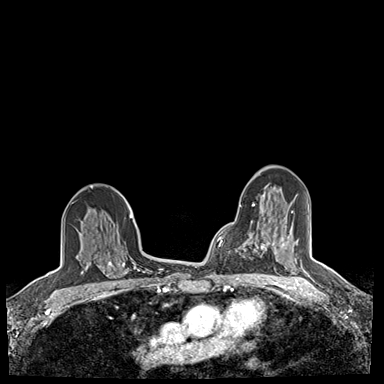
[im 128/160]
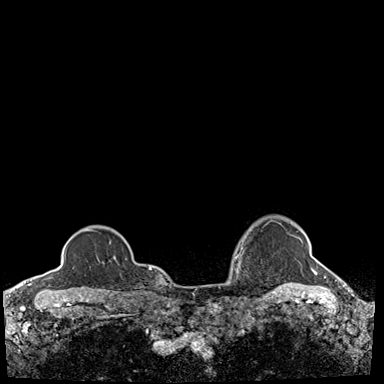
[im 160/160]
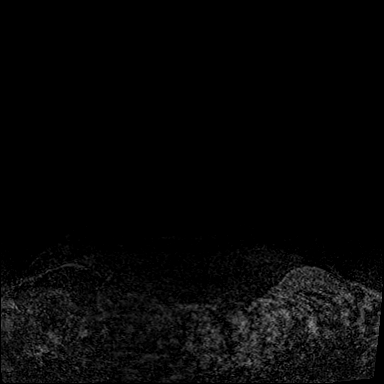

[Series 5: dynamic post 20 · axial · 1.3mm · 0.73mm/px · z∈[-85,+121]mm · 6 of 160 slices shown (2 of 2)]
[im 1/160]
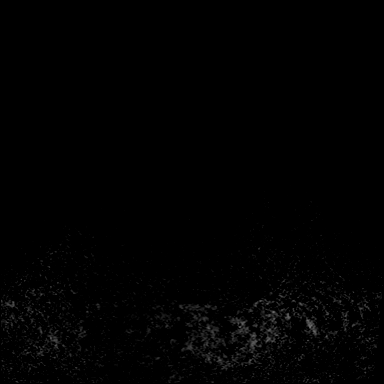
[im 32/160]
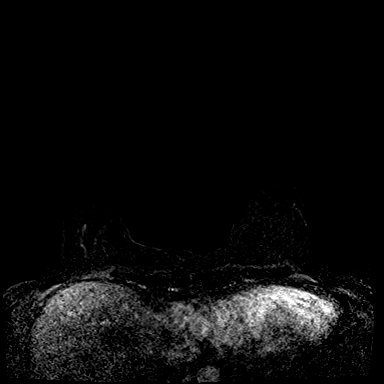
[im 64/160]
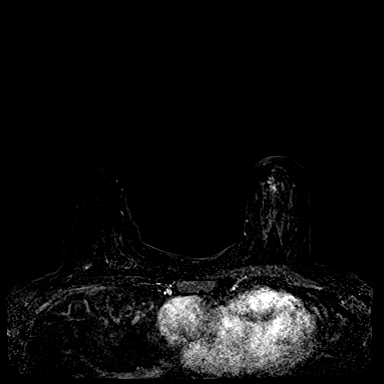
[im 96/160]
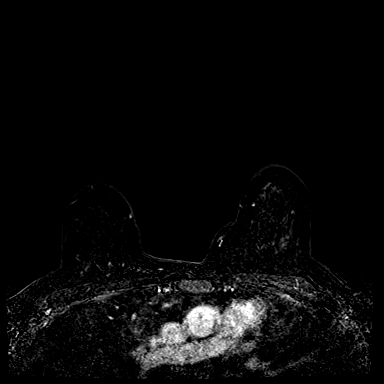
[im 128/160]
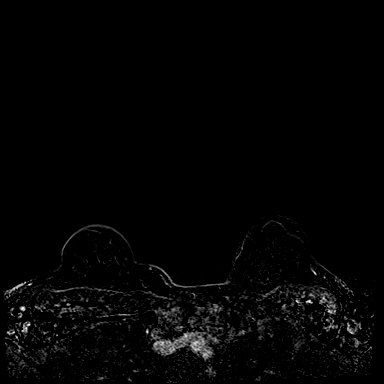
[im 160/160]
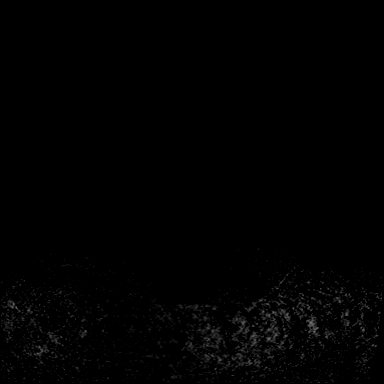

[Series 6: dynamic post 3 · axial · 1.3mm · 0.73mm/px · z∈[-85,+121]mm · 6 of 160 slices shown (1 of 2)]
[im 1/160]
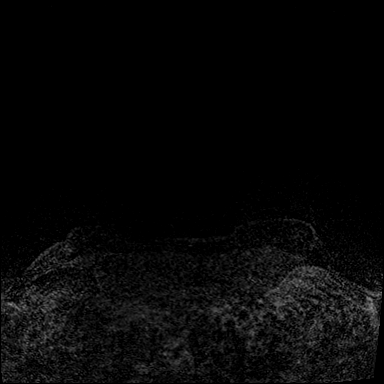
[im 32/160]
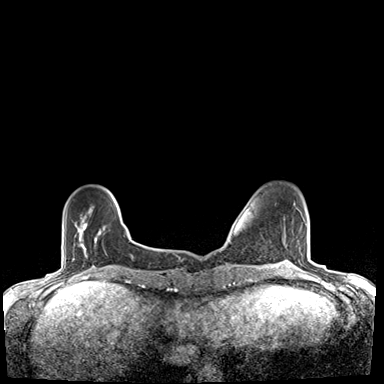
[im 64/160]
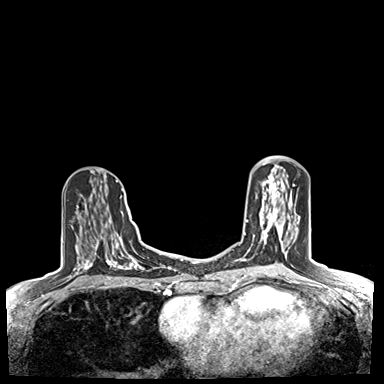
[im 96/160]
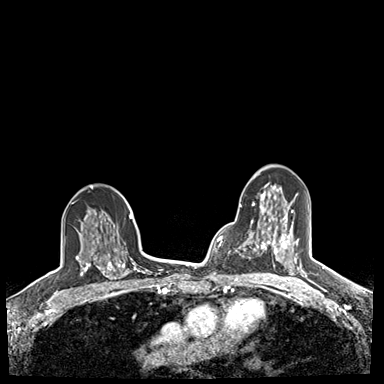
[im 128/160]
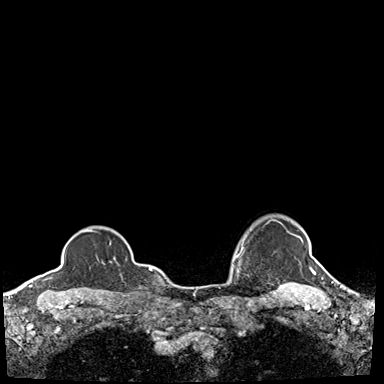
[im 160/160]
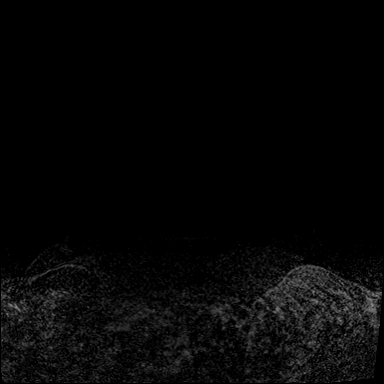

[Series 7: dynamic post 3 · axial · 1.3mm · 0.73mm/px · 1 of 160 slices shown (2 of 2)]
[im 1/160]
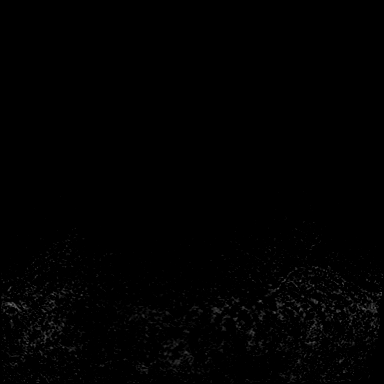

[31 of 48 positions shown; findings below may reference images not displayed]

FINDINGS: I met with the patient, and we discussed the procedure of MRI guided
biopsy, including risks, benefits, and alternatives. Specifically,
we discussed the risks of infection, bleeding, tissue injury, clip
migration, and inadequate sampling. Informed, written consent was
given. The usual time out protocol was performed immediately prior
to the procedure.

Using sterile technique, 1% Lidocaine, MRI guidance, and a 9 gauge
vacuum assisted device, biopsy was performed of a mass in the lower
outer left breast using a lateral approach. At the conclusion of the
procedure, a dumbbell-shaped tissue marker clip was deployed into
the biopsy cavity.

Using sterile technique, 1% Lidocaine, MRI guidance, and a 9 gauge
vacuum assisted device, biopsy was performed of non mass enhancement
in the lower-inner quadrant of the right breast using a lateral
approach. At the conclusion of the procedure, a cylinder shaped
tissue marker clip was deployed into the biopsy cavity.

Using sterile technique, 1% Lidocaine, MRI guidance, and a 9 gauge
vacuum assisted device, biopsy was performed of non mass enhancement
in the lower outer right breast using a lateral approach. At the
conclusion of the procedure, a dumbbell-shaped tissue marker clip
was deployed into the biopsy cavity.

Follow-up 2-view mammogram was performed and dictated separately.
IMPRESSION: 1. MRI guided biopsy of a mass in the lower-outer quadrant of the
left breast. No apparent complications.

2. MRI guided biopsy of non mass enhancement in the lower-inner
right breast. No apparent complications.

3. MRI guided biopsy of non mass enhancement in the lower-outer
right breast. No apparent complications.

ADDENDUM:
Pathology revealed GRADE II INVASIVE MAMMARY CARCINOMA of the LEFT
breast, lower outer (dumbbell clip). This was found to be concordant
by Dr. Elzenir Bido.

Pathology revealed FIBROCYSTIC AND FIBROADENOMATOID CHANGE,
PSEUDOANGIOMATOUS STROMAL HYPERPLASIA (PASH) of the RIGHT breast,
lower inner (cylinder clip). This was found to be concordant by Dr.
Elzenir Bido.

Pathology revealed FIBROCYSTIC AND FIBROADENOMATOID CHANGE,
PSEUDOANGIOMATOUS STROMAL HYPERPLASIA (PASH) of the RIGHT breast,
lower outer (dumbbell clip). This was found to be concordant by Dr.
Elzenir Bido.

Pathology results were discussed with the patient by telephone. The
patient reported doing well after the biopsies with tenderness at
the sites. Post biopsy instructions and care were reviewed and
questions were answered. The patient was encouraged to call The

The patient has a recent diagnosis of LEFT breast cancer and should
follow her outlined treatment plan.

The patient was instructed to return for a bilateral breast MRI in 6
months, per protocol, unless she decides to have bilateral
mastectomies.

Dr. Amnon Tiger was notified of biopsy results via [REDACTED] message on
April 10, 2019.

Pathology results reported by Nonoh Namba, RN on 04/10/2019.

*** End of Addendum ***
FINDINGS: I met with the patient, and we discussed the procedure of MRI guided
biopsy, including risks, benefits, and alternatives. Specifically,
we discussed the risks of infection, bleeding, tissue injury, clip
migration, and inadequate sampling. Informed, written consent was
given. The usual time out protocol was performed immediately prior
to the procedure.

Using sterile technique, 1% Lidocaine, MRI guidance, and a 9 gauge
vacuum assisted device, biopsy was performed of a mass in the lower
outer left breast using a lateral approach. At the conclusion of the
procedure, a dumbbell-shaped tissue marker clip was deployed into
the biopsy cavity.

Using sterile technique, 1% Lidocaine, MRI guidance, and a 9 gauge
vacuum assisted device, biopsy was performed of non mass enhancement
in the lower-inner quadrant of the right breast using a lateral
approach. At the conclusion of the procedure, a cylinder shaped
tissue marker clip was deployed into the biopsy cavity.

Using sterile technique, 1% Lidocaine, MRI guidance, and a 9 gauge
vacuum assisted device, biopsy was performed of non mass enhancement
in the lower outer right breast using a lateral approach. At the
conclusion of the procedure, a dumbbell-shaped tissue marker clip
was deployed into the biopsy cavity.

Follow-up 2-view mammogram was performed and dictated separately.
IMPRESSION: 1. MRI guided biopsy of a mass in the lower-outer quadrant of the
left breast. No apparent complications.

2. MRI guided biopsy of non mass enhancement in the lower-inner
right breast. No apparent complications.

3. MRI guided biopsy of non mass enhancement in the lower-outer
right breast. No apparent complications.

## 2020-04-26 IMAGING — MR MR BREAST BX W/ LOC DEV 1ST LEASION IMAGE BX SPEC MR GUIDE*R*
6 of 8 series · 31 of 48 positions shown · IV contrast (6 ml gadavist)
Comparison: Previous exams.
COMPARISON: Previous exams.

Addendum:
CLINICAL DATA: 38-year-old female presenting for MRI guided biopsy
of 2 sites in the right breast and 1 site in the left breast. She
has known left breast cancer.

EXAM:
MRI GUIDED CORE NEEDLE BIOPSY OF THE BILATERAL BREAST
TECHNIQUE: Multiplanar, multisequence MR imaging of the bilateral breasts was
performed both before and after administration of intravenous
contrast.
CONTRAST:  6mL GADAVIST GADOBUTROL 1 MMOL/ML IV SOLN

[Series 2: fiducial bilateral · sagittal · 2.0mm · 1.33mm/px · 6 of 144 slices shown]
[im 1/144]
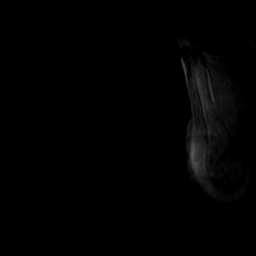
[im 29/144]
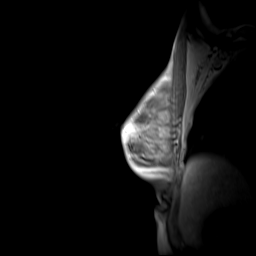
[im 58/144]
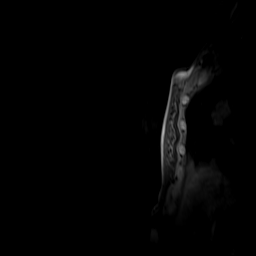
[im 86/144]
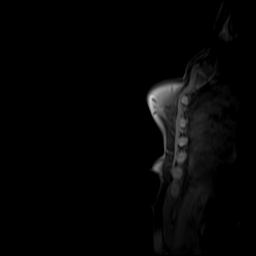
[im 115/144]
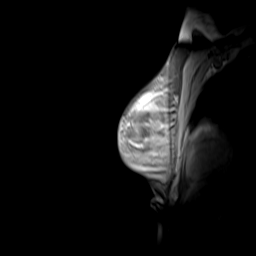
[im 144/144]
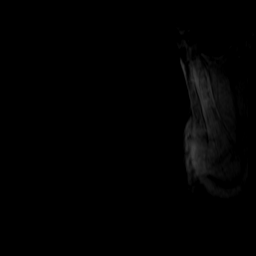

[Series 3: dynamic pre · axial · non-contrast · 1.3mm · 0.73mm/px · z∈[-85,+121]mm · 6 of 160 slices shown]
[im 1/160]
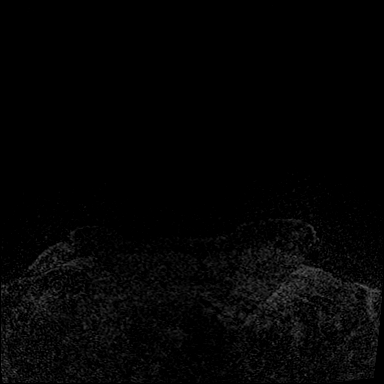
[im 32/160]
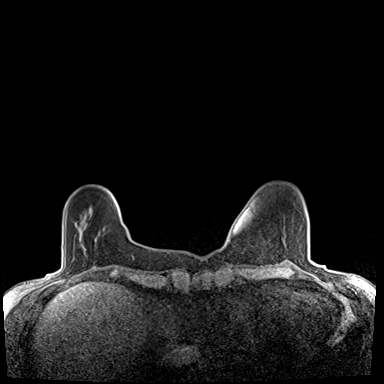
[im 64/160]
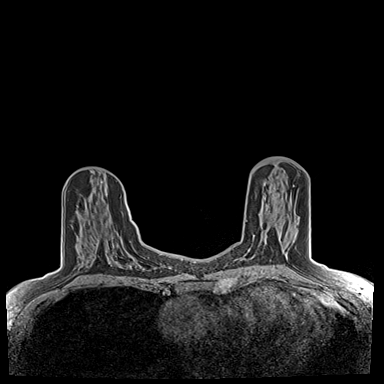
[im 96/160]
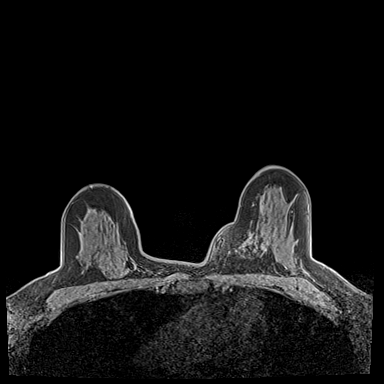
[im 128/160]
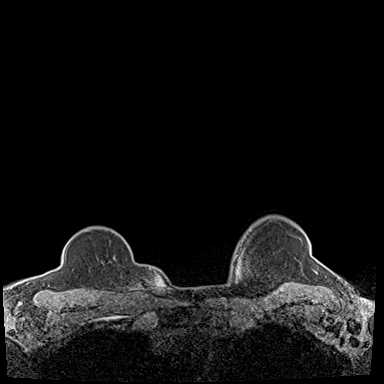
[im 160/160]
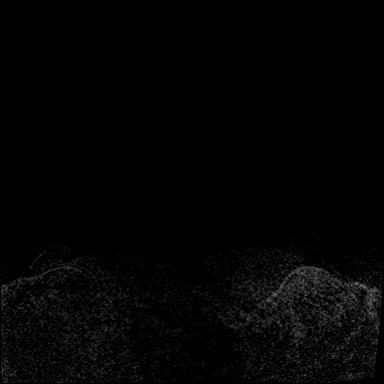

[Series 4: dynamic post 20 · axial · 1.3mm · 0.73mm/px · z∈[-85,+121]mm · 6 of 160 slices shown (1 of 2)]
[im 1/160]
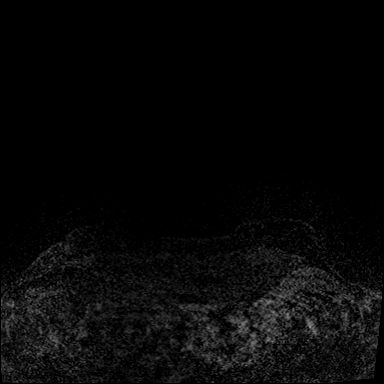
[im 32/160]
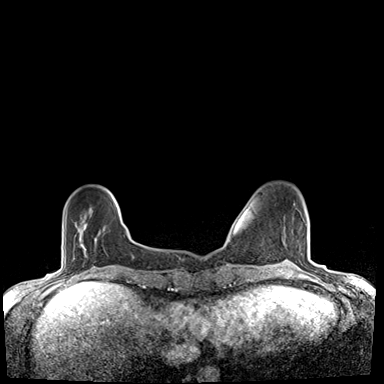
[im 64/160]
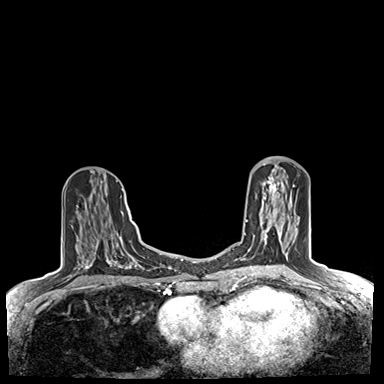
[im 96/160]
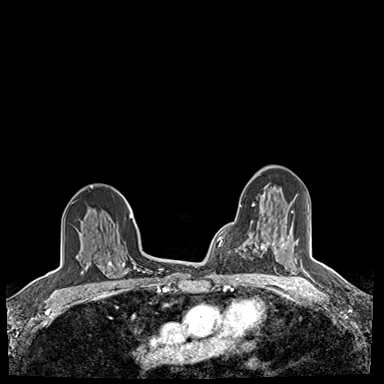
[im 128/160]
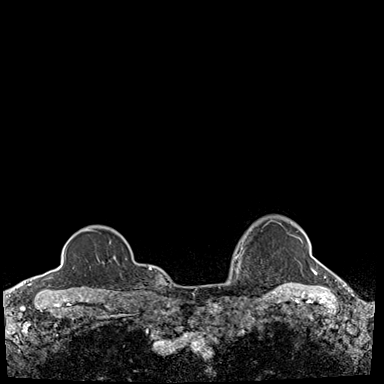
[im 160/160]
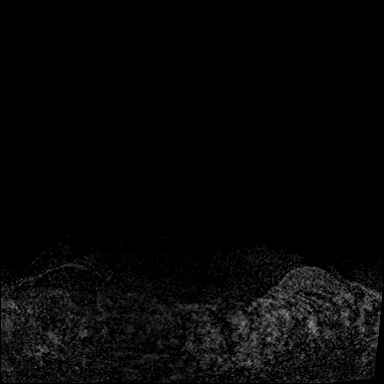

[Series 5: dynamic post 20 · axial · 1.3mm · 0.73mm/px · z∈[-85,+121]mm · 6 of 160 slices shown (2 of 2)]
[im 1/160]
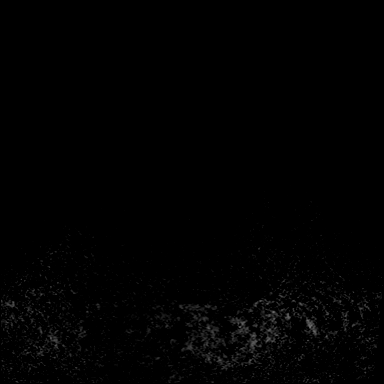
[im 32/160]
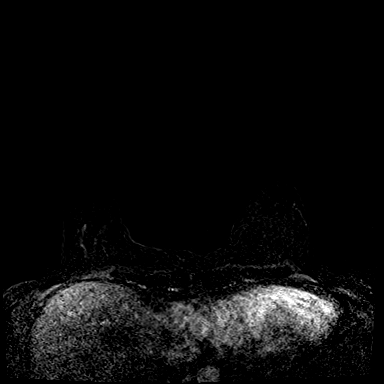
[im 64/160]
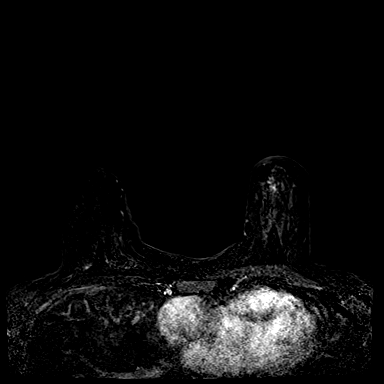
[im 96/160]
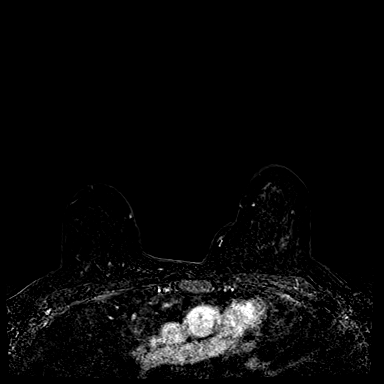
[im 128/160]
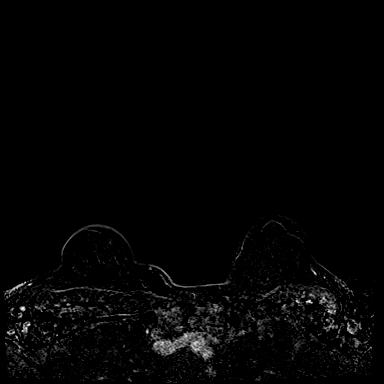
[im 160/160]
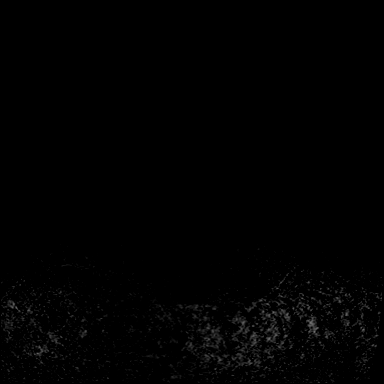

[Series 6: dynamic post 3 · axial · 1.3mm · 0.73mm/px · z∈[-85,+121]mm · 6 of 160 slices shown (1 of 2)]
[im 1/160]
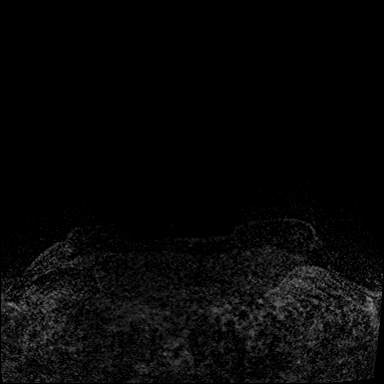
[im 32/160]
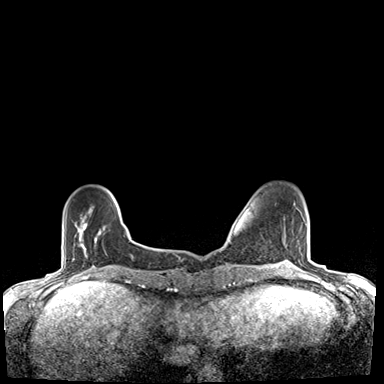
[im 64/160]
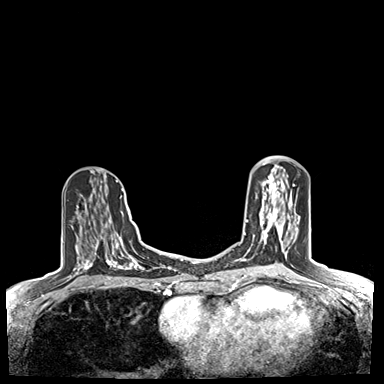
[im 96/160]
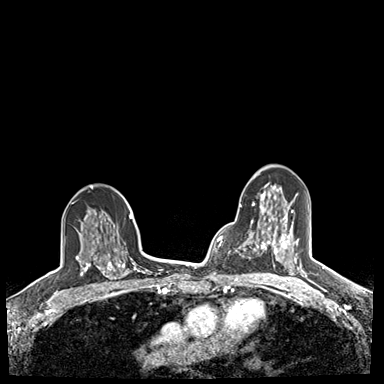
[im 128/160]
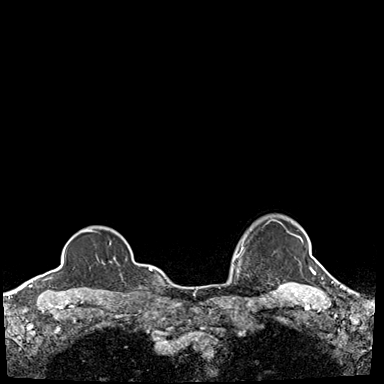
[im 160/160]
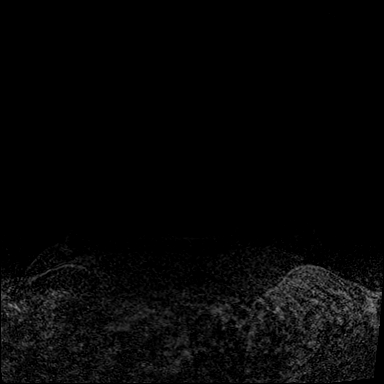

[Series 7: dynamic post 3 · axial · 1.3mm · 0.73mm/px · 1 of 160 slices shown (2 of 2)]
[im 1/160]
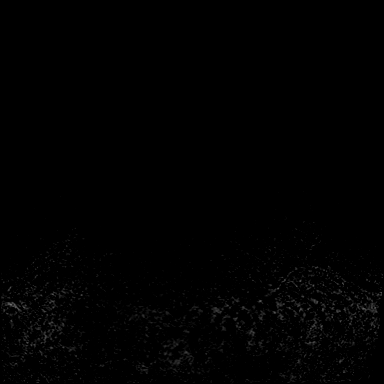

[31 of 48 positions shown; findings below may reference images not displayed]

FINDINGS: I met with the patient, and we discussed the procedure of MRI guided
biopsy, including risks, benefits, and alternatives. Specifically,
we discussed the risks of infection, bleeding, tissue injury, clip
migration, and inadequate sampling. Informed, written consent was
given. The usual time out protocol was performed immediately prior
to the procedure.

Using sterile technique, 1% Lidocaine, MRI guidance, and a 9 gauge
vacuum assisted device, biopsy was performed of a mass in the lower
outer left breast using a lateral approach. At the conclusion of the
procedure, a dumbbell-shaped tissue marker clip was deployed into
the biopsy cavity.

Using sterile technique, 1% Lidocaine, MRI guidance, and a 9 gauge
vacuum assisted device, biopsy was performed of non mass enhancement
in the lower-inner quadrant of the right breast using a lateral
approach. At the conclusion of the procedure, a cylinder shaped
tissue marker clip was deployed into the biopsy cavity.

Using sterile technique, 1% Lidocaine, MRI guidance, and a 9 gauge
vacuum assisted device, biopsy was performed of non mass enhancement
in the lower outer right breast using a lateral approach. At the
conclusion of the procedure, a dumbbell-shaped tissue marker clip
was deployed into the biopsy cavity.

Follow-up 2-view mammogram was performed and dictated separately.
IMPRESSION: 1. MRI guided biopsy of a mass in the lower-outer quadrant of the
left breast. No apparent complications.

2. MRI guided biopsy of non mass enhancement in the lower-inner
right breast. No apparent complications.

3. MRI guided biopsy of non mass enhancement in the lower-outer
right breast. No apparent complications.

ADDENDUM:
Pathology revealed GRADE II INVASIVE MAMMARY CARCINOMA of the LEFT
breast, lower outer (dumbbell clip). This was found to be concordant
by Dr. Elzenir Bido.

Pathology revealed FIBROCYSTIC AND FIBROADENOMATOID CHANGE,
PSEUDOANGIOMATOUS STROMAL HYPERPLASIA (PASH) of the RIGHT breast,
lower inner (cylinder clip). This was found to be concordant by Dr.
Elzenir Bido.

Pathology revealed FIBROCYSTIC AND FIBROADENOMATOID CHANGE,
PSEUDOANGIOMATOUS STROMAL HYPERPLASIA (PASH) of the RIGHT breast,
lower outer (dumbbell clip). This was found to be concordant by Dr.
Elzenir Bido.

Pathology results were discussed with the patient by telephone. The
patient reported doing well after the biopsies with tenderness at
the sites. Post biopsy instructions and care were reviewed and
questions were answered. The patient was encouraged to call The

The patient has a recent diagnosis of LEFT breast cancer and should
follow her outlined treatment plan.

The patient was instructed to return for a bilateral breast MRI in 6
months, per protocol, unless she decides to have bilateral
mastectomies.

Dr. Amnon Tiger was notified of biopsy results via [REDACTED] message on
April 10, 2019.

Pathology results reported by Nonoh Namba, RN on 04/10/2019.

*** End of Addendum ***
FINDINGS: I met with the patient, and we discussed the procedure of MRI guided
biopsy, including risks, benefits, and alternatives. Specifically,
we discussed the risks of infection, bleeding, tissue injury, clip
migration, and inadequate sampling. Informed, written consent was
given. The usual time out protocol was performed immediately prior
to the procedure.

Using sterile technique, 1% Lidocaine, MRI guidance, and a 9 gauge
vacuum assisted device, biopsy was performed of a mass in the lower
outer left breast using a lateral approach. At the conclusion of the
procedure, a dumbbell-shaped tissue marker clip was deployed into
the biopsy cavity.

Using sterile technique, 1% Lidocaine, MRI guidance, and a 9 gauge
vacuum assisted device, biopsy was performed of non mass enhancement
in the lower-inner quadrant of the right breast using a lateral
approach. At the conclusion of the procedure, a cylinder shaped
tissue marker clip was deployed into the biopsy cavity.

Using sterile technique, 1% Lidocaine, MRI guidance, and a 9 gauge
vacuum assisted device, biopsy was performed of non mass enhancement
in the lower outer right breast using a lateral approach. At the
conclusion of the procedure, a dumbbell-shaped tissue marker clip
was deployed into the biopsy cavity.

Follow-up 2-view mammogram was performed and dictated separately.
IMPRESSION: 1. MRI guided biopsy of a mass in the lower-outer quadrant of the
left breast. No apparent complications.

2. MRI guided biopsy of non mass enhancement in the lower-inner
right breast. No apparent complications.

3. MRI guided biopsy of non mass enhancement in the lower-outer
right breast. No apparent complications.

## 2020-04-26 NOTE — Therapy (Signed)
[place], [place], [address] Phone: [phone]   Fax:  [phone]  Physical Therapy Treatment  Patient Details  Name: Alice Davis MRN: [id number] Date of Birth: 01-Jan-1971 Referring Provider (PT): Reita May Date: 04/26/2020   PT End of Session - 04/26/20 1700    Visit Number 16    Number of Visits 20    Date for PT Re-Evaluation 06/07/20    Authorization - Visit Number 16    Authorization - Number of Visits 60    PT Start Time 1600    PT Stop Time 1651    PT Time Calculation (min) 51 min    Activity Tolerance Patient tolerated treatment well    Behavior During Therapy [facility] for tasks assessed/performed           Past Medical History:  Diagnosis Date  . Asthma   . Cancer [facility])     Past Surgical History:  Procedure Laterality Date  . BREAST RECONSTRUCTION WITH PLACEMENT OF TISSUE EXPANDER AND FLEX HD (ACELLULAR HYDRATED DERMIS) Bilateral 05/14/2019   Procedure: IMMEDIATE BREAST RECONSTRUCTION WITH PLACEMENT OF TISSUE EXPANDER AND FLEX HD (ACELLULAR HYDRATED DERMIS);  Surgeon: Wallace Going, DO;  Location: [place];  Service: Plastics;  Laterality: Bilateral;  . NASAL SINUS SURGERY     20 years ago  . NIPPLE SPARING MASTECTOMY WITH SENTINEL LYMPH NODE BIOPSY Bilateral 05/14/2019   Procedure: BILATERAL NIPPLE SPARING MASTECTOMIES WITH LEFT SENTINEL LYMPH NODE BIOPSY;  Surgeon: [name], MD;  Location: [place];  Service: General;  Laterality: Bilateral;  PEC  . REMOVAL OF BILATERAL TISSUE EXPANDERS WITH PLACEMENT OF BILATERAL BREAST IMPLANTS Bilateral 07/13/2019   Procedure: REMOVAL OF BILATERAL TISSUE EXPANDERS WITH PLACEMENT OF BILATERAL BREAST IMPLANTS;  Surgeon: Wallace Going, DO;  Location: [place];  Service: Plastics;  Laterality: Bilateral;  . RHINOPLASTY     20 years  . WISDOM TOOTH EXTRACTION      There were no vitals filed for this visit.    Subjective Assessment - 04/26/20 1603    Subjective Still getting sore and tight where cords are. Not as good exercising at home as I should be.  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 No/denies    Pain Score 0-No pain                             [facility] Adult PT Treatment/Exercise - 04/26/20 0001      Shoulder Exercises: Supine   Other Supine Exercises shoulder flex, D2 flex, Horiz. abd x 5 ea on 1/2 foam roll      Manual Therapy   Soft tissue mobilization to the forearm, upper arm, and pectoralis (Supine) and left scapular region (SL)    Myofascial Release Cross hands along left trunk, in axilla, and along the left arm    Passive ROM of the left shoulder into flexion, abduction, and D2 at various angles with incorporation of pinning at the axilla and various  locations along cording which was from lateral implant to axilla and down the lateral arm towards the wrist but today is only evident in the upper arm.                       PT Long Term Goals - 04/12/20 1719      PT LONG TERM GOAL #1   Title Pt will demonstrate 160 degrees of left shoulder flexion to allow pt to reach overhead    Baseline 152, 168 on 11/24/2019, 148 on 01/19/20 back against wall    Status On-going      PT LONG TERM GOAL #2   Title Pt will demonstratse 160 degrees of left shoulder abduction to allow pt to reach out to the sides    Baseline 147, 175 on 11/24/2019, 149 on 01/19/20    Status On-going      PT LONG TERM GOAL #3    Title Pt will report a 75% improvement in tightness and discomfort in left lateral trunk with end range shoulder ROM for improved comfort.    Status Achieved      PT LONG TERM GOAL #4   Title Pt will be independent in a home exercise program for continued strengthening and stretching    Status On-going      PT LONG TERM GOAL #5   Title Pt will report no pull in the left UE with AROM due to cording    Time 8    Period Weeks    Status New                 Plan - 04/26/20 1703    Clinical Impression Statement pt continues to feel cording especially in the axilla region with several cords felt and 1 particularly thick cord.  A/PROM is overall improved and pt. feels looser after rx.  Pt encouraged to do exs at home.    Examination-Activity Limitations Reach Overhead    Stability/Clinical Decision Making Stable/Uncomplicated    Rehab Potential Good    PT Frequency 1x / week    PT Duration 8 weeks    PT Treatment/Interventions ADLs/Self Care Home Management;Manual techniques;Patient/family education;Therapeutic exercise;Passive range of motion;Manual lymph drainage;Vasopneumatic Device;Scar mobilization    PT Next Visit Plan measure AROM (forgotten today), Cording release Lt UE from breast to wrist, PROM,   (Pt would like aggressive work)    PT [place] for foam roll    Consulted and Agree with Plan of Care Patient           Patient will benefit from skilled therapeutic intervention in order to improve the following deficits and impairments:  Decreased range of motion,Postural dysfunction,Decreased scar mobility,Impaired UE functional use,Increased fascial restricitons,Decreased strength  Visit Diagnosis: Disorder of the skin and subcutaneous tissue related to radiation, unspecified  Stiffness of left shoulder, not elsewhere classified  Abnormal posture  Malignant neoplasm of lower-inner quadrant of left breast in female, estrogen receptor positive  (HCC)  Stiffness of right shoulder, not elsewhere classified  Acquired absence of both breasts     Problem List Patient Active Problem List   Diagnosis Date Noted  . S/P breast reconstruction, bilateral 07/21/2019  . Acquired absence of breast 05/22/2019  . Breast cancer ([place]) 05/14/2019  . Malignant neoplasm of lower-inner quadrant of left breast in female, estrogen receptor positive ([place]) 03/24/2019  . Allergic dermatitis 12/27/2017  . Reactive airway disease without complication 79/15/0569  . Allergic rhinitis 11/28/2016  . Hyperlipidemia  11/28/2016    Claris Pong 04/26/2020, 5:07 PM  [place], [place], [address] Phone: [phone]   Fax:  [phone]  Name: Shaunte Tuft MRN: [id number] Date of Birth: Jun 23, 1970  Cheral Almas, PT 04/26/20 5:08 PM

## 2020-04-28 ENCOUNTER — Encounter (INDEPENDENT_AMBULATORY_CARE_PROVIDER_SITE_OTHER): Payer: Self-pay

## 2020-05-03 ENCOUNTER — Encounter: Payer: Self-pay | Admitting: Rehabilitation

## 2020-05-03 ENCOUNTER — Ambulatory Visit: Payer: BC Managed Care – PPO | Admitting: Rehabilitation

## 2020-05-03 ENCOUNTER — Other Ambulatory Visit: Payer: Self-pay

## 2020-05-03 DIAGNOSIS — R293 Abnormal posture: Secondary | ICD-10-CM

## 2020-05-03 DIAGNOSIS — M25611 Stiffness of right shoulder, not elsewhere classified: Secondary | ICD-10-CM

## 2020-05-03 DIAGNOSIS — Z9013 Acquired absence of bilateral breasts and nipples: Secondary | ICD-10-CM

## 2020-05-03 DIAGNOSIS — Z170 Estrogen receptor positive status [ER+]: Secondary | ICD-10-CM

## 2020-05-03 DIAGNOSIS — M25612 Stiffness of left shoulder, not elsewhere classified: Secondary | ICD-10-CM

## 2020-05-03 DIAGNOSIS — L599 Disorder of the skin and subcutaneous tissue related to radiation, unspecified: Secondary | ICD-10-CM | POA: Diagnosis not present

## 2020-05-03 DIAGNOSIS — C50312 Malignant neoplasm of lower-inner quadrant of left female breast: Secondary | ICD-10-CM

## 2020-05-03 NOTE — Therapy (Signed)
[place], [place], [address] Phone: [phone]   Fax:  [phone]  Physical Therapy Treatment  Patient Details  Name: Alice Davis MRN: [id number] Date of Birth: 07-24-1970 Referring Provider (PT): Reita May Date: 05/03/2020   PT End of Session - 05/03/20 1656    Visit Number 17    Number of Visits 20    Date for PT Re-Evaluation 06/07/20    Authorization - Visit Number 71    Authorization - Number of Visits 60    PT Start Time 1600    PT Stop Time 1645    PT Time Calculation (min) 45 min    Activity Tolerance Patient tolerated treatment well    Behavior During Therapy [facility] for tasks assessed/performed           Past Medical History:  Diagnosis Date  . Asthma   . Cancer [facility])     Past Surgical History:  Procedure Laterality Date  . BREAST RECONSTRUCTION WITH PLACEMENT OF TISSUE EXPANDER AND FLEX HD (ACELLULAR HYDRATED DERMIS) Bilateral 05/14/2019   Procedure: IMMEDIATE BREAST RECONSTRUCTION WITH PLACEMENT OF TISSUE EXPANDER AND FLEX HD (ACELLULAR HYDRATED DERMIS);  Surgeon: Wallace Going, DO;  Location: [place];  Service: Plastics;  Laterality: Bilateral;  . NASAL SINUS SURGERY     20 years ago  . NIPPLE SPARING MASTECTOMY WITH SENTINEL LYMPH NODE BIOPSY Bilateral 05/14/2019   Procedure: BILATERAL NIPPLE SPARING MASTECTOMIES WITH LEFT SENTINEL LYMPH NODE BIOPSY;  Surgeon: [name], MD;  Location: [place];  Service: General;  Laterality: Bilateral;  PEC  . REMOVAL OF BILATERAL TISSUE EXPANDERS WITH PLACEMENT OF BILATERAL BREAST IMPLANTS Bilateral 07/13/2019   Procedure: REMOVAL OF BILATERAL TISSUE EXPANDERS WITH PLACEMENT OF BILATERAL BREAST IMPLANTS;  Surgeon: Wallace Going, DO;  Location: [place];  Service: Plastics;  Laterality: Bilateral;  . RHINOPLASTY     20 years  . WISDOM TOOTH EXTRACTION      There were no vitals filed for this visit.    Subjective Assessment - 05/03/20 1556    Subjective I am doing pretty well.    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 No/denies              [facility] PT Assessment - 05/03/20 0001      AROM   Left Shoulder Flexion 158 Degrees    Left Shoulder ABduction 125 Degrees   tight in the pectoralis, 145 post treatment                        [facility] Adult PT Treatment/Exercise - 05/03/20 0001      Manual Therapy   Soft tissue mobilization to the forearm, upper arm, and pectoralis (Supine) and left scapular region (SL)    Myofascial Release Cross hands along left trunk, in axilla, and along the left arm    Passive ROM of the left shoulder into flexion, abduction, and D2 at various angles with incorporation of pinning at the axilla and various locations along cording which was from only evident in the  upper arm.                       PT Long Term Goals - 04/12/20 1719      PT LONG TERM GOAL #1   Title Pt will demonstrate 160 degrees of left shoulder flexion to allow pt to reach overhead    Baseline 152, 168 on 11/24/2019, 148 on 01/19/20 back against wall    Status On-going      PT LONG TERM GOAL #2   Title Pt will demonstratse 160 degrees of left shoulder abduction to allow pt to reach out to the sides    Baseline 147, 175 on 11/24/2019, 149 on 01/19/20    Status On-going      PT LONG TERM GOAL #3   Title Pt will report a 75% improvement in tightness and discomfort in left lateral trunk with end range shoulder ROM for improved comfort.    Status Achieved       PT LONG TERM GOAL #4   Title Pt will be independent in a home exercise program for continued strengthening and stretching    Status On-going      PT LONG TERM GOAL #5   Title Pt will report no pull in the left UE with AROM due to cording    Time 8    Period Weeks    Status New                 Plan - 05/03/20 1657    Clinical Impression Statement Pt continues with chronic tightness in the anterior chest and upper arm.  AROM into abduction was decreased today compared to prior sessions but improved post treatment to baseline as of late.  Pt will return for SOZO screen in a few weeks and let Mateo Flow know if she thinks she needs any more PT appts.    PT Frequency 1x / week    PT Duration 8 weeks    PT Treatment/Interventions ADLs/Self Care Home Management;Manual techniques;Patient/family education;Therapeutic exercise;Passive range of motion;Manual lymph drainage;Vasopneumatic Device;Scar mobilization    PT Next Visit Plan Cording release Lt UE from breast to wrist, PROM,   (Pt would like aggressive work)    Oncologist with Plan of Care Patient           Patient will benefit from skilled therapeutic intervention in order to improve the following deficits and impairments:     Visit Diagnosis: Disorder of the skin and subcutaneous tissue related to radiation, unspecified  Stiffness of left shoulder, not elsewhere classified  Abnormal posture  Malignant neoplasm of lower-inner quadrant of left breast in female, estrogen receptor positive (HCC)  Stiffness of right shoulder, not elsewhere classified  Acquired absence of both breasts     Problem List Patient Active Problem List   Diagnosis Date Noted  . S/P breast reconstruction, bilateral 07/21/2019  . Acquired absence of breast 05/22/2019  . Breast cancer ([place]) 05/14/2019  . Malignant neoplasm of lower-inner quadrant of left breast in female, estrogen receptor positive ([place]) 03/24/2019  . Allergic  dermatitis 12/27/2017  . Reactive airway disease without complication 62/26/3335  . Allergic rhinitis 11/28/2016  . Hyperlipidemia 11/28/2016    [name] 05/03/2020, 4:59 PM  [place], [place], [address] Phone: [phone]   Fax:  [phone]  Name: Courtnie Brenes MRN: [id number] Date of Birth: Oct 20, 1970

## 2020-05-05 ENCOUNTER — Encounter (INDEPENDENT_AMBULATORY_CARE_PROVIDER_SITE_OTHER): Payer: Self-pay

## 2020-05-12 ENCOUNTER — Encounter (INDEPENDENT_AMBULATORY_CARE_PROVIDER_SITE_OTHER): Payer: Self-pay

## 2020-05-16 ENCOUNTER — Ambulatory Visit: Payer: BC Managed Care – PPO | Attending: Radiation Oncology | Admitting: Physical Therapy

## 2020-05-16 ENCOUNTER — Other Ambulatory Visit: Payer: Self-pay

## 2020-05-16 DIAGNOSIS — Z483 Aftercare following surgery for neoplasm: Secondary | ICD-10-CM | POA: Insufficient documentation

## 2020-05-16 NOTE — Therapy (Signed)
[place], [place], [address] Phone: [phone]   Fax:  [phone]  Physical Therapy Treatment  Patient Details  Name: Alice Davis MRN: [id number] Date of Birth: Jul 08, 1970 Referring Provider (PT): Reita May Date: 05/16/2020   PT End of Session - 05/16/20 1636    Visit Number 17    Number of Visits 20    PT Start Time 9485    PT Stop Time 1625    PT Time Calculation (min) 5 min           Past Medical History:  Diagnosis Date  . Asthma   . Cancer [facility])     Past Surgical History:  Procedure Laterality Date  . BREAST RECONSTRUCTION WITH PLACEMENT OF TISSUE EXPANDER AND FLEX HD (ACELLULAR HYDRATED DERMIS) Bilateral 05/14/2019   Procedure: IMMEDIATE BREAST RECONSTRUCTION WITH PLACEMENT OF TISSUE EXPANDER AND FLEX HD (ACELLULAR HYDRATED DERMIS);  Surgeon: Wallace Going, DO;  Location: [place];  Service: Plastics;  Laterality: Bilateral;  . NASAL SINUS SURGERY     20 years ago  . NIPPLE SPARING MASTECTOMY WITH SENTINEL LYMPH NODE BIOPSY Bilateral 05/14/2019   Procedure: BILATERAL NIPPLE SPARING MASTECTOMIES WITH LEFT SENTINEL LYMPH NODE BIOPSY;  Surgeon: [name], MD;  Location: [place];  Service: General;  Laterality: Bilateral;  PEC  . REMOVAL OF BILATERAL TISSUE EXPANDERS WITH PLACEMENT OF BILATERAL BREAST IMPLANTS Bilateral 07/13/2019   Procedure: REMOVAL OF BILATERAL TISSUE EXPANDERS WITH PLACEMENT OF BILATERAL BREAST IMPLANTS;  Surgeon: Wallace Going, DO;  Location: [place];  Service: Plastics;  Laterality: Bilateral;  . RHINOPLASTY     20 years  . WISDOM TOOTH EXTRACTION      There were no vitals filed for this visit.   Subjective Assessment - 05/16/20 1635    Subjective Pt here for SOZO screen.  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, grade 2, margins negative, 1/3 lymph node positive ER 80-95 %, PR 80-95 %, HER-2 negative, Ki-67 2-20%right mastectomy: Benign; then bil implant exchange July 13, 2019, pt completed radiation 09/25/19, pt currently tamoxifen                  L-DEX FLOWSHEETS - 05/16/20 1600      L-DEX LYMPHEDEMA SCREENING   Measurement Type Unilateral    L-DEX MEASUREMENT EXTREMITY Upper Extremity    POSITION  Standing    DOMINANT SIDE Right    At Risk Side Left    BASELINE SCORE (UNILATERAL) 1.2    L-DEX SCORE (UNILATERAL) 0                                  PT Long Term Goals - 04/12/20 1719      PT LONG TERM GOAL #1   Title Pt will demonstrate 160 degrees of left shoulder flexion to allow pt to reach overhead    Baseline 152, 168 on 11/24/2019, 148 on 01/19/20 back against wall    Status On-going      PT LONG TERM GOAL #2   Title Pt will demonstratse 160 degrees of left shoulder abduction to allow pt to reach out to the sides    Baseline 147, 175 on 11/24/2019, 149 on 01/19/20  Status On-going      PT LONG TERM GOAL #3   Title Pt will report a 75% improvement in tightness and discomfort in left lateral trunk with end range shoulder ROM for improved comfort.    Status Achieved      PT LONG TERM GOAL #4   Title Pt will be independent in a home exercise program for continued strengthening and stretching    Status On-going      PT LONG TERM GOAL #5   Title Pt will report no pull in the left UE with AROM due to cording    Time 8    Period Weeks    Status New                 Plan - 05/16/20 1636    Clinical Impression Statement Pt measured within recommended range of baseline           Patient will benefit from skilled therapeutic intervention in order to improve the following deficits and impairments:     Visit Diagnosis: Aftercare following surgery for neoplasm     Problem  List Patient Active Problem List   Diagnosis Date Noted  . S/P breast reconstruction, bilateral 07/21/2019  . Acquired absence of breast 05/22/2019  . Breast cancer ([place]) 05/14/2019  . Malignant neoplasm of lower-inner quadrant of left breast in female, estrogen receptor positive ([place]) 03/24/2019  . Allergic dermatitis 12/27/2017  . Reactive airway disease without complication 00/86/7619  . Allergic rhinitis 11/28/2016  . Hyperlipidemia 11/28/2016   Donato Heinz. Owens Shark PT  Norwood Levo 05/16/2020, 4:37 PM  [place], [place], [address] Phone: [phone]   Fax:  [phone]  Name: Pearly Bartosik MRN: [id number] Date of Birth: April 22, 1971

## 2020-05-19 ENCOUNTER — Encounter (INDEPENDENT_AMBULATORY_CARE_PROVIDER_SITE_OTHER): Payer: Self-pay

## 2020-05-26 ENCOUNTER — Encounter (INDEPENDENT_AMBULATORY_CARE_PROVIDER_SITE_OTHER): Payer: Self-pay

## 2020-06-02 ENCOUNTER — Encounter (INDEPENDENT_AMBULATORY_CARE_PROVIDER_SITE_OTHER): Payer: Self-pay

## 2020-06-09 ENCOUNTER — Encounter (INDEPENDENT_AMBULATORY_CARE_PROVIDER_SITE_OTHER): Payer: Self-pay

## 2020-06-16 ENCOUNTER — Encounter (INDEPENDENT_AMBULATORY_CARE_PROVIDER_SITE_OTHER): Payer: Self-pay

## 2020-06-23 ENCOUNTER — Encounter (INDEPENDENT_AMBULATORY_CARE_PROVIDER_SITE_OTHER): Payer: Self-pay

## 2020-06-24 ENCOUNTER — Encounter (INDEPENDENT_AMBULATORY_CARE_PROVIDER_SITE_OTHER): Payer: Self-pay

## 2020-06-30 ENCOUNTER — Encounter (INDEPENDENT_AMBULATORY_CARE_PROVIDER_SITE_OTHER): Payer: Self-pay

## 2020-07-05 NOTE — Assessment & Plan Note (Signed)
03/13/2019:Screening detected left breast abnormalities, 0.6 cm at 8:30 position and 0.9 cm at 8 o'clock position with 1 abnormal left axillary lymph node. 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 ER 90%, PR 90%, HER-2 -1+, Ki-67 20%; biopsy 8:30 position: ER 80%, PR 80%, Ki-67 5%, HER-2 negative; lymph node biopsy negative T1 BN 0 stage Ia clinical stage  Bilateral mastectomies: Left mastectomy: Multifocal invasive lobular cancer largest focus 2.5 cm, grade 2, margins negative, 1/3 lymph node positive ER 80-95 %, PR 80-95 %, HER-2 negative, Ki-67 2-20% right mastectomy: Benign T2 N1 a stage Ib  MammaPrint: Low risk Adjuvant radiation: 08/12/2019-09/25/2019  Treatment plan:Adjuvant antiestrogen therapy: We discussed tamoxifen for 10 years versus tamoxifen for 3 years followed by aromatase inhibitor for 5 years if she is menopausal  Tamoxifen Toxicities:  Breast Cancer Surveillance: 1. Breast Exam: 07/06/20: Benign 2. Mammogram 3. U/S: Left breast: Indeterminate mass left breast 0.9 cm, Biopsy 03/07/21: Benign  RTC in 1 year

## 2020-07-05 NOTE — Progress Notes (Signed)
Patient Care Team: [name], MD as PCP - General (Family Medicine) [name], MD as Attending Physician (Radiation Oncology) [name], MD as Consulting Physician (Hematology and Oncology) [name], MD as Consulting Physician (General Surgery)  DIAGNOSIS:    ICD-10-CM   1. Malignant neoplasm of lower-inner quadrant of left breast in female, estrogen receptor positive ([place])  C50.312 CT CHEST ABDOMEN PELVIS W CONTRAST   Z17.0 NM Bone Scan Whole Body    SUMMARY OF ONCOLOGIC HISTORY: Oncology History  Malignant neoplasm of lower-inner quadrant of left breast in female, estrogen receptor positive ([place])  03/13/2019 Initial Diagnosis   Screening detected left breast abnormalities, 0.6 cm at 8:30 position and 0.9 cm at 8 o'clock position with 1 abnormal left axillary lymph node.  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%, PR 90%, HER-2 -1+, Ki-67 20%; biopsy 8:30 position: ER 80%, PR 80%, Ki-67 5%, HER-2 negative; lymph node biopsy negative   03/31/2019 Cancer Staging   Staging form: Breast, AJCC 8th Edition - Clinical stage from 03/31/2019: Stage IA (cT1b, cN0, cM0, G2, ER+, PR+, HER2-)   05/14/2019 Surgery   Bilateral mastectomies Alice Davis) [phone]):   Left mastectomy: Multifocal invasive lobular cancer largest focus 2.5 cm, grade 2, margins negative, 1/3 lymph node positive ER 80-95 %, PR 80-95 %, HER-2 negative, Ki-67 2-20%  Right mastectomy: Benign   05/21/2019 Cancer Staging   Staging form: Breast, AJCC 8th Edition - Pathologic stage from 05/21/2019: Stage IB (pT2, pN1a, cM0, G2, ER+, PR+, HER2-)   06/01/2019 Oncotype testing   Mammaprint: low risk   08/12/2019 - 09/25/2019 Radiation Therapy   The patient initially received a dose of 50.4 Gy in 28 fractions to the breast using whole-breast tangent fields. She also received 50.4 Gy in 28 fractions to the left supraclavicular region. This was delivered using a 3-D conformal  technique. The pt received a boost delivering an additional 10 Gy in 5 fractions using a electron boost with 46mV electrons. The total dose was 60.4 Gy.   09/2019 - 09/2029 Anti-estrogen oral therapy   Tamoxifen     CHIEF COMPLIANT: Follow-up of left breast cancer on tamoxifen  INTERVAL HISTORY: DCacey Davis a 50y.o. with above-mentioned history of left breast cancer who underwent a left mastectomy with reconstruction, radiation, and is currently on antiestrogen therapy with tamoxifen. She palpated a left breast abnormality. [place] showed an indeterminate mass at the 6:30 position in the left breast. Biopsy on 03/07/20 showed fat necrosis with no evidence of malignancy. She presents to the clinic today for follow-up.  She is complaining of abdominal pain, pelvic pain as well as intense epigastric pain below the left rib cage.  It is constant in nature.  She continues to have hot flashes and arthralgias and myalgias.  ALLERGIES:  is allergic to latex and doxycycline.  MEDICATIONS:  Current Outpatient Medications  Medication Sig Dispense Refill  . cetirizine (ZYRTEC) 10 MG tablet Take 10 mg by mouth daily.     .Marland KitchenELDERBERRY PO Take 15 mLs by mouth daily.    .Marland Kitchenerythromycin with ethanol (EMGEL) 2 % gel Apply topically daily. 30 g 0  . Multiple Vitamins-Minerals (MULTIVITAMIN ADULT EXTRA C PO) Take 1 tablet by mouth daily.     . tamoxifen (NOLVADEX) 20 MG tablet Take 1 tablet (20 mg total) by mouth daily. 7 tablet 52   No current facility-administered medications for this visit.    PHYSICAL EXAMINATION: ECOG  PERFORMANCE STATUS: 1 - Symptomatic but completely ambulatory  Vitals:   07/06/20 1536  BP: (!) 109/57  Pulse: 93  Resp: 20  Temp: 98.1 F (36.7 C)  SpO2: 100%   Filed Weights   07/06/20 1536  Weight: 142 lb 8 oz (64.6 kg)    LABORATORY DATA:  I have reviewed the data as listed No flowsheet data found.  Lab Results  Component Value Date   WBC 6.5 05/11/2019    HGB 14.1 05/11/2019   HCT 44.1 05/11/2019   MCV 93.8 05/11/2019   PLT 279 05/11/2019    ASSESSMENT & PLAN:  Malignant neoplasm of lower-inner quadrant of left breast in female, estrogen receptor positive ([place]) 03/13/2019:Screening detected left breast abnormalities, 0.6 cm at 8:30 position and 0.9 cm at 8 o'clock position with 1 abnormal left axillary lymph node. 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 ER 90%, PR 90%, HER-2 -1+, Ki-67 20%; biopsy 8:30 position: ER 80%, PR 80%, Ki-67 5%, HER-2 negative; lymph node biopsy negative T1 BN 0 stage Ia clinical stage  Bilateral mastectomies: Left mastectomy: Multifocal invasive lobular cancer largest focus 2.5 cm, grade 2, margins negative, 1/3 lymph node positive ER 80-95 %, PR 80-95 %, HER-2 negative, Ki-67 2-20% right mastectomy: Benign T2 N1 a stage Ib  MammaPrint: Low risk Adjuvant radiation: 08/12/2019-09/25/2019  Treatment plan:Adjuvant antiestrogen therapy: We discussed tamoxifen for 10 years versus tamoxifen for 3 years followed by aromatase inhibitor for 5 years if she is menopausal  Tamoxifen Toxicities: Joint stiffness which may be related to tamoxifen.  Skin dryness. Fatigue: I instructed her to take B12 1000 mcg sublingual supplement. I discussed with her about switching the time of day that she takes tamoxifen.  Breast Cancer Surveillance: U/S: Left breast: Indeterminate mass left breast 0.9 cm, Biopsy 03/07/21: Benign  Abdominal pain, bone pain, pelvic pain, left epigastric pain: We will obtain CT chest abdomen pelvis and bone scan in a week.  I will call her with the result of the scans. Patient is extremely unhappy that the MRI done in the year before missed her breast cancer.  She had the scans reviewed by an external radiologist who suggested that the MRI did show an abnormality which was not followed up.  She feels very strongly that if only the MRI picked up the year  before she would not have to undergo such extensive surgeries and lymph node involvement. I explained to her that it is difficult to prove that scenario.  She had a chance to sit down with the radiologist and express her concerns.  We can see her once a year for follow-up if scans are negative.    Orders Placed This Encounter  Procedures  . CT CHEST ABDOMEN PELVIS W CONTRAST    Standing Status:   Future    Standing Expiration Date:   07/06/2021    Order Specific Question:   If indicated for the ordered procedure, I authorize the administration of contrast media per Radiology protocol    Answer:   Yes    Order Specific Question:   Is patient pregnant?    Answer:   No    Order Specific Question:   Preferred imaging location?    Answer:   [facility]    Order Specific Question:   Release to patient    Answer:   Immediate    Order Specific Question:   Is Oral Contrast requested for this exam?    Answer:  Yes, Per Radiology protocol    Order Specific Question:   Reason for Exam (SYMPTOM  OR DIAGNOSIS REQUIRED)    Answer:   Severe pain in bones and abdominal discomfort  . NM Bone Scan Whole Body    Standing Status:   Future    Standing Expiration Date:   07/06/2021    Order Specific Question:   If indicated for the ordered procedure, I authorize the administration of a radiopharmaceutical per Radiology protocol    Answer:   Yes    Order Specific Question:   Is the patient pregnant?    Answer:   No    Order Specific Question:   Preferred imaging location?    Answer:   [facility]    Order Specific Question:   Release to patient    Answer:   Immediate   The patient has a good understanding of the overall plan. she agrees with it. she will call with any problems that may develop before the next visit here.  Total time spent: 30 mins including face to face time and time spent for planning, charting and coordination of care  Rulon Eisenmenger, MD, MPH 07/06/2020  I, Cloyde Reams  Dorshimer, am acting as scribe for Dr. [name].  I have reviewed the above documentation for accuracy and completeness, and I agree with the above.

## 2020-07-06 ENCOUNTER — Other Ambulatory Visit: Payer: Self-pay

## 2020-07-06 ENCOUNTER — Inpatient Hospital Stay: Payer: BC Managed Care – PPO | Attending: Hematology and Oncology | Admitting: Hematology and Oncology

## 2020-07-06 ENCOUNTER — Encounter: Payer: Self-pay | Admitting: *Deleted

## 2020-07-06 DIAGNOSIS — Z79810 Long term (current) use of selective estrogen receptor modulators (SERMs): Secondary | ICD-10-CM | POA: Diagnosis not present

## 2020-07-06 DIAGNOSIS — Z170 Estrogen receptor positive status [ER+]: Secondary | ICD-10-CM | POA: Insufficient documentation

## 2020-07-06 DIAGNOSIS — Z9013 Acquired absence of bilateral breasts and nipples: Secondary | ICD-10-CM | POA: Diagnosis not present

## 2020-07-06 DIAGNOSIS — C50312 Malignant neoplasm of lower-inner quadrant of left female breast: Secondary | ICD-10-CM | POA: Insufficient documentation

## 2020-07-06 DIAGNOSIS — Z923 Personal history of irradiation: Secondary | ICD-10-CM | POA: Diagnosis not present

## 2020-07-06 NOTE — Progress Notes (Signed)
Met with patient briefly prior to her appt with Dr. Lindi Adie this afternoon to discuss the 12 months research appointment for the Upbeat Study.  Gave patient a written note with the information that this visit is due on 10/05/20 +/- 60 days and involves fasting labs, questionnaires, neurocognitive testing and physical functions testing. We would like to schedule it as close to 10/05/20 as possible. Asked patient to call research nurse with any dates/times she is available to schedule the appointment and gave her my phone number.  Patient verbalized understanding and agreed to call to schedule the visit. Thanked patient for her time and look forward to seeing her again in a few months.  Foye Spurling, BSN, RN Clinical Research Nurse 07/06/2020 3:39 PM

## 2020-07-07 ENCOUNTER — Encounter (INDEPENDENT_AMBULATORY_CARE_PROVIDER_SITE_OTHER): Payer: Self-pay

## 2020-07-07 ENCOUNTER — Other Ambulatory Visit: Payer: Self-pay | Admitting: *Deleted

## 2020-07-07 DIAGNOSIS — Z170 Estrogen receptor positive status [ER+]: Secondary | ICD-10-CM

## 2020-07-07 DIAGNOSIS — C50312 Malignant neoplasm of lower-inner quadrant of left female breast: Secondary | ICD-10-CM

## 2020-07-14 ENCOUNTER — Encounter (INDEPENDENT_AMBULATORY_CARE_PROVIDER_SITE_OTHER): Payer: Self-pay

## 2020-07-14 ENCOUNTER — Ambulatory Visit: Payer: BC Managed Care – PPO | Admitting: Hematology and Oncology

## 2020-07-17 ENCOUNTER — Other Ambulatory Visit: Payer: Self-pay

## 2020-07-17 ENCOUNTER — Ambulatory Visit (HOSPITAL_COMMUNITY)
Admission: RE | Admit: 2020-07-17 | Discharge: 2020-07-17 | Disposition: A | Discharge status: Home / Self Care | Payer: BC Managed Care – PPO | Source: Ambulatory Visit | Attending: Hematology and Oncology | Admitting: Hematology and Oncology

## 2020-07-17 DIAGNOSIS — Z170 Estrogen receptor positive status [ER+]: Secondary | ICD-10-CM | POA: Insufficient documentation

## 2020-07-17 DIAGNOSIS — C50312 Malignant neoplasm of lower-inner quadrant of left female breast: Secondary | ICD-10-CM | POA: Diagnosis not present

## 2020-07-17 MED ORDER — GADOBUTROL 1 MMOL/ML IV SOLN
6.0000 mL | Freq: Once | INTRAVENOUS | Status: AC | PRN
  Administered 2020-07-17: 6 mL via INTRAVENOUS

## 2020-07-21 ENCOUNTER — Encounter (INDEPENDENT_AMBULATORY_CARE_PROVIDER_SITE_OTHER): Payer: Self-pay

## 2020-07-22 ENCOUNTER — Ambulatory Visit (HOSPITAL_COMMUNITY)
Admission: RE | Admit: 2020-07-22 | Discharge: 2020-07-22 | Disposition: A | Discharge status: Home / Self Care | Payer: BC Managed Care – PPO | Source: Ambulatory Visit | Attending: Hematology and Oncology | Admitting: Hematology and Oncology

## 2020-07-22 ENCOUNTER — Encounter (HOSPITAL_COMMUNITY)
Admission: RE | Admit: 2020-07-22 | Discharge: 2020-07-22 | Disposition: A | Discharge status: Home / Self Care | Payer: BC Managed Care – PPO | Source: Ambulatory Visit | Attending: Hematology and Oncology | Admitting: Hematology and Oncology

## 2020-07-22 ENCOUNTER — Other Ambulatory Visit: Payer: Self-pay

## 2020-07-22 DIAGNOSIS — C50312 Malignant neoplasm of lower-inner quadrant of left female breast: Secondary | ICD-10-CM

## 2020-07-22 DIAGNOSIS — Z170 Estrogen receptor positive status [ER+]: Secondary | ICD-10-CM

## 2020-07-22 MED ORDER — TECHNETIUM TC 99M MEDRONATE IV KIT: 22.0000 | PACK | Freq: Once | INTRAVENOUS | Status: DC

## 2020-07-22 MED ORDER — IOHEXOL 300 MG/ML  SOLN
100.0000 mL | Freq: Once | INTRAMUSCULAR | Status: AC | PRN
  Administered 2020-07-22: 100 mL via INTRAVENOUS

## 2020-07-25 ENCOUNTER — Encounter: Payer: Self-pay | Admitting: *Deleted

## 2020-07-28 ENCOUNTER — Encounter (INDEPENDENT_AMBULATORY_CARE_PROVIDER_SITE_OTHER): Payer: Self-pay

## 2020-07-28 NOTE — Progress Notes (Signed)
Patient Care Team: [name], MD as PCP - General (Family Medicine) [name], MD as Attending Physician (Radiation Oncology) [name], MD as Consulting Physician (Hematology and Oncology) [name], MD as Consulting Physician (General Surgery)  DIAGNOSIS:    ICD-10-CM   1. Malignant neoplasm of lower-inner quadrant of left breast in female, estrogen receptor positive ([place])  C50.312    Z17.0     SUMMARY OF ONCOLOGIC HISTORY: Oncology History  Malignant neoplasm of lower-inner quadrant of left breast in female, estrogen receptor positive ([place])  03/13/2019 Initial Diagnosis   Screening detected left breast abnormalities, 0.6 cm at 8:30 position and 0.9 cm at 8 o'clock position with 1 abnormal left axillary lymph node.  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%, PR 90%, HER-2 -1+, Ki-67 20%; biopsy 8:30 position: ER 80%, PR 80%, Ki-67 5%, HER-2 negative; lymph node biopsy negative   03/31/2019 Cancer Staging   Staging form: Breast, AJCC 8th Edition - Clinical stage from 03/31/2019: Stage IA (cT1b, cN0, cM0, G2, ER+, PR+, HER2-)   05/14/2019 Surgery   Bilateral mastectomies Marlou Starks) [phone]):   Left mastectomy: Multifocal invasive lobular cancer largest focus 2.5 cm, grade 2, margins negative, 1/3 lymph node positive ER 80-95 %, PR 80-95 %, HER-2 negative, Ki-67 2-20%  Right mastectomy: Benign   05/21/2019 Cancer Staging   Staging form: Breast, AJCC 8th Edition - Pathologic stage from 05/21/2019: Stage IB (pT2, pN1a, cM0, G2, ER+, PR+, HER2-)   06/01/2019 Oncotype testing   Mammaprint: low risk   08/12/2019 - 09/25/2019 Radiation Therapy   The patient initially received a dose of 50.4 Gy in 28 fractions to the breast using whole-breast tangent fields. She also received 50.4 Gy in 28 fractions to the left supraclavicular region. This was delivered using a 3-D conformal technique. The pt received a boost delivering an additional 10  Gy in 5 fractions using a electron boost with 24mV electrons. The total dose was 60.4 Gy.   09/2019 - 09/2029 Anti-estrogen oral therapy   Tamoxifen     CHIEF COMPLIANT: Follow-up of left breast cancer on tamoxifen  INTERVAL HISTORY: Alice Walsworthis a 50y.o. with above-mentioned history of left breast cancerwhounderwent a left mastectomy with reconstruction, radiation, and is currently on antiestrogen therapy with tamoxifen. Brain MRI on 07/17/20 showed no evidence of malignancy. CT CAP on 07/22/20 showed no definitive evidence of metastatic disease. Bone scan on 07/22/20 showed no evidence of osseous metastatic disease. She presents to the clinic today for follow-up to review her scans.   ALLERGIES:  is allergic to latex and doxycycline.  MEDICATIONS:  Current Outpatient Medications  Medication Sig Dispense Refill  . cetirizine (ZYRTEC) 10 MG tablet Take 10 mg by mouth daily.     .Marland KitchenELDERBERRY PO Take 15 mLs by mouth daily.    .Marland Kitchenerythromycin with ethanol (EMGEL) 2 % gel Apply topically daily. 30 g 0  . Multiple Vitamins-Minerals (MULTIVITAMIN ADULT EXTRA C PO) Take 1 tablet by mouth daily.     . tamoxifen (NOLVADEX) 20 MG tablet Take 1 tablet (20 mg total) by mouth daily. 7 tablet 52   No current facility-administered medications for this visit.    PHYSICAL EXAMINATION: ECOG PERFORMANCE STATUS: 1 - Symptomatic but completely ambulatory  Vitals:   07/29/20 0826  BP: (!) 109/54  Pulse: 93  Resp: 18  Temp: 98.8 F (37.1 C)  SpO2: 100%   Filed Weights   07/29/20 0826  Weight: 135  lb 9.6 oz (61.5 kg)    LABORATORY DATA:  I have reviewed the data as listed No flowsheet data found.  Lab Results  Component Value Date   WBC 6.5 05/11/2019   HGB 14.1 05/11/2019   HCT 44.1 05/11/2019   MCV 93.8 05/11/2019   PLT 279 05/11/2019    ASSESSMENT & PLAN:  Malignant neoplasm of lower-inner quadrant of left breast in female, estrogen receptor positive ([place]) 03/13/2019:Screening  detected left breast abnormalities, 0.6 cm at 8:30 position and 0.9 cm at 8 o'clock position with 1 abnormal left axillary lymph node. 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 ER 90%, PR 90%, HER-2 -1+, Ki-67 20%; biopsy 8:30 position: ER 80%, PR 80%, Ki-67 5%, HER-2 negative; lymph node biopsy negative T1 BN 0 stage Ia clinical stage  Bilateral mastectomies: Left mastectomy: Multifocal invasive lobular cancer largest focus 2.5 cm, grade 2, margins negative, 1/3 lymph node positive ER 80-95 %, PR 80-95 %, HER-2 negative, Ki-67 2-20% right mastectomy: Benign T2 N1 a stage Ib  MammaPrint: Low risk Adjuvant radiation: 08/12/2019-09/25/2019  Treatment plan:Adjuvant antiestrogen therapy: We discussed tamoxifen for 10 years versus tamoxifen for 3 years followed by aromatase inhibitor for 5 years if she is menopausal  Tamoxifen Toxicities: Joint stiffness which may be related to tamoxifen.  Skin dryness. Fatigue: I instructed her to take B12 1000 mcg sublingual supplement. I discussed with her about switching the time of day that she takes tamoxifen.  Breast Cancer Surveillance: U/S: Left breast: Indeterminate mass left breast 0.9 cm, Biopsy 03/07/20: Benign  Abdominal pain, bone pain, pelvic pain, left epigastric pain:   07/17/20: MRI Brain: Neg 07/23/20: CT CAP: No def evidence of Mets, small sclerotic bone islands 07/24/20: Bone scan: No bone mets  She is under a lot of stress since her husband left her a week ago.  Her son accompanied her to the clinic visit today. RTC in 1 year  No orders of the defined types were placed in this encounter.  The patient has a good understanding of the overall plan. she agrees with it. she will call with any problems that may develop before the next visit here.  Total time spent: 20 mins including face to face time and time spent for planning, charting and coordination of care  Rulon Eisenmenger, MD,  MPH 07/29/2020  I, Molly Dorshimer, am acting as scribe for Dr. [name].  I have reviewed the above documentation for accuracy and completeness, and I agree with the above.

## 2020-07-28 NOTE — Assessment & Plan Note (Signed)
03/13/2019:Screening detected left breast abnormalities, 0.6 cm at 8:30 position and 0.9 cm at 8 o'clock position with 1 abnormal left axillary lymph node. 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 ER 90%, PR 90%, HER-2 -1+, Ki-67 20%; biopsy 8:30 position: ER 80%, PR 80%, Ki-67 5%, HER-2 negative; lymph node biopsy negative T1 BN 0 stage Ia clinical stage  Bilateral mastectomies: Left mastectomy: Multifocal invasive lobular cancer largest focus 2.5 cm, grade 2, margins negative, 1/3 lymph node positive ER 80-95 %, PR 80-95 %, HER-2 negative, Ki-67 2-20% right mastectomy: Benign T2 N1 a stage Ib  MammaPrint: Low risk Adjuvant radiation: 08/12/2019-09/25/2019  Treatment plan:Adjuvant antiestrogen therapy: We discussed tamoxifen for 10 years versus tamoxifen for 3 years followed by aromatase inhibitor for 5 years if she is menopausal  Tamoxifen Toxicities: Joint stiffness which may be related to tamoxifen.  Skin dryness. Fatigue: I instructed her to take B12 1000 mcg sublingual supplement. I discussed with her about switching the time of day that she takes tamoxifen.  Breast Cancer Surveillance: U/S: Left breast: Indeterminate mass left breast 0.9 cm, Biopsy 03/07/21: Benign  Abdominal pain, bone pain, pelvic pain, left epigastric pain:   07/17/20: MRI Brain: Neg 07/23/20: CT CAP: No def evidence of Mets, small sclerotic bone islands 07/24/20: Bone scan: No bone mets

## 2020-07-29 ENCOUNTER — Telehealth: Payer: Self-pay | Admitting: *Deleted

## 2020-07-29 ENCOUNTER — Inpatient Hospital Stay (HOSPITAL_BASED_OUTPATIENT_CLINIC_OR_DEPARTMENT_OTHER): Payer: BC Managed Care – PPO | Admitting: Hematology and Oncology

## 2020-07-29 ENCOUNTER — Other Ambulatory Visit: Payer: Self-pay

## 2020-07-29 ENCOUNTER — Inpatient Hospital Stay: Payer: BC Managed Care – PPO | Admitting: Hematology and Oncology

## 2020-07-29 DIAGNOSIS — C50312 Malignant neoplasm of lower-inner quadrant of left female breast: Secondary | ICD-10-CM | POA: Diagnosis not present

## 2020-07-29 DIAGNOSIS — Z170 Estrogen receptor positive status [ER+]: Secondary | ICD-10-CM | POA: Diagnosis not present

## 2020-07-29 NOTE — Telephone Encounter (Signed)
Scheduled appt per sch msg. Patient is already aware of date and time

## 2020-07-29 NOTE — Telephone Encounter (Signed)
Upbeat Study; Patient called to schedule her 12 months study visit. She agreed to come in on Friday June 17th at 8 am for lab to be followed by research appointment.  Informed patient to fast for at least 3 hours prior to lab and research nurse will call patient to remind again prior to appointment. Thanked patient for calling and look forward to seeing her in June. Foye Spurling, BSN, RN Clinical Research Nurse 07/29/2020 3:04 PM

## 2020-08-03 ENCOUNTER — Other Ambulatory Visit: Payer: Self-pay | Admitting: *Deleted

## 2020-08-03 DIAGNOSIS — Z006 Encounter for examination for normal comparison and control in clinical research program: Secondary | ICD-10-CM

## 2020-08-04 ENCOUNTER — Encounter (INDEPENDENT_AMBULATORY_CARE_PROVIDER_SITE_OTHER): Payer: Self-pay

## 2020-08-11 ENCOUNTER — Encounter (INDEPENDENT_AMBULATORY_CARE_PROVIDER_SITE_OTHER): Payer: Self-pay

## 2020-08-18 ENCOUNTER — Encounter (INDEPENDENT_AMBULATORY_CARE_PROVIDER_SITE_OTHER): Payer: Self-pay

## 2020-08-25 ENCOUNTER — Encounter (INDEPENDENT_AMBULATORY_CARE_PROVIDER_SITE_OTHER): Payer: Self-pay

## 2020-08-29 ENCOUNTER — Ambulatory Visit: Payer: BC Managed Care – PPO | Attending: Radiation Oncology

## 2020-08-29 ENCOUNTER — Other Ambulatory Visit: Payer: Self-pay

## 2020-08-29 DIAGNOSIS — Z483 Aftercare following surgery for neoplasm: Secondary | ICD-10-CM | POA: Insufficient documentation

## 2020-08-29 NOTE — Therapy (Signed)
[place], [place], [address] Phone: [phone]   Fax:  [phone]  Physical Therapy Treatment  Patient Details  Name: Alice Davis MRN: [id number] Date of Birth: 1971/03/11 Referring Provider (PT): Reita May Date: 08/29/2020   PT End of Session - 08/29/20 1652    Visit Number 17   screen only   PT Start Time 7902    PT Stop Time 1652    PT Time Calculation (min) 8 min    Activity Tolerance Patient tolerated treatment well    Behavior During Therapy [facility] for tasks assessed/performed           Past Medical History:  Diagnosis Date  . Asthma   . Cancer [facility])     Past Surgical History:  Procedure Laterality Date  . BREAST RECONSTRUCTION WITH PLACEMENT OF TISSUE EXPANDER AND FLEX HD (ACELLULAR HYDRATED DERMIS) Bilateral 05/14/2019   Procedure: IMMEDIATE BREAST RECONSTRUCTION WITH PLACEMENT OF TISSUE EXPANDER AND FLEX HD (ACELLULAR HYDRATED DERMIS);  Surgeon: Wallace Going, DO;  Location: [facility];  Service: Plastics;  Laterality: Bilateral;  . NASAL SINUS SURGERY     20 years ago  . NIPPLE SPARING MASTECTOMY WITH SENTINEL LYMPH NODE BIOPSY Bilateral 05/14/2019   Procedure: BILATERAL NIPPLE SPARING MASTECTOMIES WITH LEFT SENTINEL LYMPH NODE BIOPSY;  Surgeon: [name], MD;  Location: [facility] [place];  Service: General;  Laterality: Bilateral;  PEC  . REMOVAL OF BILATERAL TISSUE EXPANDERS WITH PLACEMENT OF BILATERAL BREAST IMPLANTS Bilateral 07/13/2019   Procedure: REMOVAL OF BILATERAL TISSUE EXPANDERS WITH PLACEMENT OF BILATERAL BREAST IMPLANTS;  Surgeon: Wallace Going, DO;  Location: [place];  Service: Plastics;  Laterality: Bilateral;  . RHINOPLASTY     20 years  . WISDOM TOOTH EXTRACTION      There were no vitals filed for this visit.   Subjective Assessment - 08/29/20 1648    Subjective Pt returns for her 3 month L-Dex screen.    Pertinent History January 7th  Bil Masectomy with 3 lymph node removal,Left mastectomy: Multifocal invasive lobular cancer largest focus 2.5 cm, grade 2, margins negative, 1/3 lymph node positive ER 80-95 %, PR 80-95 %, HER-2 negative, Ki-67 2-20%right mastectomy: Benign; then bil implant exchange July 13, 2019, pt completed radiation 09/25/19, pt currently tamoxifen                  L-DEX FLOWSHEETS - 08/29/20 1600      L-DEX LYMPHEDEMA SCREENING   Measurement Type Unilateral    L-DEX MEASUREMENT EXTREMITY Upper Extremity    POSITION  Standing    DOMINANT SIDE Right    At Risk Side Left    BASELINE SCORE (UNILATERAL) 0.8    L-DEX SCORE (UNILATERAL) 2.8    VALUE CHANGE (UNILAT) 2                                  PT Long Term Goals - 04/12/20 1719      PT LONG TERM GOAL #1   Title Pt will demonstrate 160 degrees of left shoulder flexion to allow pt to reach overhead    Baseline 152, 168 on 11/24/2019, 148 on 01/19/20 back against wall    Status On-going      PT LONG TERM GOAL #2   Title Pt will demonstratse 160 degrees of left shoulder abduction to allow pt to reach out to the sides  Baseline 147, 175 on 11/24/2019, 149 on 01/19/20    Status On-going      PT LONG TERM GOAL #3   Title Pt will report a 75% improvement in tightness and discomfort in left lateral trunk with end range shoulder ROM for improved comfort.    Status Achieved      PT LONG TERM GOAL #4   Title Pt will be independent in a home exercise program for continued strengthening and stretching    Status On-going      PT LONG TERM GOAL #5   Title Pt will report no pull in the left UE with AROM due to cording    Time 8    Period Weeks    Status New                 Plan - 08/29/20 1653    Clinical Impression Statement Pt returns for her 3 month L-Dex screen. Her change from baseline of 2 is WNLs so no further treatment is required at this time except to cont every 3 month L-Dex screen which pt is  agreeable to.    PT Next Visit Plan Cont every 3 month L-Dex screens for up tp 2 years from her SLNB which will be around 05/13/2021.    Consulted and Agree with Plan of Care Patient           Patient will benefit from skilled therapeutic intervention in order to improve the following deficits and impairments:     Visit Diagnosis: Aftercare following surgery for neoplasm     Problem List Patient Active Problem List   Diagnosis Date Noted  . S/P breast reconstruction, bilateral 07/21/2019  . Acquired absence of breast 05/22/2019  . Breast cancer ([place]) 05/14/2019  . Malignant neoplasm of lower-inner quadrant of left breast in female, estrogen receptor positive ([place]) 03/24/2019  . Allergic dermatitis 12/27/2017  . Reactive airway disease without complication 16/02/9603  . Allergic rhinitis 11/28/2016  . Hyperlipidemia 11/28/2016    Otelia Limes, PTA 08/29/2020, 5:05 PM  [place], [place], [address] Phone: [phone]   Fax:  [phone]  Name: Cade Olberding MRN: [id number] Date of Birth: 12-Apr-1971

## 2020-09-01 ENCOUNTER — Encounter (INDEPENDENT_AMBULATORY_CARE_PROVIDER_SITE_OTHER): Payer: Self-pay

## 2020-09-08 ENCOUNTER — Encounter (INDEPENDENT_AMBULATORY_CARE_PROVIDER_SITE_OTHER): Payer: Self-pay

## 2020-09-15 ENCOUNTER — Encounter (INDEPENDENT_AMBULATORY_CARE_PROVIDER_SITE_OTHER): Payer: Self-pay

## 2020-09-16 ENCOUNTER — Telehealth: Payer: Self-pay | Admitting: *Deleted

## 2020-09-16 ENCOUNTER — Ambulatory Visit: Payer: BC Managed Care – PPO | Admitting: Plastic Surgery

## 2020-09-16 NOTE — Telephone Encounter (Signed)
Upbeat Study:  Patient called to reschedule her Upbeat study visit from 10/21/20 to 10/26/20 at 3:30 pm.  Reminded patient to fast for at least 3 hrs prior to lab at 3:30 pm.  Research staff will also call patient prior to visit to remind to fast. Thanked patient for calling and scheduling message sent to reschedule as discussed.  Foye Spurling, BSN, RN Clinical Research Nurse 09/16/2020 9:59 AM

## 2020-09-22 ENCOUNTER — Encounter (INDEPENDENT_AMBULATORY_CARE_PROVIDER_SITE_OTHER): Payer: Self-pay

## 2020-09-23 ENCOUNTER — Ambulatory Visit: Payer: BC Managed Care – PPO | Admitting: Plastic Surgery

## 2020-09-29 ENCOUNTER — Encounter (INDEPENDENT_AMBULATORY_CARE_PROVIDER_SITE_OTHER): Payer: Self-pay

## 2020-09-30 ENCOUNTER — Ambulatory Visit: Payer: BC Managed Care – PPO | Admitting: Plastic Surgery

## 2020-09-30 ENCOUNTER — Telehealth: Payer: Self-pay | Admitting: Hematology and Oncology

## 2020-09-30 NOTE — Telephone Encounter (Signed)
Scheduled appointment per 05/27 sch msg. Patient Is aware.

## 2020-10-21 ENCOUNTER — Encounter: Payer: BC Managed Care – PPO | Admitting: *Deleted

## 2020-10-21 ENCOUNTER — Other Ambulatory Visit: Payer: BC Managed Care – PPO

## 2020-10-25 ENCOUNTER — Telehealth: Payer: Self-pay | Admitting: *Deleted

## 2020-10-25 NOTE — Telephone Encounter (Signed)
Upbeat Study; Patient confirmed her research appointment for tomorrow at 3:30 pm and to fast for at least 3 hrs prior to lab at 3:30 pm.  Foye Spurling, BSN, RN Clinical Research Nurse 10/25/2020 9:38 AM

## 2020-10-26 ENCOUNTER — Inpatient Hospital Stay: Payer: BC Managed Care – PPO | Admitting: *Deleted

## 2020-10-26 ENCOUNTER — Inpatient Hospital Stay: Payer: BC Managed Care – PPO | Attending: Hematology and Oncology

## 2020-10-26 ENCOUNTER — Other Ambulatory Visit: Payer: Self-pay

## 2020-10-26 VITALS — BP 97/59 | HR 82 | Ht 67.0 in | Wt 125.2 lb

## 2020-10-26 DIAGNOSIS — Z170 Estrogen receptor positive status [ER+]: Secondary | ICD-10-CM

## 2020-10-26 DIAGNOSIS — Z006 Encounter for examination for normal comparison and control in clinical research program: Secondary | ICD-10-CM

## 2020-10-26 NOTE — Research (Signed)
[id number] UPBEAT STUDY 12 Months Assessments:  Patient in clinic by herself this afternoon to complete the activities for the Upbeat study.  PROs; Given to patient to complete in clinic. Research nurse collected and checked for completion and accuracy at the end of the visit.  ULAGT36 Questionnaire; Patient answered questions in clinic.  Cardiac MRI; Not required at this visit.  Lab; Research specimens collected per protocol.  Patient confirmed she had been fasting for at least 3 hours prior to blood collection.  VS; Collected per protocol. See VS flowsheet. Waist Measurement; 32 inches.  Concomitant Meds; Current medication list reviewed with patient and medication. She reports she started taking a vitamin D and B12 supplement but is unsure of the dose. She denies any other changes to medication list and confirms she continues to take Tamoxifen daily as prescribed.  Physical Functions Testing; Completed without difficulty.  Neurocognitive Testing; Administered by Clabe Seal, CRC.   Cardiovascular Events; Patient denies any hospitalizations or cardiovascular events since last visit.  Gift Card; [phone] [employer] gift card given to patient for completing the 12 months activities on this study.  Consent Addendum; ICF Addendum Protocol Version Date 08/12/20 read to patient in it's entirety. Patient denied any questions. She agreed to complete the new questionnaire at 24 months visit but did not agree to complete questionnaires online. Patient prefers to continue to complete questionnaires in clinic. Copy of signed form given to patient for her records. Plan; 24 months visit is due in one year.  Informed patient research nurse will call her to schedule next Spring. Encouraged patient to call research nurse if any questions prior to next call. Thanked patient for her participation in this study. She verbalized understanding. Foye Spurling, BSN, RN Clinical Research Nurse 10/26/2020

## 2020-10-27 LAB — RESEARCH LABS

## 2020-10-31 ENCOUNTER — Other Ambulatory Visit: Payer: Self-pay | Admitting: *Deleted

## 2020-10-31 DIAGNOSIS — C50312 Malignant neoplasm of lower-inner quadrant of left female breast: Secondary | ICD-10-CM

## 2020-11-01 ENCOUNTER — Telehealth: Payer: Self-pay | Admitting: *Deleted

## 2020-11-01 NOTE — Telephone Encounter (Signed)
Upbeat Study: LVM for patient informing her study lab results are good and I am mailing a copy to her home address. Also asked her to confirm her e-mail address as I was trying to send her information about the breast cancer support group she requested but I did not have the correct e-mail address. I included a flyer about our support groups in the mailing.  Foye Spurling, BSN, RN Clinical Research Nurse 11/01/2020 10:59 AM

## 2020-11-10 ENCOUNTER — Telehealth: Payer: Self-pay | Admitting: *Deleted

## 2020-11-10 NOTE — Telephone Encounter (Signed)
Patient requests to give something to Dr. Lindi Adie. This nurse agreed to meet patient outside tomorrow morning to receive it and deliver to Dr. Lindi Adie. Foye Spurling, BSN, RN Clinical Research Nurse 11/10/2020 9:44 AM

## 2020-11-11 ENCOUNTER — Telehealth: Payer: Self-pay | Admitting: *Deleted

## 2020-11-11 NOTE — Telephone Encounter (Signed)
Met patient out front of [facility] this morning and received a sealed envelope addressed to Dr. Lindi Adie. Patient requested it be delivered directly to Dr. Lindi Adie and to please let her know that it has been delivered. Envelope given to Dr. Lindi Adie and I left message for patient informing that it was delivered.  Foye Spurling, BSN, RN Clinical Research Nurse 11/11/2020 8:35 AM

## 2020-11-28 ENCOUNTER — Other Ambulatory Visit: Payer: Self-pay | Admitting: *Deleted

## 2020-11-28 ENCOUNTER — Ambulatory Visit: Payer: BC Managed Care – PPO | Attending: Radiation Oncology

## 2020-11-28 ENCOUNTER — Other Ambulatory Visit: Payer: Self-pay

## 2020-11-28 VITALS — Wt 125.5 lb

## 2020-11-28 DIAGNOSIS — Z483 Aftercare following surgery for neoplasm: Secondary | ICD-10-CM | POA: Insufficient documentation

## 2020-11-28 MED ORDER — TAMOXIFEN CITRATE 20 MG PO TABS: 20.0000 mg | ORAL_TABLET | Freq: Every day | ORAL | 52 refills | Status: DC

## 2020-11-28 NOTE — Therapy (Signed)
[facility] [address] [place], [place], [address] Phone: [phone]   Fax:  [phone]  Physical Therapy Treatment  Patient Details  Name: Alice Davis MRN: [id number] Date of Birth: 08/31/1970 No data recorded  Encounter Date: 11/28/2020   PT End of Session - 11/28/20 1703     Visit Number 17   # unchanged due to screen only   PT Start Time 1651    PT Stop Time 1703    PT Time Calculation (min) 12 min    Activity Tolerance Patient tolerated treatment well    Behavior During Therapy WFL for tasks assessed/performed             Past Medical History:  Diagnosis Date   Asthma    Cancer (HCC)     Past Surgical History:  Procedure Laterality Date   BREAST RECONSTRUCTION WITH PLACEMENT OF TISSUE EXPANDER AND FLEX HD (ACELLULAR HYDRATED DERMIS) Bilateral 05/14/2019   Procedure: IMMEDIATE BREAST RECONSTRUCTION WITH PLACEMENT OF TISSUE EXPANDER AND FLEX HD (ACELLULAR HYDRATED DERMIS);  Surgeon: Dillingham, Claire S, DO;  Location: [facility];  Service: Plastics;  Laterality: Bilateral;   NASAL SINUS SURGERY     20 years ago   NIPPLE SPARING MASTECTOMY WITH SENTINEL LYMPH NODE BIOPSY Bilateral 05/14/2019   Procedure: BILATERAL NIPPLE SPARING MASTECTOMIES WITH LEFT SENTINEL LYMPH NODE BIOPSY;  Surgeon: Toth, Paul III, MD;  Location: [facility];  Service: General;  Laterality: Bilateral;  PEC   REMOVAL OF BILATERAL TISSUE EXPANDERS WITH PLACEMENT OF BILATERAL BREAST IMPLANTS Bilateral 07/13/2019   Procedure: REMOVAL OF BILATERAL TISSUE EXPANDERS WITH PLACEMENT OF BILATERAL BREAST IMPLANTS;  Surgeon: Dillingham, Claire S, DO;  Location: [facility];  Service: Plastics;  Laterality: Bilateral;   RHINOPLASTY     20 years   WISDOM TOOTH EXTRACTION      Vitals:   11/28/20 1659  Weight: 125 lb 8 oz (56.9 kg)     Subjective Assessment - 11/28/20 1659     Subjective Pt returns for her 3 month L-Dex screen.    Pertinent  History January 7th Bil Masectomy with 3 lymph node removal,Left mastectomy: Multifocal invasive lobular cancer largest focus 2.5 cm, grade 2, margins negative, 1/3 lymph node positive ER 80-95 %, PR 80-95 %, HER-2 negative, Ki-67 2-20%right mastectomy: Benign; then bil implant exchange July 13, 2019, pt completed radiation 09/25/19, pt currently tamoxifen                    L-DEX FLOWSHEETS - 11/28/20 1600       L-DEX LYMPHEDEMA SCREENING   Measurement Type Unilateral    L-DEX MEASUREMENT EXTREMITY Upper Extremity    POSITION  Standing    DOMINANT SIDE Right    At Risk Side Left    BASELINE SCORE (UNILATERAL) 0.8    L-DEX SCORE (UNILATERAL) 1.9    VALUE CHANGE (UNILAT) 1.1                                    PT Long Term Goals - 04/12/20 1719       PT LONG TERM GOAL #1   Title Pt will demonstrate 160 degrees of left shoulder flexion to allow pt to reach overhead    Baseline 152, 168 on 11/24/2019, 148 on 01/19/20 back against wall    Status On-going      PT LONG TERM GOAL #2   Title Pt   will demonstratse 160 degrees of left shoulder abduction to allow pt to reach out to the sides    Baseline 147, 175 on 11/24/2019, 149 on 01/19/20    Status On-going      PT LONG TERM GOAL #3   Title Pt will report a 75% improvement in tightness and discomfort in left lateral trunk with end range shoulder ROM for improved comfort.    Status Achieved      PT LONG TERM GOAL #4   Title Pt will be independent in a home exercise program for continued strengthening and stretching    Status On-going      PT LONG TERM GOAL #5   Title Pt will report no pull in the left UE with AROM due to cording    Time 8    Period Weeks    Status New                   Plan - 11/28/20 1703     Clinical Impression Statement Pt returns for her 3 month L-Dex screen. Her change from baseline of 1.1 is WNLs so no further treament regarding this is required at this time. She  does report her Lt pectoralis tightness has yet to resolve so she is returning to physical therapy next week for this and would like to see if dry needling is an option as well.    PT Next Visit Plan Cont every 3 month L-Dex screens for up tp 2 years from her SLNB which will be around 05/13/2021; assess Lt pectoralis tightness next, dry needling??    Consulted and Agree with Plan of Care Patient             Patient will benefit from skilled therapeutic intervention in order to improve the following deficits and impairments:     Visit Diagnosis: Aftercare following surgery for neoplasm     Problem List Patient Active Problem List   Diagnosis Date Noted   S/P breast reconstruction, bilateral 07/21/2019   Acquired absence of breast 05/22/2019   Breast cancer (HCC) 05/14/2019   Malignant neoplasm of lower-inner quadrant of left breast in female, estrogen receptor positive (HCC) 03/24/2019   Allergic dermatitis 12/27/2017   Reactive airway disease without complication 12/13/2016   Allergic rhinitis 11/28/2016   Hyperlipidemia 11/28/2016    Rosenberger, Valerie Ann, PTA 11/28/2020, 5:06 PM  [facility] [address] [place], , [address] Phone: [phone]   Fax:  [phone]  Name: Alice Davis MRN: [id number] Date of Birth: 05/16/1970    

## 2020-11-29 ENCOUNTER — Other Ambulatory Visit: Payer: Self-pay

## 2020-11-29 ENCOUNTER — Encounter: Payer: Self-pay | Admitting: Plastic Surgery

## 2020-11-29 ENCOUNTER — Ambulatory Visit (INDEPENDENT_AMBULATORY_CARE_PROVIDER_SITE_OTHER): Payer: BC Managed Care – PPO | Admitting: Plastic Surgery

## 2020-11-29 DIAGNOSIS — Z9889 Other specified postprocedural states: Secondary | ICD-10-CM

## 2020-11-29 DIAGNOSIS — C50312 Malignant neoplasm of lower-inner quadrant of left female breast: Secondary | ICD-10-CM | POA: Diagnosis not present

## 2020-11-29 DIAGNOSIS — Z170 Estrogen receptor positive status [ER+]: Secondary | ICD-10-CM

## 2020-11-29 NOTE — Progress Notes (Addendum)
   Subjective:    Patient ID: Alice Davis, female    DOB: 04-Dec-1970, 50 y.o.   MRN: [id number]  The patient is a 50 year old female here for follow-up on her bilateral breast reconstruction.  She is doing extremely well and has completely finished her radiation.  She has [place] round high-profile extra gel 415 cc implants in place on both sides.  Her left side was radiated.  She had an area of concern that would biopsy several months ago and was benign.  I feel her capsule and implants.  The the left is slightly firmer than the right.  But the feel like they are intact and doing very well.  Her incisions are all healed very nicely and no signs of hematoma or seroma.   Review of Systems  Constitutional: Negative.   HENT: Negative.    Eyes: Negative.   Respiratory: Negative.    Cardiovascular: Negative.   Gastrointestinal: Negative.   Endocrine: Negative.   Genitourinary: Negative.   Hematological: Negative.   Psychiatric/Behavioral: Negative.        Objective:   Physical Exam Vitals and nursing note reviewed.  Constitutional:      Appearance: Normal appearance.  HENT:     Head: Normocephalic and atraumatic.  Cardiovascular:     Rate and Rhythm: Normal rate.     Pulses: Normal pulses.  Pulmonary:     Effort: Pulmonary effort is normal.  Abdominal:     General: Abdomen is flat. There is no distension.     Palpations: There is no mass.  Skin:    General: Skin is warm.     Capillary Refill: Capillary refill takes less than 2 seconds.     Coloration: Skin is not jaundiced.     Findings: No bruising.  Neurological:     General: No focal deficit present.     Mental Status: She is alert and oriented to person, place, and time.  Psychiatric:        Mood and Affect: Mood normal.        Behavior: Behavior normal.       Assessment & Plan:     ICD-10-CM   1. S/P breast reconstruction, bilateral  Z98.890     2. Malignant neoplasm of lower-inner quadrant of left breast in  female, estrogen receptor positive ([place])  C50.312    Z17.0       Continue with massage to the area follow up 1 year.  We talked about options if needed for fat grafting or repositioning of the right implant.  She is quite pleased right now.  She shared to very sad news of the break-up of her marriage.  She seems to be handling it really well and is an amazing woman.  Pictures were obtained of the patient and placed in the chart with the patient's or guardian's permission.  We discussed U/S of implants if there is ever a concern.

## 2020-12-01 ENCOUNTER — Telehealth: Payer: Self-pay | Admitting: Plastic Surgery

## 2020-12-01 NOTE — Telephone Encounter (Signed)
Patient called to request an order for a breast ultrasound in order to create a baseline. She has met her out of pocket for the year and would like to go ahead and get the baseline imaging done soon.

## 2020-12-07 ENCOUNTER — Other Ambulatory Visit: Payer: Self-pay

## 2020-12-07 ENCOUNTER — Ambulatory Visit: Payer: BC Managed Care – PPO | Attending: Radiation Oncology | Admitting: Physical Therapy

## 2020-12-07 ENCOUNTER — Encounter: Payer: Self-pay | Admitting: Physical Therapy

## 2020-12-07 DIAGNOSIS — Z483 Aftercare following surgery for neoplasm: Secondary | ICD-10-CM | POA: Diagnosis present

## 2020-12-07 DIAGNOSIS — R293 Abnormal posture: Secondary | ICD-10-CM | POA: Diagnosis present

## 2020-12-07 DIAGNOSIS — M25612 Stiffness of left shoulder, not elsewhere classified: Secondary | ICD-10-CM | POA: Diagnosis present

## 2020-12-07 DIAGNOSIS — C50312 Malignant neoplasm of lower-inner quadrant of left female breast: Secondary | ICD-10-CM | POA: Diagnosis present

## 2020-12-07 DIAGNOSIS — L599 Disorder of the skin and subcutaneous tissue related to radiation, unspecified: Secondary | ICD-10-CM | POA: Diagnosis present

## 2020-12-07 DIAGNOSIS — Z170 Estrogen receptor positive status [ER+]: Secondary | ICD-10-CM | POA: Diagnosis present

## 2020-12-07 NOTE — Therapy (Signed)
[place], [place], [address] Phone: [phone]   Fax:  [phone]  Physical Therapy Evaluation  Patient Details  Name: Alice Davis MRN: [id number] Date of Birth: Jun 21, 1970 Referring Provider (PT): Joaquin Courts Date: 12/07/2020   PT End of Session - 12/07/20 1657     Visit Number 1    Number of Visits 9    Date for PT Re-Evaluation 01/04/21    PT Start Time 1604    PT Stop Time 1654    PT Time Calculation (min) 50 min    Activity Tolerance Patient tolerated treatment well    Behavior During Therapy [facility] for tasks assessed/performed             Past Medical History:  Diagnosis Date   Asthma    Cancer ([place])     Past Surgical History:  Procedure Laterality Date   BREAST RECONSTRUCTION WITH PLACEMENT OF TISSUE EXPANDER AND FLEX HD (ACELLULAR HYDRATED DERMIS) Bilateral 05/14/2019   Procedure: IMMEDIATE BREAST RECONSTRUCTION WITH PLACEMENT OF TISSUE EXPANDER AND FLEX HD (ACELLULAR HYDRATED DERMIS);  Surgeon: Wallace Going, DO;  Location: [facility];  Service: Plastics;  Laterality: Bilateral;   NASAL SINUS SURGERY     20 years ago   NIPPLE SPARING MASTECTOMY WITH SENTINEL LYMPH NODE BIOPSY Bilateral 05/14/2019   Procedure: BILATERAL NIPPLE SPARING MASTECTOMIES WITH LEFT SENTINEL LYMPH NODE BIOPSY;  Surgeon: [name], MD;  Location: [facility];  Service: General;  Laterality: Bilateral;  PEC   REMOVAL OF BILATERAL TISSUE EXPANDERS WITH PLACEMENT OF BILATERAL BREAST IMPLANTS Bilateral 07/13/2019   Procedure: REMOVAL OF BILATERAL TISSUE EXPANDERS WITH PLACEMENT OF BILATERAL BREAST IMPLANTS;  Surgeon: Wallace Going, DO;  Location: [facility];  Service: Plastics;  Laterality: Bilateral;   RHINOPLASTY     20 years   WISDOM TOOTH EXTRACTION      There were no vitals filed for this visit.    Subjective Assessment - 12/07/20 1605     Subjective I am having tightness  across my left chest and limited ROM. My surgeon thought I should come back.    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 No/denies    Pain Score 0-No pain                [facility] PT Assessment - 12/07/20 0001       Assessment   Medical Diagnosis left breast cancer    Referring Provider (PT) Marlou Starks    Onset Date/Surgical Date 05/14/19    Hand Dominance Right    Prior Therapy pt received PT at this clinic in June 2021 for ROM      Precautions   Precautions Other (comment)    Precaution Comments at risk for lymphedema      Restrictions   Weight Bearing Restrictions No      Balance Screen   Has the patient fallen in the past 6 months No    Has the patient had a decrease in activity level because of a fear of falling?  No    Is the patient reluctant  to leave their home because of a fear of falling?  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    Leisure tries to walk a couple miles 3x/wk      [employer]   Overall Cognitive Status Within Functional Limits for tasks assessed      Posture/Postural Control   Posture/Postural Control Postural limitations    Postural Limitations Rounded Shoulders;Forward head      AROM   Right Shoulder Flexion --    Right Shoulder ABduction --    Right Shoulder  Internal Rotation --    Right Shoulder External Rotation --    Left Shoulder Flexion 144 Degrees    Left Shoulder ABduction 128 Degrees    Left Shoulder Internal Rotation --    Left Shoulder External Rotation --                        Objective measurements completed on examination: See above findings.       [facility] Adult PT Treatment/Exercise - 12/07/20 0001       Manual Therapy   Myofascial Release to cording in L axilla    Passive ROM of the left shoulder into flexion, abduction, and D2 at various angles with incorporation of pinning at the axilla and various locations along cording                         PT Long Term Goals - 12/07/20 1702       PT LONG TERM GOAL #1   Title Pt will demonstrate 160 degrees of left shoulder flexion to allow pt to reach overhead    Baseline 144    Time 4    Period Weeks    Status New    Target Date 01/04/21      PT LONG TERM GOAL #2   Title Pt will demonstratse 160 degrees of left shoulder abduction to allow pt to reach out to the sides    Baseline 128    Time 4    Period Weeks    Status New    Target Date 01/04/21      PT LONG TERM GOAL #3   Title Pt will report a 75% improvement in tightness and discomfort in left lateral trunk with end range shoulder ROM for improved comfort.    Time 4    Period Weeks    Status New    Target Date 01/04/21      PT LONG TERM GOAL #4   Title Pt will be independent in a home exercise program for continued strengthening and stretching    Time 4    Period Weeks    Status New    Target Date 01/04/21      PT LONG TERM GOAL #5   Title Pt will report no pull in the left UE with AROM due to cording    Time 4    Period Weeks    Status New    Target Date 01/04/21  Plan - 12/07/20 1658     Clinical Impression Statement Pt presents to PT with continued L pec tightness and decreased L shoulder ROM. Pt reports her pec has become more tight  since d/c from PT last year. She feels tightness when reaching for objects or reaching behind her head. She has a large thick cord palpable in inferior L axilla extending down to her L breast. Her L shoulder ROM improved greatly after PROM and myofascial release to cording. Pt would benefit from skilled PT services to decrease L pec tightness and cording to allow improved shoulder ROM. She would also benefit from dry needling but due to scheduling will not be able to start until the end of August.    Examination-Activity Limitations Reach Overhead    Stability/Clinical Decision Making Stable/Uncomplicated    Clinical Decision Making Low    Rehab Potential Good    PT Frequency 2x / week    PT Duration 4 weeks    PT Treatment/Interventions ADLs/Self Care Home Management;Manual techniques;Patient/family education;Therapeutic exercise;Passive range of motion;Manual lymph drainage;Vasopneumatic Device;Scar mobilization;Dry needling    PT Next Visit Plan pec stretches, L shoulder ROM, MFR to cording in L axilla - will begin dry needling on Aug 28    Consulted and Agree with Plan of Care Patient             Patient will benefit from skilled therapeutic intervention in order to improve the following deficits and impairments:  Decreased range of motion, Postural dysfunction, Decreased scar mobility, Impaired UE functional use, Increased fascial restricitons, Decreased strength  Visit Diagnosis: Aftercare following surgery for neoplasm  Disorder of the skin and subcutaneous tissue related to radiation, unspecified  Stiffness of left shoulder, not elsewhere classified  Abnormal posture  Malignant neoplasm of lower-inner quadrant of left breast in female, estrogen receptor positive ([place])     Problem List Patient Active Problem List   Diagnosis Date Noted   S/P breast reconstruction, bilateral 07/21/2019   Acquired absence of breast 05/22/2019   Breast cancer ([place]) 05/14/2019   Malignant  neoplasm of lower-inner quadrant of left breast in female, estrogen receptor positive ([place]) 03/24/2019   Allergic dermatitis 12/27/2017   Reactive airway disease without complication 58/25/1898   Allergic rhinitis 11/28/2016   Hyperlipidemia 11/28/2016    [name] [facility] 12/07/2020, 5:04 PM  [facility] [place], [place], [address] Phone: [phone]   Fax:  [phone]  Name: Alice Davis MRN: [id number] Date of Birth: May 05, 1971  Manus Gunning, PT 12/07/20 5:04 PM

## 2020-12-09 ENCOUNTER — Other Ambulatory Visit: Payer: Self-pay

## 2020-12-09 ENCOUNTER — Encounter: Payer: Self-pay | Admitting: Plastic Surgery

## 2020-12-09 ENCOUNTER — Telehealth (INDEPENDENT_AMBULATORY_CARE_PROVIDER_SITE_OTHER): Payer: BC Managed Care – PPO | Admitting: Plastic Surgery

## 2020-12-09 DIAGNOSIS — Z170 Estrogen receptor positive status [ER+]: Secondary | ICD-10-CM

## 2020-12-09 DIAGNOSIS — Z9889 Other specified postprocedural states: Secondary | ICD-10-CM

## 2020-12-09 DIAGNOSIS — C50312 Malignant neoplasm of lower-inner quadrant of left female breast: Secondary | ICD-10-CM

## 2020-12-14 ENCOUNTER — Encounter: Payer: Self-pay | Admitting: Physical Therapy

## 2020-12-14 ENCOUNTER — Other Ambulatory Visit: Payer: Self-pay

## 2020-12-14 ENCOUNTER — Ambulatory Visit: Payer: BC Managed Care – PPO | Admitting: Physical Therapy

## 2020-12-14 DIAGNOSIS — L599 Disorder of the skin and subcutaneous tissue related to radiation, unspecified: Secondary | ICD-10-CM

## 2020-12-14 DIAGNOSIS — M25612 Stiffness of left shoulder, not elsewhere classified: Secondary | ICD-10-CM

## 2020-12-14 DIAGNOSIS — Z483 Aftercare following surgery for neoplasm: Secondary | ICD-10-CM

## 2020-12-14 DIAGNOSIS — R293 Abnormal posture: Secondary | ICD-10-CM

## 2020-12-14 DIAGNOSIS — C50312 Malignant neoplasm of lower-inner quadrant of left female breast: Secondary | ICD-10-CM

## 2020-12-14 NOTE — Progress Notes (Signed)
The patient is a 50 year old female who I saw last week.  She had bilateral mastectomies with implant reconstruction.  She is doing very well.  She feels some changes with her implants.  This is likely related to the radiation.  For clarity and peace of mind she would like to go ahead with an ultrasound.  I think that that is reasonable.  I have placed the order.  I connected with  [name] on 12/14/20 by a video enabled telemedicine application and verified that I am speaking with the correct person using two identifiers. The patient was at home and I was at the office.  We spent 5 minutes discussing the plan.   I discussed the limitations of evaluation and management by telemedicine. The patient expressed understanding and agreed to proceed.

## 2020-12-14 NOTE — Therapy (Signed)
[place], [place], [address] Phone: [phone]   Fax:  [phone]  Physical Therapy Treatment  Patient Details  Name: Alice Davis MRN: [id number] Date of Birth: 1970/07/06 Referring Provider (PT): Joaquin Courts Date: 12/14/2020   PT End of Session - 12/14/20 1656     Visit Number 2    Number of Visits 9    Date for PT Re-Evaluation 01/04/21    Authorization - Number of Visits 33    PT Start Time 1603    PT Stop Time 1655    PT Time Calculation (min) 52 min    Activity Tolerance Patient tolerated treatment well    Behavior During Therapy [facility] for tasks assessed/performed             Past Medical History:  Diagnosis Date   Asthma    Cancer ([place])     Past Surgical History:  Procedure Laterality Date   BREAST RECONSTRUCTION WITH PLACEMENT OF TISSUE EXPANDER AND FLEX HD (ACELLULAR HYDRATED DERMIS) Bilateral 05/14/2019   Procedure: IMMEDIATE BREAST RECONSTRUCTION WITH PLACEMENT OF TISSUE EXPANDER AND FLEX HD (ACELLULAR HYDRATED DERMIS);  Surgeon: Wallace Going, DO;  Location: [place];  Service: Plastics;  Laterality: Bilateral;   NASAL SINUS SURGERY     20 years ago   NIPPLE SPARING MASTECTOMY WITH SENTINEL LYMPH NODE BIOPSY Bilateral 05/14/2019   Procedure: BILATERAL NIPPLE SPARING MASTECTOMIES WITH LEFT SENTINEL LYMPH NODE BIOPSY;  Surgeon: [name], MD;  Location: [place];  Service: General;  Laterality: Bilateral;  PEC   REMOVAL OF BILATERAL TISSUE EXPANDERS WITH PLACEMENT OF BILATERAL BREAST IMPLANTS Bilateral 07/13/2019   Procedure: REMOVAL OF BILATERAL TISSUE EXPANDERS WITH PLACEMENT OF BILATERAL BREAST IMPLANTS;  Surgeon: Wallace Going, DO;  Location: [place];  Service: Plastics;  Laterality: Bilateral;   RHINOPLASTY     20 years   WISDOM TOOTH EXTRACTION      There were no vitals filed for this visit.   Subjective Assessment - 12/14/20 1604      Subjective I can not tell any difference in my tightness yet.    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 No/denies    Pain Score 0-No pain                               [facility] Adult PT Treatment/Exercise - 12/14/20 0001       Manual Therapy   Myofascial Release to cording in L axilla extending down in to upper arm - instructed pts son in correct technique    Passive ROM of the left shoulder into flexion, abduction, and D2 at various angles with incorporation of pinning at the axilla and various locations along cording                    PT Education - 12/14/20 1658     Education Details how to stretch cording using manual techniques    Person(s) Educated Child(ren)    Methods Explanation;Demonstration;Verbal cues    Comprehension  Verbalized understanding;Returned demonstration                 PT Long Term Goals - 12/07/20 1702       PT LONG TERM GOAL #1   Title Pt will demonstrate 160 degrees of left shoulder flexion to allow pt to reach overhead    Baseline 144    Time 4    Period Weeks    Status New    Target Date 01/04/21      PT LONG TERM GOAL #2   Title Pt will demonstratse 160 degrees of left shoulder abduction to allow pt to reach out to the sides    Baseline 128    Time 4    Period Weeks    Status New    Target Date 01/04/21      PT LONG TERM GOAL #3   Title Pt will report a 75% improvement in tightness and discomfort in left lateral trunk with end range shoulder ROM for improved comfort.    Time 4    Period Weeks     Status New    Target Date 01/04/21      PT LONG TERM GOAL #4   Title Pt will be independent in a home exercise program for continued strengthening and stretching    Time 4    Period Weeks    Status New    Target Date 01/04/21      PT LONG TERM GOAL #5   Title Pt will report no pull in the left UE with AROM due to cording    Time 4    Period Weeks    Status New    Target Date 01/04/21                   Plan - 12/14/20 1659     Clinical Impression Statement Pt brought her son with her to therapy today so he could be educated on correct technique to stretch cording since pt has difficulty doing this on her own. She has a very thick cord extending from her left axilla down towards her upper arm and it seems to branch off in to smaller cords. Had pt's son demonstrate correct technique and educated him on correct amount of pressure to use. Therapist completed session with myofascial release to cording with cording feeling softer by end of session.    PT Frequency 2x / week    PT Duration 4 weeks    PT Treatment/Interventions ADLs/Self Care Home Management;Manual techniques;Patient/family education;Therapeutic exercise;Passive range of motion;Manual lymph drainage;Vasopneumatic Device;Scar mobilization;Dry needling    PT Next Visit Plan pec stretches, L shoulder ROM, MFR to cording in L axilla - will begin dry needling on Aug 28    [place] for foam roll    Consulted and Agree with Plan of Care Patient             Patient will benefit from skilled therapeutic intervention in order to improve the following deficits and impairments:  Decreased range of motion, Postural dysfunction, Decreased scar mobility, Impaired UE functional use, Increased fascial restricitons, Decreased strength  Visit Diagnosis: Aftercare following surgery for neoplasm  Disorder of the skin and subcutaneous tissue related to radiation, unspecified  Stiffness of left shoulder, not  elsewhere classified  Abnormal posture  Malignant neoplasm of lower-inner quadrant of left breast in female, estrogen receptor positive ([place])     Problem List Patient Active Problem List   Diagnosis Date Noted  S/P breast reconstruction, bilateral 07/21/2019   Acquired absence of breast 05/22/2019   Breast cancer ([place]) 05/14/2019   Malignant neoplasm of lower-inner quadrant of left breast in female, estrogen receptor positive ([place]) 03/24/2019   Allergic dermatitis 12/27/2017   Reactive airway disease without complication 25/83/4621   Allergic rhinitis 11/28/2016   Hyperlipidemia 11/28/2016    [name] [facility] 12/14/2020, 5:02 PM  [place], [place], [address] Phone: [phone]   Fax:  [phone]  Name: Alice Davis MRN: [id number] Date of Birth: 1971-04-14  Manus Gunning, PT 12/14/20 5:02 PM

## 2020-12-17 ENCOUNTER — Other Ambulatory Visit: Payer: Self-pay | Admitting: Hematology and Oncology

## 2020-12-19 ENCOUNTER — Other Ambulatory Visit: Payer: Self-pay

## 2020-12-19 ENCOUNTER — Encounter: Payer: Self-pay | Admitting: Physical Therapy

## 2020-12-19 ENCOUNTER — Ambulatory Visit: Payer: BC Managed Care – PPO | Admitting: Physical Therapy

## 2020-12-19 DIAGNOSIS — Z483 Aftercare following surgery for neoplasm: Secondary | ICD-10-CM | POA: Diagnosis not present

## 2020-12-19 DIAGNOSIS — R293 Abnormal posture: Secondary | ICD-10-CM

## 2020-12-19 DIAGNOSIS — C50312 Malignant neoplasm of lower-inner quadrant of left female breast: Secondary | ICD-10-CM

## 2020-12-19 DIAGNOSIS — L599 Disorder of the skin and subcutaneous tissue related to radiation, unspecified: Secondary | ICD-10-CM

## 2020-12-19 DIAGNOSIS — M25612 Stiffness of left shoulder, not elsewhere classified: Secondary | ICD-10-CM

## 2020-12-19 NOTE — Therapy (Signed)
[place], [place], [address] Phone: [phone]   Fax:  [phone]  Physical Therapy Treatment  Patient Details  Name: Alice Davis MRN: [id number] Date of Birth: Feb 11, 1971 Referring Provider (PT): Joaquin Courts Date: 12/19/2020   PT End of Session - 12/19/20 1656     Visit Number 3    Number of Visits 9    Date for PT Re-Evaluation 01/04/21    PT Start Time 1604    PT Stop Time 1655    PT Time Calculation (min) 51 min    Activity Tolerance Patient tolerated treatment well    Behavior During Therapy [facility] for tasks assessed/performed             Past Medical History:  Diagnosis Date   Asthma    Cancer ([place])     Past Surgical History:  Procedure Laterality Date   BREAST RECONSTRUCTION WITH PLACEMENT OF TISSUE EXPANDER AND FLEX HD (ACELLULAR HYDRATED DERMIS) Bilateral 05/14/2019   Procedure: IMMEDIATE BREAST RECONSTRUCTION WITH PLACEMENT OF TISSUE EXPANDER AND FLEX HD (ACELLULAR HYDRATED DERMIS);  Surgeon: Wallace Going, DO;  Location: [place];  Service: Plastics;  Laterality: Bilateral;   NASAL SINUS SURGERY     20 years ago   NIPPLE SPARING MASTECTOMY WITH SENTINEL LYMPH NODE BIOPSY Bilateral 05/14/2019   Procedure: BILATERAL NIPPLE SPARING MASTECTOMIES WITH LEFT SENTINEL LYMPH NODE BIOPSY;  Surgeon: [name], MD;  Location: [place];  Service: General;  Laterality: Bilateral;  PEC   REMOVAL OF BILATERAL TISSUE EXPANDERS WITH PLACEMENT OF BILATERAL BREAST IMPLANTS Bilateral 07/13/2019   Procedure: REMOVAL OF BILATERAL TISSUE EXPANDERS WITH PLACEMENT OF BILATERAL BREAST IMPLANTS;  Surgeon: Wallace Going, DO;  Location: [place];  Service: Plastics;  Laterality: Bilateral;   RHINOPLASTY     20 years   WISDOM TOOTH EXTRACTION      There were no vitals filed for this visit.   Subjective Assessment - 12/19/20 1655     Subjective I feel like it gets a little  better then gets tight again.    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 No/denies    Pain Score 0-No pain                               [facility] Adult PT Treatment/Exercise - 12/19/20 0001       Manual Therapy   Myofascial Release to cording in L axilla extending down in to upper arm and forearm    Passive ROM of the left shoulder into flexion with incorporation of pinning at the axilla and various locations along cording                         PT Long Term Goals - 12/07/20 1702       PT LONG TERM GOAL #1   Title Pt will demonstrate 160 degrees of left shoulder flexion to allow pt to reach overhead    Baseline 144    Time 4  Period Weeks    Status New    Target Date 01/04/21      PT LONG TERM GOAL #2   Title Pt will demonstratse 160 degrees of left shoulder abduction to allow pt to reach out to the sides    Baseline 128    Time 4    Period Weeks    Status New    Target Date 01/04/21      PT LONG TERM GOAL #3   Title Pt will report a 75% improvement in tightness and discomfort in left lateral trunk with end range shoulder ROM for improved comfort.    Time 4    Period Weeks    Status New    Target Date 01/04/21      PT LONG TERM GOAL #4   Title Pt will be independent in a home exercise program for continued strengthening and stretching    Time 4    Period Weeks    Status New    Target Date 01/04/21      PT LONG TERM GOAL #5   Title Pt will report no pull in the left UE with AROM due to cording    Time 4    Period Weeks     Status New    Target Date 01/04/21                   Plan - 12/19/20 1657     Clinical Impression Statement Continued with myofascial release to L axillary cording. Pt's cord could be palpated today down to her forearm and pt reports the pain goes down to thumb. Cord is very thick in axilla. By end of session cording was less palpable.    PT Frequency 2x / week    PT Duration 4 weeks    PT Treatment/Interventions ADLs/Self Care Home Management;Manual techniques;Patient/family education;Therapeutic exercise;Passive range of motion;Manual lymph drainage;Vasopneumatic Device;Scar mobilization;Dry needling    PT Next Visit Plan pec stretches, L shoulder ROM, MFR to cording in L axilla - will begin dry needling on Aug 28    Consulted and Agree with Plan of Care Patient             Patient will benefit from skilled therapeutic intervention in order to improve the following deficits and impairments:  Decreased range of motion, Postural dysfunction, Decreased scar mobility, Impaired UE functional use, Increased fascial restricitons, Decreased strength  Visit Diagnosis: Aftercare following surgery for neoplasm  Disorder of the skin and subcutaneous tissue related to radiation, unspecified  Stiffness of left shoulder, not elsewhere classified  Abnormal posture  Malignant neoplasm of lower-inner quadrant of left breast in female, estrogen receptor positive ([place])     Problem List Patient Active Problem List   Diagnosis Date Noted   S/P breast reconstruction, bilateral 07/21/2019   Acquired absence of breast 05/22/2019   Breast cancer ([place]) 05/14/2019   Malignant neoplasm of lower-inner quadrant of left breast in female, estrogen receptor positive ([place]) 03/24/2019   Allergic dermatitis 12/27/2017   Reactive airway disease without complication 89/84/2103   Allergic rhinitis 11/28/2016   Hyperlipidemia 11/28/2016    [name] [facility] 12/19/2020, 4:59 PM  [place], [place], [address] Phone: [phone]   Fax:  [phone]  Name: Alice Davis MRN: [id number] Date of Birth: February 16, 1971   Manus Gunning, PT 12/19/20 4:59 PM

## 2020-12-21 ENCOUNTER — Other Ambulatory Visit: Payer: Self-pay

## 2020-12-21 ENCOUNTER — Ambulatory Visit: Payer: BC Managed Care – PPO | Admitting: Physical Therapy

## 2020-12-21 ENCOUNTER — Encounter: Payer: Self-pay | Admitting: Physical Therapy

## 2020-12-21 DIAGNOSIS — R293 Abnormal posture: Secondary | ICD-10-CM

## 2020-12-21 DIAGNOSIS — Z170 Estrogen receptor positive status [ER+]: Secondary | ICD-10-CM

## 2020-12-21 DIAGNOSIS — L599 Disorder of the skin and subcutaneous tissue related to radiation, unspecified: Secondary | ICD-10-CM

## 2020-12-21 DIAGNOSIS — Z483 Aftercare following surgery for neoplasm: Secondary | ICD-10-CM

## 2020-12-21 DIAGNOSIS — C50312 Malignant neoplasm of lower-inner quadrant of left female breast: Secondary | ICD-10-CM

## 2020-12-21 DIAGNOSIS — M25612 Stiffness of left shoulder, not elsewhere classified: Secondary | ICD-10-CM

## 2020-12-21 NOTE — Therapy (Signed)
[place], [place], [address] Phone: [phone]   Fax:  [phone]  Physical Therapy Treatment  Patient Details  Name: Alice Davis MRN: [id number] Date of Birth: 02-11-1971 Referring Provider (PT): Joaquin Courts Date: 12/21/2020   PT End of Session - 12/21/20 1659     Visit Number 4    Number of Visits 9    Date for PT Re-Evaluation 01/04/21    PT Start Time 1604    PT Stop Time 1655    PT Time Calculation (min) 51 min    Activity Tolerance Patient tolerated treatment well    Behavior During Therapy [facility] for tasks assessed/performed             Past Medical History:  Diagnosis Date   Asthma    Cancer ([place])     Past Surgical History:  Procedure Laterality Date   BREAST RECONSTRUCTION WITH PLACEMENT OF TISSUE EXPANDER AND FLEX HD (ACELLULAR HYDRATED DERMIS) Bilateral 05/14/2019   Procedure: IMMEDIATE BREAST RECONSTRUCTION WITH PLACEMENT OF TISSUE EXPANDER AND FLEX HD (ACELLULAR HYDRATED DERMIS);  Surgeon: Wallace Going, DO;  Location: [place];  Service: Plastics;  Laterality: Bilateral;   NASAL SINUS SURGERY     20 years ago   NIPPLE SPARING MASTECTOMY WITH SENTINEL LYMPH NODE BIOPSY Bilateral 05/14/2019   Procedure: BILATERAL NIPPLE SPARING MASTECTOMIES WITH LEFT SENTINEL LYMPH NODE BIOPSY;  Surgeon: [name], MD;  Location: [place];  Service: General;  Laterality: Bilateral;  PEC   REMOVAL OF BILATERAL TISSUE EXPANDERS WITH PLACEMENT OF BILATERAL BREAST IMPLANTS Bilateral 07/13/2019   Procedure: REMOVAL OF BILATERAL TISSUE EXPANDERS WITH PLACEMENT OF BILATERAL BREAST IMPLANTS;  Surgeon: Wallace Going, DO;  Location: [place];  Service: Plastics;  Laterality: Bilateral;   RHINOPLASTY     20 years   WISDOM TOOTH EXTRACTION      There were no vitals filed for this visit.   Subjective Assessment - 12/21/20 1658     Subjective It is still tight.     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 No/denies    Pain Score 0-No pain                               [facility] Adult PT Treatment/Exercise - 12/21/20 0001       Manual Therapy   Myofascial Release to cording in L axilla extending down in to upper arm and forearm    Passive ROM of the left shoulder into flexion with incorporation of pinning at the axilla and various locations along cording                    PT Education - 12/21/20 1702     Education Details lymphedema risk reduction practices    Person(s) Educated Patient    Methods Explanation;Handout    Comprehension Verbalized understanding                 PT Long Term Goals - 12/07/20 1702  PT LONG TERM GOAL #1   Title Pt will demonstrate 160 degrees of left shoulder flexion to allow pt to reach overhead    Baseline 144    Time 4    Period Weeks    Status New    Target Date 01/04/21      PT LONG TERM GOAL #2   Title Pt will demonstratse 160 degrees of left shoulder abduction to allow pt to reach out to the sides    Baseline 128    Time 4    Period Weeks    Status New    Target Date 01/04/21      PT LONG TERM GOAL #3   Title Pt will report a 75% improvement in tightness and discomfort in left lateral trunk with end range shoulder ROM for improved comfort.    Time 4    Period Weeks    Status New    Target Date 01/04/21      PT LONG TERM GOAL #4   Title Pt will be independent in a home exercise program for continued strengthening and stretching    Time 4    Period  Weeks    Status New    Target Date 01/04/21      PT LONG TERM GOAL #5   Title Pt will report no pull in the left UE with AROM due to cording    Time 4    Period Weeks    Status New    Target Date 01/04/21                   Plan - 12/21/20 1700     Clinical Impression Statement Continued with myofascial release to L axilla today. There is still a very thick cord that begins in inferior axilla and goes down upper arm then several medium thickness cords at upper arm and several smaller cords palpable at forearm. Pt felt more loose at end of session. Educated pt in lymphedema risk reduction practices today. Pt also had tightness along L lateral and inferior breast so educated pt to try soft tissue mobilization to these areas to help reduce tightness.    PT Frequency 2x / week    PT Duration 4 weeks    PT Treatment/Interventions ADLs/Self Care Home Management;Manual techniques;Patient/family education;Therapeutic exercise;Passive range of motion;Manual lymph drainage;Vasopneumatic Device;Scar mobilization;Dry needling    PT Next Visit Plan pec stretches, L shoulder ROM, MFR to cording in L axilla - will begin dry needling on Aug 28    [facility] for foam roll    Consulted and Agree with Plan of Care Patient             Patient will benefit from skilled therapeutic intervention in order to improve the following deficits and impairments:  Decreased range of motion, Postural dysfunction, Decreased scar mobility, Impaired UE functional use, Increased fascial restricitons, Decreased strength  Visit Diagnosis: Aftercare following surgery for neoplasm  Disorder of the skin and subcutaneous tissue related to radiation, unspecified  Stiffness of left shoulder, not elsewhere classified  Abnormal posture  Malignant neoplasm of lower-inner quadrant of left breast in female, estrogen receptor positive ([place])     Problem List Patient Active Problem List    Diagnosis Date Noted   S/P breast reconstruction, bilateral 07/21/2019   Acquired absence of breast 05/22/2019   Breast cancer ([place]) 05/14/2019   Malignant neoplasm of lower-inner quadrant of left breast in female, estrogen receptor positive ([place]) 03/24/2019  Allergic dermatitis 12/27/2017   Reactive airway disease without complication 11/73/5670   Allergic rhinitis 11/28/2016   Hyperlipidemia 11/28/2016    [name] [facility] 12/21/2020, 5:03 PM  [place], [place], [address] Phone: [phone]   Fax:  [phone]  Name: Khira [place] MRN: [id number] Date of Birth: Feb 26, 1971  Manus Gunning, PT 12/21/20 5:03 PM

## 2020-12-24 ENCOUNTER — Ambulatory Visit
Admission: RE | Admit: 2020-12-24 | Discharge: 2020-12-24 | Disposition: A | Discharge status: Home / Self Care | Payer: BC Managed Care – PPO | Source: Ambulatory Visit | Attending: Plastic Surgery | Admitting: Plastic Surgery

## 2020-12-24 ENCOUNTER — Other Ambulatory Visit: Payer: Self-pay

## 2020-12-24 ENCOUNTER — Other Ambulatory Visit: Payer: Self-pay | Admitting: Plastic Surgery

## 2020-12-24 ENCOUNTER — Other Ambulatory Visit: Payer: BC Managed Care – PPO

## 2020-12-24 DIAGNOSIS — Z9889 Other specified postprocedural states: Secondary | ICD-10-CM

## 2020-12-24 DIAGNOSIS — C50312 Malignant neoplasm of lower-inner quadrant of left female breast: Secondary | ICD-10-CM

## 2020-12-26 ENCOUNTER — Telehealth: Payer: Self-pay

## 2020-12-26 ENCOUNTER — Ambulatory Visit: Payer: BC Managed Care – PPO | Admitting: Physical Therapy

## 2020-12-26 ENCOUNTER — Encounter: Payer: Self-pay | Admitting: Physical Therapy

## 2020-12-26 ENCOUNTER — Other Ambulatory Visit: Payer: Self-pay

## 2020-12-26 DIAGNOSIS — R293 Abnormal posture: Secondary | ICD-10-CM

## 2020-12-26 DIAGNOSIS — M25612 Stiffness of left shoulder, not elsewhere classified: Secondary | ICD-10-CM

## 2020-12-26 DIAGNOSIS — Z483 Aftercare following surgery for neoplasm: Secondary | ICD-10-CM

## 2020-12-26 DIAGNOSIS — C50312 Malignant neoplasm of lower-inner quadrant of left female breast: Secondary | ICD-10-CM

## 2020-12-26 DIAGNOSIS — L599 Disorder of the skin and subcutaneous tissue related to radiation, unspecified: Secondary | ICD-10-CM

## 2020-12-26 NOTE — Therapy (Signed)
[place], [place], [address] Phone: [phone]   Fax:  [phone]  Physical Therapy Treatment  Patient Details  Name: Alice Davis MRN: [id number] Date of Birth: June 23, 1970 Referring Provider (PT): Joaquin Courts Date: 12/26/2020   PT End of Session - 12/26/20 1654     Visit Number 5    Number of Visits 9    Date for PT Re-Evaluation 01/04/21    PT Start Time 1602    PT Stop Time 1651    PT Time Calculation (min) 49 min    Activity Tolerance Patient tolerated treatment well    Behavior During Therapy [facility] for tasks assessed/performed             Past Medical History:  Diagnosis Date   Asthma    Cancer ([place])     Past Surgical History:  Procedure Laterality Date   BREAST RECONSTRUCTION WITH PLACEMENT OF TISSUE EXPANDER AND FLEX HD (ACELLULAR HYDRATED DERMIS) Bilateral 05/14/2019   Procedure: IMMEDIATE BREAST RECONSTRUCTION WITH PLACEMENT OF TISSUE EXPANDER AND FLEX HD (ACELLULAR HYDRATED DERMIS);  Surgeon: Wallace Going, DO;  Location: [place];  Service: Plastics;  Laterality: Bilateral;   NASAL SINUS SURGERY     20 years ago   NIPPLE SPARING MASTECTOMY WITH SENTINEL LYMPH NODE BIOPSY Bilateral 05/14/2019   Procedure: BILATERAL NIPPLE SPARING MASTECTOMIES WITH LEFT SENTINEL LYMPH NODE BIOPSY;  Surgeon: [name], MD;  Location: [place];  Service: General;  Laterality: Bilateral;  PEC   REMOVAL OF BILATERAL TISSUE EXPANDERS WITH PLACEMENT OF BILATERAL BREAST IMPLANTS Bilateral 07/13/2019   Procedure: REMOVAL OF BILATERAL TISSUE EXPANDERS WITH PLACEMENT OF BILATERAL BREAST IMPLANTS;  Surgeon: Wallace Going, DO;  Location: [place];  Service: Plastics;  Laterality: Bilateral;   RHINOPLASTY     20 years   WISDOM TOOTH EXTRACTION      There were no vitals filed for this visit.   Subjective Assessment - 12/26/20 1602     Subjective I feel like the tightness is  moving more towards my armpit.    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 No/denies    Pain Score 0-No pain                               [facility] Adult PT Treatment/Exercise - 12/26/20 0001       Manual Therapy   Myofascial Release to cording in L axilla extending down in to upper arm and forearm - thick cording present in L axilla becoming more thin as it is traced down upper arm                         PT Long Term Goals - 12/07/20 1702       PT LONG TERM GOAL #1   Title Pt will demonstrate 160 degrees of left shoulder flexion to allow pt to reach overhead    Baseline 144    Time 4    Period Weeks  Status New    Target Date 01/04/21      PT LONG TERM GOAL #2   Title Pt will demonstratse 160 degrees of left shoulder abduction to allow pt to reach out to the sides    Baseline 128    Time 4    Period Weeks    Status New    Target Date 01/04/21      PT LONG TERM GOAL #3   Title Pt will report a 75% improvement in tightness and discomfort in left lateral trunk with end range shoulder ROM for improved comfort.    Time 4    Period Weeks    Status New    Target Date 01/04/21      PT LONG TERM GOAL #4   Title Pt will be independent in a home exercise program for continued strengthening and stretching    Time 4    Period Weeks    Status New    Target Date 01/04/21      PT LONG TERM GOAL #5   Title Pt will report no pull in the left UE with AROM due to cording    Time 4    Period Weeks    Status New    Target Date 01/04/21                    Plan - 12/26/20 1655     Clinical Impression Statement Continued with myofascial release to L axillary cording. She reports her cording discomfort is becoming more centered in her axilla. She still presents with very thick cording in her axilla that extends down her upper arm and becomes more thin. Focused on myofascial release to all cording today with UE in abduction. Pt felt improved mobility at end of session.    PT Frequency 2x / week    PT Duration 4 weeks    PT Treatment/Interventions ADLs/Self Care Home Management;Manual techniques;Patient/family education;Therapeutic exercise;Passive range of motion;Manual lymph drainage;Vasopneumatic Device;Scar mobilization;Dry needling    PT Next Visit Plan pec stretches, L shoulder ROM, MFR to cording in L axilla - will begin dry needling on Aug 28    [place] for foam roll    Consulted and Agree with Plan of Care Patient             Patient will benefit from skilled therapeutic intervention in order to improve the following deficits and impairments:  Decreased range of motion, Postural dysfunction, Decreased scar mobility, Impaired UE functional use, Increased fascial restricitons, Decreased strength  Visit Diagnosis: Aftercare following surgery for neoplasm  Disorder of the skin and subcutaneous tissue related to radiation, unspecified  Stiffness of left shoulder, not elsewhere classified  Abnormal posture  Malignant neoplasm of lower-inner quadrant of left breast in female, estrogen receptor positive ([place])     Problem List Patient Active Problem List   Diagnosis Date Noted   S/P breast reconstruction, bilateral 07/21/2019   Acquired absence of breast 05/22/2019   Breast cancer ([place]) 05/14/2019   Malignant neoplasm of lower-inner quadrant of left breast in female, estrogen receptor positive ([place]) 03/24/2019   Allergic dermatitis 12/27/2017   Reactive airway disease without  complication 42/59/5638   Allergic rhinitis 11/28/2016   Hyperlipidemia 11/28/2016    [name] [facility] 12/26/2020, 4:58 PM  [place], [place], [address] Phone: [phone]   Fax:  [phone]  Name: Alice Davis MRN: [id number] Date of Birth: 09-30-1970  Hilaria Ota  [place], PT 12/26/20 4:58 PM

## 2020-12-26 NOTE — Telephone Encounter (Signed)
Returned patient's call. Printed off [place] reading and will discuss with Dr. Marla Roe. Will call patient back and advised if she would like to peruse a MRI.

## 2020-12-26 NOTE — Telephone Encounter (Signed)
Patient called to say that she had her ultrasound on Saturday and during her appointment the doctor said that they usually have the patient get an MRI in this situation.  Patient would like to know if Dr. Marla Roe thinks she should have an MRI.  Patient said that she has met her deductible for the year, so if she needs one, then this is a good time to have it done.  Please call.

## 2020-12-27 NOTE — Telephone Encounter (Signed)
Advised patient Alice Davis was clear and normal. She would like to pursue MRI because of her history of breast cancer and for peace of mind.  Dr. Marla Roe will put in order.

## 2020-12-27 NOTE — Addendum Note (Signed)
Addended by: Wallace Going on: 12/27/2020 03:43 PM   Modules accepted: Orders

## 2020-12-28 ENCOUNTER — Other Ambulatory Visit: Payer: Self-pay

## 2020-12-28 ENCOUNTER — Ambulatory Visit: Payer: BC Managed Care – PPO | Admitting: Physical Therapy

## 2020-12-28 ENCOUNTER — Encounter: Payer: Self-pay | Admitting: Physical Therapy

## 2020-12-28 DIAGNOSIS — R293 Abnormal posture: Secondary | ICD-10-CM

## 2020-12-28 DIAGNOSIS — Z483 Aftercare following surgery for neoplasm: Secondary | ICD-10-CM | POA: Diagnosis not present

## 2020-12-28 DIAGNOSIS — L599 Disorder of the skin and subcutaneous tissue related to radiation, unspecified: Secondary | ICD-10-CM

## 2020-12-28 DIAGNOSIS — M25612 Stiffness of left shoulder, not elsewhere classified: Secondary | ICD-10-CM

## 2020-12-28 DIAGNOSIS — C50312 Malignant neoplasm of lower-inner quadrant of left female breast: Secondary | ICD-10-CM

## 2020-12-28 NOTE — Therapy (Signed)
[place], [place], [address] Phone: [phone]   Fax:  [phone]  Physical Therapy Treatment  Patient Details  Name: Alice Davis MRN: [id number] Date of Birth: July 22, 1970 Referring Provider (PT): Joaquin Courts Date: 12/28/2020   PT End of Session - 12/28/20 1659     Visit Number 6    Number of Visits 9    Date for PT Re-Evaluation 01/04/21    PT Start Time 9798    PT Stop Time 1651    PT Time Calculation (min) 46 min    Activity Tolerance Patient tolerated treatment well    Behavior During Therapy [facility] for tasks assessed/performed             Past Medical History:  Diagnosis Date   Asthma    Cancer ([place])     Past Surgical History:  Procedure Laterality Date   BREAST RECONSTRUCTION WITH PLACEMENT OF TISSUE EXPANDER AND FLEX HD (ACELLULAR HYDRATED DERMIS) Bilateral 05/14/2019   Procedure: IMMEDIATE BREAST RECONSTRUCTION WITH PLACEMENT OF TISSUE EXPANDER AND FLEX HD (ACELLULAR HYDRATED DERMIS);  Surgeon: Wallace Going, DO;  Location: [place];  Service: Plastics;  Laterality: Bilateral;   NASAL SINUS SURGERY     20 years ago   NIPPLE SPARING MASTECTOMY WITH SENTINEL LYMPH NODE BIOPSY Bilateral 05/14/2019   Procedure: BILATERAL NIPPLE SPARING MASTECTOMIES WITH LEFT SENTINEL LYMPH NODE BIOPSY;  Surgeon: [name], MD;  Location: [place];  Service: General;  Laterality: Bilateral;  PEC   REMOVAL OF BILATERAL TISSUE EXPANDERS WITH PLACEMENT OF BILATERAL BREAST IMPLANTS Bilateral 07/13/2019   Procedure: REMOVAL OF BILATERAL TISSUE EXPANDERS WITH PLACEMENT OF BILATERAL BREAST IMPLANTS;  Surgeon: Wallace Going, DO;  Location: [place];  Service: Plastics;  Laterality: Bilateral;   RHINOPLASTY     20 years   WISDOM TOOTH EXTRACTION      There were no vitals filed for this visit.   Subjective Assessment - 12/28/20 1658     Subjective I feel like it gets loose  after I leave and then tightens back up.    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 No/denies    Pain Score 0-No pain                               [facility] Adult PT Treatment/Exercise - 12/28/20 0001       Manual Therapy   Myofascial Release to cording in L axilla - cording less palpable today in upper arm - currently more centralized to her axilla                         PT Long Term Goals - 12/07/20 1702       PT LONG TERM GOAL #1   Title Pt will demonstrate 160 degrees of left shoulder flexion to allow pt to reach overhead    Baseline 144    Time 4    Period Weeks    Status New    Target Date  01/04/21      PT LONG TERM GOAL #2   Title Pt will demonstratse 160 degrees of left shoulder abduction to allow pt to reach out to the sides    Baseline 128    Time 4    Period Weeks    Status New    Target Date 01/04/21      PT LONG TERM GOAL #3   Title Pt will report a 75% improvement in tightness and discomfort in left lateral trunk with end range shoulder ROM for improved comfort.    Time 4    Period Weeks    Status New    Target Date 01/04/21      PT LONG TERM GOAL #4   Title Pt will be independent in a home exercise program for continued strengthening and stretching    Time 4    Period Weeks    Status New    Target Date 01/04/21      PT LONG TERM GOAL #5   Title Pt will report no pull in the left UE with AROM due to cording    Time 4    Period Weeks    Status New    Target Date 01/04/21                   Plan -  12/28/20 1659     Clinical Impression Statement Continued with myofascial release today. No cording could be felt down upper medial arm but there still is thick cording present in pt's L axilla that causes tightness with end range movement. Pt will begin dry needling in addition to myofascial release to see if that helps decrease her cording.    PT Frequency 2x / week    PT Duration 4 weeks    PT Treatment/Interventions ADLs/Self Care Home Management;Manual techniques;Patient/family education;Therapeutic exercise;Passive range of motion;Manual lymph drainage;Vasopneumatic Device;Scar mobilization;Dry needling    PT Next Visit Plan pec stretches, L shoulder ROM, MFR to cording in L axilla - will begin dry needling on Aug 28    [place] for foam roll    Consulted and Agree with Plan of Care Patient             Patient will benefit from skilled therapeutic intervention in order to improve the following deficits and impairments:  Decreased range of motion, Postural dysfunction, Decreased scar mobility, Impaired UE functional use, Increased fascial restricitons, Decreased strength  Visit Diagnosis: Aftercare following surgery for neoplasm  Disorder of the skin and subcutaneous tissue related to radiation, unspecified  Stiffness of left shoulder, not elsewhere classified  Abnormal posture  Malignant neoplasm of lower-inner quadrant of left breast in female, estrogen receptor positive ([place])     Problem List Patient Active Problem List   Diagnosis Date Noted   S/P breast reconstruction, bilateral 07/21/2019   Acquired absence of breast 05/22/2019   Breast cancer ([place]) 05/14/2019   Malignant neoplasm of lower-inner quadrant of left breast in female, estrogen receptor positive ([place]) 03/24/2019   Allergic dermatitis 12/27/2017   Reactive airway disease without complication 82/42/3536   Allergic rhinitis 11/28/2016   Hyperlipidemia 11/28/2016    [name]  [facility] 12/28/2020, 5:02 PM  [place], [place], [address] Phone: [phone]   Fax:  [phone]  Name: Alice Davis MRN: [id number] Date of Birth: 01-Dec-1970   Manus Gunning, PT 12/28/20 5:02 PM

## 2021-01-02 ENCOUNTER — Encounter: Payer: Self-pay | Admitting: Physical Therapy

## 2021-01-02 ENCOUNTER — Other Ambulatory Visit: Payer: Self-pay

## 2021-01-02 ENCOUNTER — Ambulatory Visit: Payer: BC Managed Care – PPO | Admitting: Physical Therapy

## 2021-01-02 DIAGNOSIS — Z483 Aftercare following surgery for neoplasm: Secondary | ICD-10-CM

## 2021-01-02 DIAGNOSIS — M25612 Stiffness of left shoulder, not elsewhere classified: Secondary | ICD-10-CM

## 2021-01-02 DIAGNOSIS — L599 Disorder of the skin and subcutaneous tissue related to radiation, unspecified: Secondary | ICD-10-CM

## 2021-01-02 DIAGNOSIS — R293 Abnormal posture: Secondary | ICD-10-CM

## 2021-01-02 NOTE — Therapy (Signed)
[place], [place], [address] Phone: [phone]   Fax:  [phone]  Physical Therapy Treatment  Patient Details  Name: Alice Davis MRN: [id number] Date of Birth: Jan 30, 1971 Referring Provider (PT): Joaquin Courts Date: 01/02/2021   PT End of Session - 01/02/21 1630     Visit Number 7    Number of Visits 9    Date for PT Re-Evaluation 01/04/21    PT Start Time 42    PT Stop Time 1717    PT Time Calculation (min) 47 min    Activity Tolerance Patient tolerated treatment well    Behavior During Therapy [facility] for tasks assessed/performed             Past Medical History:  Diagnosis Date   Asthma    Cancer ([place])     Past Surgical History:  Procedure Laterality Date   BREAST RECONSTRUCTION WITH PLACEMENT OF TISSUE EXPANDER AND FLEX HD (ACELLULAR HYDRATED DERMIS) Bilateral 05/14/2019   Procedure: IMMEDIATE BREAST RECONSTRUCTION WITH PLACEMENT OF TISSUE EXPANDER AND FLEX HD (ACELLULAR HYDRATED DERMIS);  Surgeon: Wallace Going, DO;  Location: [place];  Service: Plastics;  Laterality: Bilateral;   NASAL SINUS SURGERY     20 years ago   NIPPLE SPARING MASTECTOMY WITH SENTINEL LYMPH NODE BIOPSY Bilateral 05/14/2019   Procedure: BILATERAL NIPPLE SPARING MASTECTOMIES WITH LEFT SENTINEL LYMPH NODE BIOPSY;  Surgeon: [name], MD;  Location: [place];  Service: General;  Laterality: Bilateral;  PEC   REMOVAL OF BILATERAL TISSUE EXPANDERS WITH PLACEMENT OF BILATERAL BREAST IMPLANTS Bilateral 07/13/2019   Procedure: REMOVAL OF BILATERAL TISSUE EXPANDERS WITH PLACEMENT OF BILATERAL BREAST IMPLANTS;  Surgeon: Wallace Going, DO;  Location: [place];  Service: Plastics;  Laterality: Bilateral;   RHINOPLASTY     20 years   WISDOM TOOTH EXTRACTION      There were no vitals filed for this visit.   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, grade 2, margins negative, 1/3 lymph node positive ER 80-95 %, PR 80-95 %, HER-2 negative, Ki-67 2-20%right mastectomy: Benign; then bil implant exchange July 13, 2019, pt completed radiation 09/25/19, pt currently tamoxifen                [facility] PT Assessment - 01/02/21 0001       Assessment   Medical Diagnosis left breast cancer                           [facility] Adult PT Treatment/Exercise - 01/02/21 0001       Shoulder Exercises: Seated   Other Seated Exercises scapular retration with double ER 2 x 10 with RTB      Shoulder Exercises: Sidelying   Other Sidelying Exercises shoulder abduction 1 x 5   combined with scapular upward rotation mob.     Manual Therapy   Manual Therapy Scapular mobilization;Joint mobilization    Manual therapy comments skilled palpation and monitoring of pt throughout TPDN    Joint Mobilization GHJ PA grade III    Soft tissue mobilization MTPR along the pec major/ min    Myofascial Release STW along L axilla cording  Scapular Mobilization upward scapular mobs grade III, then MWM with active shoulder abduction              Trigger Point Dry Needling - 01/02/21 0001     Consent Given? Yes    Education Handout Provided Yes    Muscles Treated Upper Quadrant Teres minor;Teres major;Latissimus dorsi;Subscapularis;Infraspinatus;Biceps;Rhomboids    Rhomboids Response Twitch response elicited;Palpable increased muscle length   L   Infraspinatus Response Twitch response elicited;Palpable increased muscle length   L   Subscapularis Response Twitch response elicited;Palpable increased muscle length   L   Latissimus dorsi Response Twitch response elicited;Palpable increased muscle length   L   Teres major Response Twitch response  elicited;Palpable increased muscle length   L   Teres minor Response Twitch response elicited;Palpable increased muscle length   L   Biceps Response Twitch response elicited;Palpable increased muscle length   L                 PT Education - 01/02/21 1723     Education Details benefits of TPDN and what to expect. Benefits of MTPR and areas it can be applied.    Person(s) Educated Patient    Methods Explanation;Verbal cues;Handout    Comprehension Verbalized understanding;Verbal cues required                 PT Long Term Goals - 12/07/20 1702       PT LONG TERM GOAL #1   Title Pt will demonstrate 160 degrees of left shoulder flexion to allow pt to reach overhead    Baseline 144    Time 4    Period Weeks    Status New    Target Date 01/04/21      PT LONG TERM GOAL #2   Title Pt will demonstratse 160 degrees of left shoulder abduction to allow pt to reach out to the sides    Baseline 128    Time 4    Period Weeks    Status New    Target Date 01/04/21      PT LONG TERM GOAL #3   Title Pt will report a 75% improvement in tightness and discomfort in left lateral trunk with end range shoulder ROM for improved comfort.    Time 4    Period Weeks    Status New    Target Date 01/04/21      PT LONG TERM GOAL #4   Title Pt will be independent in a home exercise program for continued strengthening and stretching    Time 4    Period Weeks    Status New    Target Date 01/04/21      PT LONG TERM GOAL #5   Title Pt will report no pull in the left UE with AROM due to cording    Time 4    Period Weeks    Status New    Target Date 01/04/21                   Plan - 01/02/21 1729     Clinical Impression Statement pt arrives reporting limited L shoulder ROM specifically with end range external rotation and flexion. She has cording along the axilla. educated and consent was given for TPDN working on posterior shoulder musculature and along the biceps, and  MTPR was performed along the pec major/ minor. worked on scapular mobs to promote upward rotation and GHJ for anterior glide to faciliate ER. end of session she  noted feeling sore in the biceps and posterior shoulder which is likely related to the DN.    PT Treatment/Interventions ADLs/Self Care Home Management;Manual techniques;Patient/family education;Therapeutic exercise;Passive range of motion;Manual lymph drainage;Vasopneumatic Device;Scar mobilization;Dry needling    PT Next Visit Plan pec stretches, L shoulder ROM, MFR to cording in L axilla - response to DN, anterior GHJ PA, scapular mobs.    Consulted and Agree with Plan of Care Patient             Patient will benefit from skilled therapeutic intervention in order to improve the following deficits and impairments:  Decreased range of motion, Postural dysfunction, Decreased scar mobility, Impaired UE functional use, Increased fascial restricitons, Decreased strength  Visit Diagnosis: Aftercare following surgery for neoplasm  Disorder of the skin and subcutaneous tissue related to radiation, unspecified  Stiffness of left shoulder, not elsewhere classified  Abnormal posture     Problem List Patient Active Problem List   Diagnosis Date Noted   S/P breast reconstruction, bilateral 07/21/2019   Acquired absence of breast 05/22/2019   Breast cancer ([place]) 05/14/2019   Malignant neoplasm of lower-inner quadrant of left breast in female, estrogen receptor positive ([place]) 03/24/2019   Allergic dermatitis 12/27/2017   Reactive airway disease without complication 31/67/4255   Allergic rhinitis 11/28/2016   Hyperlipidemia 11/28/2016   [name] PT, DPT, LAT, ATC  01/02/21  5:34 PM     [facility] [address] [place], [place], [address] Phone: [phone]   Fax:  [phone]  Name: Alice Davis MRN: [id number] Date of Birth: [birth year]

## 2021-01-04 ENCOUNTER — Ambulatory Visit: Payer: BC Managed Care – PPO | Admitting: Physical Therapy

## 2021-01-04 ENCOUNTER — Encounter: Payer: Self-pay | Admitting: Physical Therapy

## 2021-01-04 ENCOUNTER — Other Ambulatory Visit: Payer: Self-pay

## 2021-01-04 DIAGNOSIS — Z483 Aftercare following surgery for neoplasm: Secondary | ICD-10-CM | POA: Diagnosis not present

## 2021-01-04 DIAGNOSIS — L599 Disorder of the skin and subcutaneous tissue related to radiation, unspecified: Secondary | ICD-10-CM

## 2021-01-04 DIAGNOSIS — M25612 Stiffness of left shoulder, not elsewhere classified: Secondary | ICD-10-CM

## 2021-01-04 DIAGNOSIS — R293 Abnormal posture: Secondary | ICD-10-CM

## 2021-01-04 DIAGNOSIS — C50312 Malignant neoplasm of lower-inner quadrant of left female breast: Secondary | ICD-10-CM

## 2021-01-04 DIAGNOSIS — Z170 Estrogen receptor positive status [ER+]: Secondary | ICD-10-CM

## 2021-01-04 NOTE — Therapy (Signed)
[place], [place], [address] Phone: [phone]   Fax:  [phone]  Physical Therapy Treatment  Patient Details  Name: Alice Davis MRN: [id number] Date of Birth: [birth year] Referring Provider (PT): Joaquin Courts Date: 01/04/2021   PT End of Session - 01/04/21 1700     Visit Number 8    Number of Visits 16    Date for PT Re-Evaluation 02/01/21    PT Start Time 1603    PT Stop Time 1655    PT Time Calculation (min) 52 min    Activity Tolerance Patient tolerated treatment well    Behavior During Therapy [facility] for tasks assessed/performed             Past Medical History:  Diagnosis Date   Asthma    Cancer ([place])     Past Surgical History:  Procedure Laterality Date   BREAST RECONSTRUCTION WITH PLACEMENT OF TISSUE EXPANDER AND FLEX HD (ACELLULAR HYDRATED DERMIS) Bilateral 05/14/2019   Procedure: IMMEDIATE BREAST RECONSTRUCTION WITH PLACEMENT OF TISSUE EXPANDER AND FLEX HD (ACELLULAR HYDRATED DERMIS);  Surgeon: Wallace Going, DO;  Location: [place];  Service: Plastics;  Laterality: Bilateral;   NASAL SINUS SURGERY     20 years ago   NIPPLE SPARING MASTECTOMY WITH SENTINEL LYMPH NODE BIOPSY Bilateral 05/14/2019   Procedure: BILATERAL NIPPLE SPARING MASTECTOMIES WITH LEFT SENTINEL LYMPH NODE BIOPSY;  Surgeon: [name], MD;  Location: [place];  Service: General;  Laterality: Bilateral;  PEC   REMOVAL OF BILATERAL TISSUE EXPANDERS WITH PLACEMENT OF BILATERAL BREAST IMPLANTS Bilateral 07/13/2019   Procedure: REMOVAL OF BILATERAL TISSUE EXPANDERS WITH PLACEMENT OF BILATERAL BREAST IMPLANTS;  Surgeon: Wallace Going, DO;  Location: [place];  Service: Plastics;  Laterality: Bilateral;   RHINOPLASTY     20 years   WISDOM TOOTH EXTRACTION      There were no vitals filed for this visit.   Subjective Assessment - 01/04/21 1606     Subjective I had dry needling and I  think it helped a little.    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 No/denies    Pain Score 0-No pain                               [facility] Adult PT Treatment/Exercise - 01/04/21 0001       Manual Therapy   Soft tissue mobilization to L pec in area of tightness    Myofascial Release to cording in L axilla - cording mostly centralized in L axilla, also to L pec                         PT Long Term Goals - 01/04/21 1703       PT LONG TERM GOAL #1   Title Pt will demonstrate 160 degrees of left shoulder flexion to allow pt to reach overhead    Baseline 144    Time 4    Status On-going  PT LONG TERM GOAL #2   Title Pt will demonstratse 160 degrees of left shoulder abduction to allow pt to reach out to the sides    Time 4    Period Weeks    Status On-going      PT LONG TERM GOAL #3   Title Pt will report a 75% improvement in tightness and discomfort in left lateral trunk with end range shoulder ROM for improved comfort.    Time 4    Period Weeks    Status On-going      PT LONG TERM GOAL #4   Title Pt will be independent in a home exercise program for continued strengthening and stretching    Time 4    Period Weeks    Status On-going      PT LONG TERM GOAL #5   Title Pt will report no pull in the left UE with AROM due to cording    Baseline 01/04/21- pt still has cording in L axilla that causes tightness at end range    Time 4    Period Weeks    Status On-going                   Plan - 01/04/21 1702     Clinical  Impression Statement Pt reports she felt improvements from dry needling last session. She does seem to have less tightness across L pec. Continued with myofascial release today to cording in L axilla with cording becoming more centralized to L axilla. Also worked on myofascial release across [employer] and soft tissue mobilization to left pec to help decrease tightness.    PT Frequency 2x / week    PT Duration 4 weeks    PT Treatment/Interventions ADLs/Self Care Home Management;Manual techniques;Patient/family education;Therapeutic exercise;Passive range of motion;Manual lymph drainage;Vasopneumatic Device;Scar mobilization;Dry needling    PT Next Visit Plan update goals- pec stretches, L shoulder ROM, MFR to cording in L axilla - response to DN, anterior GHJ PA, scapular mobs.    Consulted and Agree with Plan of Care Patient             Patient will benefit from skilled therapeutic intervention in order to improve the following deficits and impairments:  Decreased range of motion, Postural dysfunction, Decreased scar mobility, Impaired UE functional use, Increased fascial restricitons, Decreased strength  Visit Diagnosis: Aftercare following surgery for neoplasm  Disorder of the skin and subcutaneous tissue related to radiation, unspecified  Stiffness of left shoulder, not elsewhere classified  Abnormal posture  Malignant neoplasm of lower-inner quadrant of left breast in female, estrogen receptor positive ([place])     Problem List Patient Active Problem List   Diagnosis Date Noted   S/P breast reconstruction, bilateral 07/21/2019   Acquired absence of breast 05/22/2019   Breast cancer ([place]) 05/14/2019   Malignant neoplasm of lower-inner quadrant of left breast in female, estrogen receptor positive ([place]) 03/24/2019   Allergic dermatitis 12/27/2017   Reactive airway disease without complication 36/62/9476   Allergic rhinitis 11/28/2016   Hyperlipidemia 11/28/2016    [name]  [facility] 01/04/2021, 5:05 PM  [place], [place], [address] Phone: [phone]   Fax:  [phone]  Name: Alice Davis MRN: [id number] Date of Birth: [birth year]   Manus Gunning, PT 01/04/21 5:05 PM

## 2021-01-10 ENCOUNTER — Other Ambulatory Visit: Payer: Self-pay

## 2021-01-10 ENCOUNTER — Ambulatory Visit (HOSPITAL_COMMUNITY)
Admission: RE | Admit: 2021-01-10 | Discharge: 2021-01-10 | Disposition: A | Discharge status: Home / Self Care | Payer: BC Managed Care – PPO | Source: Ambulatory Visit | Attending: Plastic Surgery | Admitting: Plastic Surgery

## 2021-01-10 DIAGNOSIS — Z170 Estrogen receptor positive status [ER+]: Secondary | ICD-10-CM | POA: Insufficient documentation

## 2021-01-10 DIAGNOSIS — C50312 Malignant neoplasm of lower-inner quadrant of left female breast: Secondary | ICD-10-CM | POA: Diagnosis present

## 2021-01-10 DIAGNOSIS — Z9889 Other specified postprocedural states: Secondary | ICD-10-CM | POA: Diagnosis present

## 2021-01-10 MED ORDER — GADOBUTROL 1 MMOL/ML IV SOLN
6.0000 mL | Freq: Once | INTRAVENOUS | Status: AC | PRN
  Administered 2021-01-10: 6 mL via INTRAVENOUS

## 2021-01-11 ENCOUNTER — Ambulatory Visit: Payer: BC Managed Care – PPO | Attending: Radiation Oncology

## 2021-01-11 DIAGNOSIS — L599 Disorder of the skin and subcutaneous tissue related to radiation, unspecified: Secondary | ICD-10-CM | POA: Insufficient documentation

## 2021-01-11 DIAGNOSIS — M25612 Stiffness of left shoulder, not elsewhere classified: Secondary | ICD-10-CM | POA: Insufficient documentation

## 2021-01-11 DIAGNOSIS — Z483 Aftercare following surgery for neoplasm: Secondary | ICD-10-CM | POA: Insufficient documentation

## 2021-01-11 DIAGNOSIS — M25611 Stiffness of right shoulder, not elsewhere classified: Secondary | ICD-10-CM | POA: Diagnosis present

## 2021-01-11 DIAGNOSIS — C50312 Malignant neoplasm of lower-inner quadrant of left female breast: Secondary | ICD-10-CM | POA: Diagnosis present

## 2021-01-11 DIAGNOSIS — Z9013 Acquired absence of bilateral breasts and nipples: Secondary | ICD-10-CM | POA: Diagnosis present

## 2021-01-11 DIAGNOSIS — R293 Abnormal posture: Secondary | ICD-10-CM | POA: Diagnosis present

## 2021-01-11 DIAGNOSIS — Z170 Estrogen receptor positive status [ER+]: Secondary | ICD-10-CM | POA: Diagnosis present

## 2021-01-11 NOTE — Therapy (Signed)
[place], [place], [address] Phone: [phone]   Fax:  [phone]  Physical Therapy Treatment  Patient Details  Name: Alice Davis MRN: [id number] Date of Birth: 1970/11/10 Referring Provider (PT): Joaquin Courts Date: 01/11/2021   PT End of Session - 01/11/21 1713     Visit Number 9    Number of Visits 16    Date for PT Re-Evaluation 02/01/21    PT Start Time 1601    PT Stop Time 1657    PT Time Calculation (min) 56 min    Activity Tolerance Patient tolerated treatment well    Behavior During Therapy [facility] for tasks assessed/performed             Past Medical History:  Diagnosis Date   Asthma    Cancer ([place])     Past Surgical History:  Procedure Laterality Date   BREAST RECONSTRUCTION WITH PLACEMENT OF TISSUE EXPANDER AND FLEX HD (ACELLULAR HYDRATED DERMIS) Bilateral 05/14/2019   Procedure: IMMEDIATE BREAST RECONSTRUCTION WITH PLACEMENT OF TISSUE EXPANDER AND FLEX HD (ACELLULAR HYDRATED DERMIS);  Surgeon: Wallace Going, DO;  Location: [place];  Service: Plastics;  Laterality: Bilateral;   NASAL SINUS SURGERY     20 years ago   NIPPLE SPARING MASTECTOMY WITH SENTINEL LYMPH NODE BIOPSY Bilateral 05/14/2019   Procedure: BILATERAL NIPPLE SPARING MASTECTOMIES WITH LEFT SENTINEL LYMPH NODE BIOPSY;  Surgeon: [name], MD;  Location: [place];  Service: General;  Laterality: Bilateral;  PEC   REMOVAL OF BILATERAL TISSUE EXPANDERS WITH PLACEMENT OF BILATERAL BREAST IMPLANTS Bilateral 07/13/2019   Procedure: REMOVAL OF BILATERAL TISSUE EXPANDERS WITH PLACEMENT OF BILATERAL BREAST IMPLANTS;  Surgeon: Wallace Going, DO;  Location: [place];  Service: Plastics;  Laterality: Bilateral;   RHINOPLASTY     20 years   WISDOM TOOTH EXTRACTION      There were no vitals filed for this visit.   Subjective Assessment - 01/11/21 1557     Subjective The dry needling seemed to  help it the most. He really got in there on the areas with alot of force and it felt good. It felt alot looser.  I have been doing the bands, but not as many stretches.    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 No/denies    Pain Score 0-No pain    Pain Location Axilla    Pain Orientation Left    Pain Onset More than a month ago                               [facility] Adult PT Treatment/Exercise - 01/11/21 0001       Shoulder Exercises: Supine   Other Supine Exercises AROM flexion, scaption, horizontal abd x 5    Other Supine Exercises AAROM supine wand flexion and scaption x 5      Shoulder Exercises: Standing   Other Standing Exercises single arm chest stretch with wrist oscillation for NTS/cord stretching x 3      Manual Therapy  Soft tissue mobilization to left pectoralis in star gazer position, lats in supine and  in SL lats, serratus and scapular region    Myofascial Release to cording in L axilla /upper arm- cording mostly centralized in L axilla,    Passive ROM left shoulder with pinning for flexion, scaption, abduction, diagonal to restore functional ROM                  Upper Extremity Functional Index Score :   /80   PT Education - 01/11/21 1707     Education Details Pt was educated in supine wand flex and scaption and supine lying over towel roll to open and stretch pecs.    Person(s) Educated Patient    Methods Explanation;Handout    Comprehension Returned demonstration                 PT Long Term Goals - 01/04/21 1703        PT LONG TERM GOAL #1   Title Pt will demonstrate 160 degrees of left shoulder flexion to allow pt to reach overhead    Baseline 144    Time 4    Status On-going      PT LONG TERM GOAL #2   Title Pt will demonstratse 160 degrees of left shoulder abduction to allow pt to reach out to the sides    Time 4    Period Weeks    Status On-going      PT LONG TERM GOAL #3   Title Pt will report a 75% improvement in tightness and discomfort in left lateral trunk with end range shoulder ROM for improved comfort.    Time 4    Period Weeks    Status On-going      PT LONG TERM GOAL #4   Title Pt will be independent in a home exercise program for continued strengthening and stretching    Time 4    Period Weeks    Status On-going      PT LONG TERM GOAL #5   Title Pt will report no pull in the left UE with AROM due to cording    Baseline 01/04/21- pt still has cording in L axilla that causes tightness at end range    Time 4    Period Weeks    Status On-going                   Plan - 01/11/21 1714     Clinical Impression Statement Continued MFR techniques to cording at left axilla/upper arm, soft tissue mobilization to upper quarter with tightness felt throughout, pecs/lats and serratus, and PROM with pinning to improve left shoulder ROM. Pt also performed AAROM, and AROM, and supine pec stretches over a towel roll.  Updated HEP with supine wand exs, and pec towel stretch. Pts. ROM improved sigfificantly after treatment.  Pt encouraged to stretch atleast 2xs daily for faster improvement in ROM    Examination-Activity Limitations Reach Overhead    Stability/Clinical Decision Making Stable/Uncomplicated    Rehab Potential Good    PT Frequency 2x / week    PT Duration 4 weeks    PT Treatment/Interventions ADLs/Self Care Home Management;Manual techniques;Patient/family education;Therapeutic exercise;Passive range of motion;Manual lymph drainage;Vasopneumatic Device;Scar mobilization;Dry  needling    PT Next Visit Plan update goals- pec stretches, L shoulder ROM, MFR to cording in L axilla - response to DN, anterior GHJ PA, scapular mobs.    PT Home Exercise Plan [id number] for foam roll  Consulted and Agree with Plan of Care Patient             Patient will benefit from skilled therapeutic intervention in order to improve the following deficits and impairments:  Decreased range of motion, Postural dysfunction, Decreased scar mobility, Impaired UE functional use, Increased fascial restricitons, Decreased strength  Visit Diagnosis: Aftercare following surgery for neoplasm  Disorder of the skin and subcutaneous tissue related to radiation, unspecified  Stiffness of left shoulder, not elsewhere classified  Abnormal posture  Malignant neoplasm of lower-inner quadrant of left breast in female, estrogen receptor positive ([facility])  Stiffness of right shoulder, not elsewhere classified  Acquired absence of both breasts     Problem List Patient Active Problem List   Diagnosis Date Noted   S/P breast reconstruction, bilateral 07/21/2019   Acquired absence of breast 05/22/2019   Breast cancer ([place]) 05/14/2019   Malignant neoplasm of lower-inner quadrant of left breast in female, estrogen receptor positive ([place]) 03/24/2019   Allergic dermatitis 12/27/2017   Reactive airway disease without complication 22/44/9753   Allergic rhinitis 11/28/2016   Hyperlipidemia 11/28/2016    Claris Pong, PT 01/11/2021, 5:20 PM  [place], [place], [address] Phone: [phone]   Fax:  [phone]  Name: Alice Davis MRN: [id number] Date of Birth: 06-Apr-1971  Cheral Almas, PT 01/11/21 5:22 PM

## 2021-01-11 NOTE — Patient Instructions (Signed)
SHOULDER: Flexion - Supine (Cane)        Cancer Rehab [phone]    Hold cane in both hands. Raise arms up overhead. Do not allow back to arch. Hold _5__ seconds. Do __5-10__ times; __1-2__ times a day. Hands shoulder width Hands wider (V) position   Shoulder Blade Stretch    Clasp fingers behind head with elbows touching in front of face. Pull elbows back while pressing shoulder blades together. Relax and hold as tolerated, can place pillow under elbow here for comfort as needed and to allow for prolonged stretch.  Repeat __5__ times. Do __1-2__ sessions per day.   Supine stretch over towel roll:  3 positions with arms to stretch chest

## 2021-01-16 ENCOUNTER — Other Ambulatory Visit: Payer: Self-pay

## 2021-01-16 ENCOUNTER — Ambulatory Visit: Payer: BC Managed Care – PPO | Attending: General Surgery | Admitting: Physical Therapy

## 2021-01-16 DIAGNOSIS — M25612 Stiffness of left shoulder, not elsewhere classified: Secondary | ICD-10-CM | POA: Insufficient documentation

## 2021-01-16 DIAGNOSIS — R293 Abnormal posture: Secondary | ICD-10-CM | POA: Insufficient documentation

## 2021-01-16 NOTE — Therapy (Signed)
[place], [place], [address] Phone: [phone]   Fax:  [phone]  Physical Therapy Treatment  Patient Details  Name: Alice Davis MRN: [id number] Date of Birth: 12/09/1970 Referring Provider (PT): Joaquin Courts Date: 01/16/2021   PT End of Session - 01/16/21 1635     Visit Number 10    Number of Visits 16    Date for PT Re-Evaluation 02/01/21    PT Start Time 1630    PT Stop Time [id number]    PT Time Calculation (min) 45 min    Activity Tolerance Patient tolerated treatment well    Behavior During Therapy [facility] for tasks assessed/performed             Past Medical History:  Diagnosis Date   Asthma    Cancer ([place])     Past Surgical History:  Procedure Laterality Date   BREAST RECONSTRUCTION WITH PLACEMENT OF TISSUE EXPANDER AND FLEX HD (ACELLULAR HYDRATED DERMIS) Bilateral 05/14/2019   Procedure: IMMEDIATE BREAST RECONSTRUCTION WITH PLACEMENT OF TISSUE EXPANDER AND FLEX HD (ACELLULAR HYDRATED DERMIS);  Surgeon: Wallace Going, DO;  Location: [place];  Service: Plastics;  Laterality: Bilateral;   NASAL SINUS SURGERY     20 years ago   NIPPLE SPARING MASTECTOMY WITH SENTINEL LYMPH NODE BIOPSY Bilateral 05/14/2019   Procedure: BILATERAL NIPPLE SPARING MASTECTOMIES WITH LEFT SENTINEL LYMPH NODE BIOPSY;  Surgeon: [name], MD;  Location: [place];  Service: General;  Laterality: Bilateral;  PEC   REMOVAL OF BILATERAL TISSUE EXPANDERS WITH PLACEMENT OF BILATERAL BREAST IMPLANTS Bilateral 07/13/2019   Procedure: REMOVAL OF BILATERAL TISSUE EXPANDERS WITH PLACEMENT OF BILATERAL BREAST IMPLANTS;  Surgeon: Wallace Going, DO;  Location: [place];  Service: Plastics;  Laterality: Bilateral;   RHINOPLASTY     20 years   WISDOM TOOTH EXTRACTION      There were no vitals filed for this visit.   Subjective Assessment - 01/16/21 1635     Subjective "I notice I was able to get  more motion."    Patient Stated Goals to get better ROM and decrease tightness    Currently in Pain? No/denies    Pain Score 0-No pain    Pain Orientation Left                [facility] PT Assessment - 01/16/21 0001       Assessment   Medical Diagnosis left breast cancer                           [facility] Adult PT Treatment/Exercise - 01/16/21 0001       Manual Therapy   Manual therapy comments skilled palpation and monitoring of pt throughout TPDN    Joint Mobilization GHJ PA grade III    Soft tissue mobilization DTM along axillary triangle working along cording              Trigger Point Dry Needling - 01/16/21 0001     Consent Given? Yes    Education Handout Provided Yes    Muscles Treated Upper Quadrant Pectoralis major;Pectoralis minor    Pectoralis Major Response Twitch response elicited;Palpable increased muscle length   L   Pectoralis Minor Response Twitch response elicited;Palpable increased muscle length   L   Rhomboids Response Twitch response elicited;Palpable increased muscle length   L only   Infraspinatus Response Twitch response elicited;Palpable increased muscle length  L   Subscapularis Response Twitch response elicited;Palpable increased muscle length   L   Latissimus dorsi Response Twitch response elicited;Palpable increased muscle length   L   Teres minor Response Twitch response elicited;Palpable increased muscle length   L only   Biceps Response Twitch response elicited;Palpable increased muscle length   L                       PT Long Term Goals - 01/04/21 1703       PT LONG TERM GOAL #1   Title Pt will demonstrate 160 degrees of left shoulder flexion to allow pt to reach overhead    Baseline 144    Time 4    Status On-going      PT LONG TERM GOAL #2   Title Pt will demonstratse 160 degrees of left shoulder abduction to allow pt to reach out to the sides    Time 4    Period Weeks    Status On-going       PT LONG TERM GOAL #3   Title Pt will report a 75% improvement in tightness and discomfort in left lateral trunk with end range shoulder ROM for improved comfort.    Time 4    Period Weeks    Status On-going      PT LONG TERM GOAL #4   Title Pt will be independent in a home exercise program for continued strengthening and stretching    Time 4    Period Weeks    Status On-going      PT LONG TERM GOAL #5   Title Pt will report no pull in the left UE with AROM due to cording    Baseline 01/04/21- pt still has cording in L axilla that causes tightness at end range    Time 4    Period Weeks    Status On-going                   Plan - 01/16/21 1722     Clinical Impression Statement Alice Davis reports improvement since the last session with DN. cotninued with DN working around the L shoulder followed with DTM along the axiallary triangle. Continued working on shoulder ROM with GHJ mobs with shoulder in available end ranges. Encouraged HEP to be performed at minimum of 4-5 days aweek.  plan to work on scapualohumeral rhythm to maximize ROM and efficient biomechanics.    PT Treatment/Interventions ADLs/Self Care Home Management;Manual techniques;Patient/family education;Therapeutic exercise;Passive range of motion;Manual lymph drainage;Vasopneumatic Device;Scar mobilization;Dry needling    PT Next Visit Plan update goals- pec stretches, L shoulder ROM, MFR to cording in L axilla - response to DN, anterior GHJ PA, scapular mobs.    PT Home Exercise Plan [phone] for foam roll    Consulted and Agree with Plan of Care Patient             Patient will benefit from skilled therapeutic intervention in order to improve the following deficits and impairments:  Decreased range of motion, Postural dysfunction, Decreased scar mobility, Impaired UE functional use, Increased fascial restricitons, Decreased strength  Visit Diagnosis: Stiffness of left shoulder, not elsewhere  classified  Abnormal posture     Problem List Patient Active Problem List   Diagnosis Date Noted   S/P breast reconstruction, bilateral 07/21/2019   Acquired absence of breast 05/22/2019   Breast cancer ([place]) 05/14/2019   Malignant neoplasm of lower-inner quadrant of left breast in female, estrogen  receptor positive ([place]) 03/24/2019   Allergic dermatitis 12/27/2017   Reactive airway disease without complication [id number]   Allergic rhinitis 11/28/2016   Hyperlipidemia 11/28/2016   [name] PT, DPT, LAT, ATC  01/16/21  5:27 PM      [facility] [facility] [address] [place], [place], [address] Phone: [phone]   Fax:  [phone]  Name: Alice Davis MRN: [id number] Date of Birth: 1970/10/30

## 2021-01-19 ENCOUNTER — Telehealth: Payer: Self-pay

## 2021-01-19 NOTE — Telephone Encounter (Signed)
Call to patient -per Dr. Marla Roe to relay the results of her breast MRI- that the study was normal & no concerns. Patient was relieved with the results & reports no issues or concerns at this time- she is on an annual visit basis with Dr. Marla Roe & she will keep that schedule. I did remind her to call us for any changes or concerns

## 2021-01-23 ENCOUNTER — Ambulatory Visit: Payer: BC Managed Care – PPO | Admitting: Physical Therapy

## 2021-01-23 ENCOUNTER — Other Ambulatory Visit: Payer: Self-pay

## 2021-01-23 DIAGNOSIS — R293 Abnormal posture: Secondary | ICD-10-CM

## 2021-01-23 DIAGNOSIS — M25612 Stiffness of left shoulder, not elsewhere classified: Secondary | ICD-10-CM | POA: Diagnosis not present

## 2021-01-23 NOTE — Therapy (Signed)
[facility] [address] [place], [place], [address] Phone: [phone]   Fax:  [phone]  Physical Therapy Treatment  Patient Details  Name: Alice Davis MRN: [id number] Date of Birth: 01/15/1971 Referring Provider (PT): Toth   Encounter Date: 01/23/2021   PT End of Session - 01/23/21 1631     Visit Number 11    Number of Visits 16    Date for PT Re-Evaluation 02/01/21    PT Start Time 1631    PT Stop Time 1716    PT Time Calculation (min) 45 min    Activity Tolerance Patient tolerated treatment well    Behavior During Therapy WFL for tasks assessed/performed             Past Medical History:  Diagnosis Date   Asthma    Cancer (HCC)     Past Surgical History:  Procedure Laterality Date   BREAST RECONSTRUCTION WITH PLACEMENT OF TISSUE EXPANDER AND FLEX HD (ACELLULAR HYDRATED DERMIS) Bilateral 05/14/2019   Procedure: IMMEDIATE BREAST RECONSTRUCTION WITH PLACEMENT OF TISSUE EXPANDER AND FLEX HD (ACELLULAR HYDRATED DERMIS);  Surgeon: Dillingham, Claire S, DO;  Location: [facility];  Service: Plastics;  Laterality: Bilateral;   NASAL SINUS SURGERY     20 years ago   NIPPLE SPARING MASTECTOMY WITH SENTINEL LYMPH NODE BIOPSY Bilateral 05/14/2019   Procedure: BILATERAL NIPPLE SPARING MASTECTOMIES WITH LEFT SENTINEL LYMPH NODE BIOPSY;  Surgeon: Toth, Paul III, MD;  Location: [facility];  Service: General;  Laterality: Bilateral;  PEC   REMOVAL OF BILATERAL TISSUE EXPANDERS WITH PLACEMENT OF BILATERAL BREAST IMPLANTS Bilateral 07/13/2019   Procedure: REMOVAL OF BILATERAL TISSUE EXPANDERS WITH PLACEMENT OF BILATERAL BREAST IMPLANTS;  Surgeon: Dillingham, Claire S, DO;  Location: [facility];  Service: Plastics;  Laterality: Bilateral;   RHINOPLASTY     20 years   WISDOM TOOTH EXTRACTION      There were no vitals filed for this visit.   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 No/denies    Pain Score 0-No pain    Pain Orientation Left    Pain Type Chronic pain    Pain Onset More than a month ago                               [facility] Adult PT Treatment/Exercise - 01/23/21 0001       Shoulder Exercises: Supine   Other Supine Exercises thoracic extension over pink bolster with hands behind head with elbows adducted    Other Supine Exercises supine foam roll routine: (using rolled towels) ceiling punches, horizontal abd/adduction, alternating ceiling punches, x to Y, and back stroke 1 x 15 ea.      Shoulder Exercises: Stretch   Other Shoulder Stretches PNF contract relax pec stretch laying on rolled towels parallel to spine      Manual Therapy   Manual therapy comments skilled palpation and monitoring of pt throughout   TPDN    Joint Mobilization T1-T8 PA grade III, T5-T9 L rib springing    Soft tissue mobilization IASTM the upper trap/ rhomboids, infrapsinatus, teres major/ minor.              Trigger Point Dry Needling - 01/23/21 0001     Consent Given? Yes    Education Handout Provided Yes    Muscles Treated Head and Neck Upper trapezius    Upper Trapezius Response Twitch reponse elicited;Palpable increased muscle length   L   Rhomboids Response Palpable increased muscle length;Twitch response elicited   L   Infraspinatus Response Twitch response elicited;Palpable increased muscle length    Subscapularis Response Twitch response elicited;Palpable increased muscle  length   L only   Latissimus dorsi Response Twitch response elicited;Palpable increased muscle length   L   Teres major Response Twitch response elicited;Palpable increased muscle length   L   Teres minor Response Twitch response elicited;Palpable increased muscle length   L only                       PT Long Term Goals - 01/04/21 1703       PT LONG TERM GOAL #1   Title Pt will demonstrate 160 degrees of left shoulder flexion to allow pt to reach overhead    Baseline 144    Time 4    Status On-going      PT LONG TERM GOAL #2   Title Pt will demonstratse 160 degrees of left shoulder abduction to allow pt to reach out to the sides    Time 4    Period Weeks    Status On-going      PT LONG TERM GOAL #3   Title Pt will report a 75% improvement in tightness and discomfort in left lateral trunk with end range shoulder ROM for improved comfort.    Time 4    Period Weeks    Status On-going      PT LONG TERM GOAL #4   Title Pt will be independent in a home exercise program for continued strengthening and stretching    Time 4    Period Weeks    Status On-going      PT LONG TERM GOAL #5   Title Pt will report no pull in the left UE with AROM due to cording    Baseline 01/04/21- pt still has cording in L axilla that causes tightness at end range    Time 4    Period Weeks    Status On-going                   Plan - 01/23/21 1719     Clinical Impression Statement pt arrives reporting continued stiffnesss but notes she feels it is getting better. continued TPDN focusing on the L posterior shoulder musculsture followed with IASTM techniques. Worked on thoracic mobility and L rib springing. utilzied rolled towels to foam roll routine to promote scapulothoracic mobility. End of session she noted some soreness which is likely related to the DN, but overall felt like she had more ROM.    PT Treatment/Interventions ADLs/Self Care Home Management;Manual  techniques;Patient/family education;Therapeutic exercise;Passive range of motion;Manual lymph drainage;Vasopneumatic Device;Scar mobilization;Dry needling    PT Next Visit Plan update goals- pec stretches, L shoulder ROM, MFR to cording in L axilla - response to DN, anterior GHJ PA, scapular mobs.    PT Home Exercise Plan [id number]    Consulted and  Agree with Plan of Care Patient             Patient will benefit from skilled therapeutic intervention in order to improve the following deficits and impairments:  Decreased range of motion, Postural dysfunction, Decreased scar mobility, Impaired UE functional use, Increased fascial restricitons, Decreased strength  Visit Diagnosis: Stiffness of left shoulder, not elsewhere classified  Abnormal posture     Problem List Patient Active Problem List   Diagnosis Date Noted   S/P breast reconstruction, bilateral 07/21/2019   Acquired absence of breast 05/22/2019   Breast cancer (HCC) 05/14/2019   Malignant neoplasm of lower-inner quadrant of left breast in female, estrogen receptor positive (HCC) 03/24/2019   Allergic dermatitis 12/27/2017   Reactive airway disease without complication 12/13/2016   Allergic rhinitis 11/28/2016   Hyperlipidemia 11/28/2016   Kristoffer Leamon PT, DPT, LAT, ATC  01/23/21  5:23 PM     [facility] [address] [place], [place], [address] Phone: [phone]   Fax:  [phone]  Name: Alice Davis MRN: [id number] Date of Birth: 07/02/1970    

## 2021-01-25 ENCOUNTER — Ambulatory Visit: Payer: BC Managed Care – PPO | Admitting: Physical Therapy

## 2021-01-25 ENCOUNTER — Encounter: Payer: Self-pay | Admitting: Physical Therapy

## 2021-01-25 ENCOUNTER — Other Ambulatory Visit: Payer: Self-pay

## 2021-01-25 DIAGNOSIS — C50312 Malignant neoplasm of lower-inner quadrant of left female breast: Secondary | ICD-10-CM

## 2021-01-25 DIAGNOSIS — Z483 Aftercare following surgery for neoplasm: Secondary | ICD-10-CM | POA: Diagnosis not present

## 2021-01-25 DIAGNOSIS — M25612 Stiffness of left shoulder, not elsewhere classified: Secondary | ICD-10-CM

## 2021-01-25 DIAGNOSIS — L599 Disorder of the skin and subcutaneous tissue related to radiation, unspecified: Secondary | ICD-10-CM

## 2021-01-25 DIAGNOSIS — R293 Abnormal posture: Secondary | ICD-10-CM

## 2021-01-25 NOTE — Therapy (Signed)
[place], [place], [address] Phone: [phone]   Fax:  [phone]  Physical Therapy Treatment  Patient Details  Name: Alice Davis MRN: [id number] Date of Birth: 05-07-1971 Referring Provider (PT): Joaquin Courts Date: 01/25/2021   PT End of Session - 01/25/21 1657     Visit Number 12    Number of Visits 16    Date for PT Re-Evaluation 02/01/21    PT Start Time 1606    PT Stop Time 1656    PT Time Calculation (min) 50 min    Activity Tolerance Patient tolerated treatment well    Behavior During Therapy [facility] for tasks assessed/performed             Past Medical History:  Diagnosis Date   Asthma    Cancer ([place])     Past Surgical History:  Procedure Laterality Date   BREAST RECONSTRUCTION WITH PLACEMENT OF TISSUE EXPANDER AND FLEX HD (ACELLULAR HYDRATED DERMIS) Bilateral 05/14/2019   Procedure: IMMEDIATE BREAST RECONSTRUCTION WITH PLACEMENT OF TISSUE EXPANDER AND FLEX HD (ACELLULAR HYDRATED DERMIS);  Surgeon: Wallace Going, DO;  Location: [place] [facility];  Service: Plastics;  Laterality: Bilateral;   NASAL SINUS SURGERY     20 years ago   NIPPLE SPARING MASTECTOMY WITH SENTINEL LYMPH NODE BIOPSY Bilateral 05/14/2019   Procedure: BILATERAL NIPPLE SPARING MASTECTOMIES WITH LEFT SENTINEL LYMPH NODE BIOPSY;  Surgeon: [name], MD;  Location: [facility];  Service: General;  Laterality: Bilateral;  PEC   REMOVAL OF BILATERAL TISSUE EXPANDERS WITH PLACEMENT OF BILATERAL BREAST IMPLANTS Bilateral 07/13/2019   Procedure: REMOVAL OF BILATERAL TISSUE EXPANDERS WITH PLACEMENT OF BILATERAL BREAST IMPLANTS;  Surgeon: Wallace Going, DO;  Location: [facility];  Service: Plastics;  Laterality: Bilateral;   RHINOPLASTY     20 years   WISDOM TOOTH EXTRACTION      There were no vitals filed for this visit.   Subjective Assessment - 01/25/21 1656     Subjective The cording keeps coming  back.    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 No/denies    Pain Score 0-No pain                               [facility] Adult PT Treatment/Exercise - 01/25/21 0001       Manual Therapy   Myofascial Release to cording in L axilla /upper arm- cording throughout L upper arm with numerous cords palpable                          PT Long Term Goals - 01/04/21 1703       PT LONG TERM GOAL #1   Title Pt will demonstrate 160 degrees of left shoulder flexion to allow pt to reach overhead    Baseline 144    Time 4    Status On-going      PT LONG TERM GOAL #2   Title Pt will demonstratse 160 degrees of left shoulder abduction  to allow pt to reach out to the sides    Time 4    Period Weeks    Status On-going      PT LONG TERM GOAL #3   Title Pt will report a 75% improvement in tightness and discomfort in left lateral trunk with end range shoulder ROM for improved comfort.    Time 4    Period Weeks    Status On-going      PT LONG TERM GOAL #4   Title Pt will be independent in a home exercise program for continued strengthening and stretching    Time 4    Period Weeks    Status On-going      PT LONG TERM GOAL #5   Title Pt will report no pull in the left UE with AROM due to cording    Baseline 01/04/21- pt still has cording in L axilla that causes tightness at end range    Time 4    Period Weeks    Status On-going                   Plan - 01/25/21 1659     Clinical Impression Statement Continued with myofascial release to  cording in L axilla extending down upper arm and to scar. Several cords palpable today that are very thick. Cording felt less taut by end of session today but was still present. Educated pt on importance of continued stretching to help decrease cording.    PT Frequency 2x / week    PT Duration 4 weeks    PT Treatment/Interventions ADLs/Self Care Home Management;Manual techniques;Patient/family education;Therapeutic exercise;Passive range of motion;Manual lymph drainage;Vasopneumatic Device;Scar mobilization;Dry needling    PT Next Visit Plan update goals- pec stretches, L shoulder ROM, MFR to cording in L axilla - response to DN, anterior GHJ PA, scapular mobs.    PT Home Exercise Plan [id number]    Consulted and Agree with Plan of Care Patient             Patient will benefit from skilled therapeutic intervention in order to improve the following deficits and impairments:  Decreased range of motion, Postural dysfunction, Decreased scar mobility, Impaired UE functional use, Increased fascial restricitons, Decreased strength  Visit Diagnosis: Stiffness of left shoulder, not elsewhere classified  Abnormal posture  Aftercare following surgery for neoplasm  Disorder of the skin and subcutaneous tissue related to radiation, unspecified  Malignant neoplasm of lower-inner quadrant of left breast in female, estrogen receptor positive ([place])     Problem List Patient Active Problem List   Diagnosis Date Noted   S/P breast reconstruction, bilateral 07/21/2019   Acquired absence of breast 05/22/2019   Breast cancer ([place]) 05/14/2019   Malignant neoplasm of lower-inner quadrant of left breast in female, estrogen receptor positive ([place]) 03/24/2019   Allergic dermatitis 12/27/2017   Reactive airway disease without complication 15/17/6160   Allergic rhinitis 11/28/2016   Hyperlipidemia 11/28/2016    Manus Gunning, PT 01/25/2021, 5:00 PM  [place], [place], [address] Phone: [phone]   Fax:  [phone]  Name: Jazmin Ley MRN: [id number] Date of Birth: May 29, 1970  Manus Gunning, PT 01/25/21 5:00 PM

## 2021-01-26 ENCOUNTER — Ambulatory Visit: Payer: BC Managed Care – PPO | Admitting: Physical Therapy

## 2021-01-30 ENCOUNTER — Ambulatory Visit: Payer: BC Managed Care – PPO | Admitting: Physical Therapy

## 2021-01-30 ENCOUNTER — Encounter: Payer: Self-pay | Admitting: Physical Therapy

## 2021-01-30 ENCOUNTER — Other Ambulatory Visit: Payer: Self-pay

## 2021-01-30 DIAGNOSIS — M25612 Stiffness of left shoulder, not elsewhere classified: Secondary | ICD-10-CM

## 2021-01-30 DIAGNOSIS — R293 Abnormal posture: Secondary | ICD-10-CM

## 2021-01-30 NOTE — Therapy (Signed)
[place], [place], [address] Phone: [phone]   Fax:  [phone]  Physical Therapy Treatment  Patient Details  Name: Alice Davis MRN: [id number] Date of Birth: Mar 06, 1971 Referring Provider (PT): Joaquin Courts Date: 01/30/2021   PT End of Session - 01/30/21 1634     Visit Number 13    Number of Visits 16    Date for PT Re-Evaluation 02/01/21    PT Start Time 1633    PT Stop Time 1715    PT Time Calculation (min) 42 min    Activity Tolerance Patient tolerated treatment well    Behavior During Therapy [facility] for tasks assessed/performed             Past Medical History:  Diagnosis Date   Asthma    Cancer ([place])     Past Surgical History:  Procedure Laterality Date   BREAST RECONSTRUCTION WITH PLACEMENT OF TISSUE EXPANDER AND FLEX HD (ACELLULAR HYDRATED DERMIS) Bilateral 05/14/2019   Procedure: IMMEDIATE BREAST RECONSTRUCTION WITH PLACEMENT OF TISSUE EXPANDER AND FLEX HD (ACELLULAR HYDRATED DERMIS);  Surgeon: Wallace Going, DO;  Location: [facility];  Service: Plastics;  Laterality: Bilateral;   NASAL SINUS SURGERY     20 years ago   NIPPLE SPARING MASTECTOMY WITH SENTINEL LYMPH NODE BIOPSY Bilateral 05/14/2019   Procedure: BILATERAL NIPPLE SPARING MASTECTOMIES WITH LEFT SENTINEL LYMPH NODE BIOPSY;  Surgeon: [name], MD;  Location: [facility];  Service: General;  Laterality: Bilateral;  PEC   REMOVAL OF BILATERAL TISSUE EXPANDERS WITH PLACEMENT OF BILATERAL BREAST IMPLANTS Bilateral 07/13/2019   Procedure: REMOVAL OF BILATERAL TISSUE EXPANDERS WITH PLACEMENT OF BILATERAL BREAST IMPLANTS;  Surgeon: Wallace Going, DO;  Location: [facility];  Service: Plastics;  Laterality: Bilateral;   RHINOPLASTY     20 years   WISDOM TOOTH EXTRACTION      There were no vitals filed for this visit.   Subjective Assessment - 01/30/21 1634     Subjective "I feel like I am doing better,  but was told last session that the cording continues to be thick and coming back."    Patient Stated Goals to get better ROM and decrease tightness    Currently in Pain? No/denies                [facility] PT Assessment - 01/30/21 0001       Assessment   Medical Diagnosis left breast cancer                             Trigger Point Dry Needling - 01/30/21 0001     Consent Given? Yes    Education Handout Provided Previously provided    Muscles Treated Wrist/Hand Extensor carpi radialis longus/brevis    Other Dry Needling DN focusing on along the cording in the proximal arm/ shoulder    Pectoralis Major Response Twitch response elicited;Palpable increased muscle length    Biceps Response Twitch response elicited;Palpable increased muscle length   L only   Extensor carpi radialis longus/brevis Response Twitch response elicited;Palpable increased muscle length   L              [facility] Adult PT Treatment/Exercise:  Therapeutic Exercise: Pec stretch 3 x 30 with over pressure Bicep stretch 3 x 30 sec with   Manual Therapy: Skilled palpation and monitoring of pt during TPDN IASTM / DTM focusing on along the medial  aspect of the bicep into the axilla, and the pec major/ minor  Neuromuscular re-ed: NA  Therapeutic Activity: N/A  Self Care: N/A  ITEMS NOT PERFORMED TODAY:          PT Long Term Goals - 01/04/21 1703       PT LONG TERM GOAL #1   Title Pt will demonstrate 160 degrees of left shoulder flexion to allow pt to reach overhead    Baseline 144    Time 4    Status On-going      PT LONG TERM GOAL #2   Title Pt will demonstratse 160 degrees of left shoulder abduction to allow pt to reach out to the sides    Time 4    Period Weeks    Status On-going      PT LONG TERM GOAL #3   Title Pt will report a 75% improvement in tightness and discomfort in left lateral trunk with end range shoulder ROM for improved comfort.    Time 4     Period Weeks    Status On-going      PT LONG TERM GOAL #4   Title Pt will be independent in a home exercise program for continued strengthening and stretching    Time 4    Period Weeks    Status On-going      PT LONG TERM GOAL #5   Title Pt will report no pull in the left UE with AROM due to cording    Baseline 01/04/21- pt still has cording in L axilla that causes tightness at end range    Time 4    Period Weeks    Status On-going                   Plan - 01/30/21 1727     Clinical Impression Statement pt arrives reporting no pain but notes continued cording in the L axilla. Continued TPDN focusing on the bicep brachii, the common wrist extensors, and along the cording in the proximal shoulder. Extensive IASTM techniqeus and dTM utililzed following DN. following session she noted reduction in stiffness in the proximal shoulder but did report  stiffness mostly along the pec into the upper L flank.    PT Treatment/Interventions ADLs/Self Care Home Management;Manual techniques;Patient/family education;Therapeutic exercise;Passive range of motion;Manual lymph drainage;Vasopneumatic Device;Scar mobilization;Dry needling    PT Next Visit Plan update goals- pec stretches, L shoulder ROM, MFR to cording in L axilla - response to DN, anterior GHJ PA, scapular mobs.    Consulted and Agree with Plan of Care Patient             Patient will benefit from skilled therapeutic intervention in order to improve the following deficits and impairments:  Decreased range of motion, Postural dysfunction, Decreased scar mobility, Impaired UE functional use, Increased fascial restricitons, Decreased strength  Visit Diagnosis: Stiffness of left shoulder, not elsewhere classified  Abnormal posture     Problem List Patient Active Problem List   Diagnosis Date Noted   S/P breast reconstruction, bilateral 07/21/2019   Acquired absence of breast 05/22/2019   Breast cancer ([place]) 05/14/2019    Malignant neoplasm of lower-inner quadrant of left breast in female, estrogen receptor positive ([place]) 03/24/2019   Allergic dermatitis 12/27/2017   Reactive airway disease without complication 42/59/5638   Allergic rhinitis 11/28/2016   Hyperlipidemia 11/28/2016   [name] PT, DPT, LAT, ATC  01/30/21  5:32 PM      [facility]  [place], [place], [address] Phone: [phone]   Fax:  [phone]  Name: Alice Davis MRN: [id number] Date of Birth: 1970/11/04

## 2021-02-01 ENCOUNTER — Ambulatory Visit: Payer: BC Managed Care – PPO | Admitting: Physical Therapy

## 2021-02-01 ENCOUNTER — Encounter: Payer: Self-pay | Admitting: Physical Therapy

## 2021-02-01 ENCOUNTER — Other Ambulatory Visit: Payer: Self-pay

## 2021-02-01 DIAGNOSIS — C50312 Malignant neoplasm of lower-inner quadrant of left female breast: Secondary | ICD-10-CM

## 2021-02-01 DIAGNOSIS — Z483 Aftercare following surgery for neoplasm: Secondary | ICD-10-CM

## 2021-02-01 DIAGNOSIS — L599 Disorder of the skin and subcutaneous tissue related to radiation, unspecified: Secondary | ICD-10-CM

## 2021-02-01 DIAGNOSIS — R293 Abnormal posture: Secondary | ICD-10-CM

## 2021-02-01 DIAGNOSIS — M25612 Stiffness of left shoulder, not elsewhere classified: Secondary | ICD-10-CM

## 2021-02-01 DIAGNOSIS — Z170 Estrogen receptor positive status [ER+]: Secondary | ICD-10-CM

## 2021-02-01 NOTE — Therapy (Signed)
[place], [place], [address] Phone: [phone]   Fax:  [phone]  Physical Therapy Treatment  Patient Details  Name: Alice Davis MRN: [id number] Date of Birth: [birth year] Referring Provider (PT): Joaquin Courts Date: 02/01/2021   PT End of Session - 02/01/21 1610     Visit Number 14    Number of Visits 22    Date for PT Re-Evaluation 03/01/21    PT Start Time 1605    PT Stop Time 1655    PT Time Calculation (min) 50 min    Activity Tolerance Patient tolerated treatment well    Behavior During Therapy [facility] for tasks assessed/performed             Past Medical History:  Diagnosis Date   Asthma    Cancer ([place])     Past Surgical History:  Procedure Laterality Date   BREAST RECONSTRUCTION WITH PLACEMENT OF TISSUE EXPANDER AND FLEX HD (ACELLULAR HYDRATED DERMIS) Bilateral 05/14/2019   Procedure: IMMEDIATE BREAST RECONSTRUCTION WITH PLACEMENT OF TISSUE EXPANDER AND FLEX HD (ACELLULAR HYDRATED DERMIS);  Surgeon: Wallace Going, DO;  Location: [place];  Service: Plastics;  Laterality: Bilateral;   NASAL SINUS SURGERY     20 years ago   NIPPLE SPARING MASTECTOMY WITH SENTINEL LYMPH NODE BIOPSY Bilateral 05/14/2019   Procedure: BILATERAL NIPPLE SPARING MASTECTOMIES WITH LEFT SENTINEL LYMPH NODE BIOPSY;  Surgeon: [name], MD;  Location: [place];  Service: General;  Laterality: Bilateral;  PEC   REMOVAL OF BILATERAL TISSUE EXPANDERS WITH PLACEMENT OF BILATERAL BREAST IMPLANTS Bilateral 07/13/2019   Procedure: REMOVAL OF BILATERAL TISSUE EXPANDERS WITH PLACEMENT OF BILATERAL BREAST IMPLANTS;  Surgeon: Wallace Going, DO;  Location: [place];  Service: Plastics;  Laterality: Bilateral;   RHINOPLASTY     20 years   WISDOM TOOTH EXTRACTION      There were no vitals filed for this visit.   Subjective Assessment - 02/01/21 1606     Subjective I feel bruised from the  needling the other day.    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 No/denies    Pain Score 0-No pain                               [facility] Adult PT Treatment/Exercise - 02/01/21 0001       Manual Therapy   Myofascial Release to cording in L axilla /upper arm- one very thick cord palpable in this area that did become less taut by end of session                          PT Long Term Goals - 02/01/21 1607       PT LONG TERM GOAL #1   Title Pt will demonstrate 160 degrees of left shoulder flexion to allow pt to reach overhead    Baseline 144; 02/01/21- 170    Time 4    Period Weeks    Status Achieved  PT LONG TERM GOAL #2   Title Pt will demonstratse 160 degrees of left shoulder abduction to allow pt to reach out to the sides    Baseline 128; 02/01/21- 156    Time 4    Period Weeks    Status On-going      PT LONG TERM GOAL #3   Title Pt will report a 75% improvement in tightness and discomfort in left lateral trunk with end range shoulder ROM for improved comfort.    Baseline 02/01/21- 50%    Time 4    Period Weeks    Status On-going      PT LONG TERM GOAL #4   Title Pt will be independent in a home exercise program for continued strengthening and stretching    Time 4    Period Weeks    Status On-going      PT LONG TERM GOAL #5   Title Pt will report no pull in the left UE with AROM due to cording    Baseline 01/04/21- pt still has cording in L axilla that causes tightness at end range; 9/28- pt still feels pulling due to cording     Time 4    Period Weeks    Status On-going                   Plan - 02/01/21 1657     Clinical Impression Statement Pt reports that her cording is slowly improving with manual therapy and dry needling. Assessed pt's progress towards goals in therapy today and she met her L shoulder flexion goal and is progressing towards her abduction goal. She is still feeling tightness at end range of motion and has discomfort from the cording. One very thick cord is still palpable from scar in to axilla and down upper arm. Pt would benefit from additional skilled PT services to continue to decrease tightness and discomfort from cording.    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    PT Home Exercise Plan [id number]    Consulted and Agree with Plan of Care Patient             Patient will benefit from skilled therapeutic intervention in order to improve the following deficits and impairments:  Decreased range of motion, Postural dysfunction, Decreased scar mobility, Impaired UE functional use, Increased fascial restricitons, Decreased strength  Visit Diagnosis: Stiffness of left shoulder, not elsewhere classified  Abnormal posture  Aftercare following surgery for neoplasm  Disorder of the skin and subcutaneous tissue related to radiation, unspecified  Malignant neoplasm of lower-inner quadrant of left breast in female, estrogen receptor positive ([place])     Problem List Patient Active Problem List   Diagnosis Date Noted   S/P breast reconstruction, bilateral 07/21/2019   Acquired absence of breast 05/22/2019   Breast cancer ([place])  05/14/2019   Malignant neoplasm of lower-inner quadrant of left breast in female, estrogen receptor positive ([place]) 03/24/2019   Allergic dermatitis 12/27/2017   Reactive airway disease without complication 00/86/7619   Allergic rhinitis 11/28/2016   Hyperlipidemia 11/28/2016    Manus Gunning, PT 02/01/2021, 5:00 PM  [place] [address]  [place], [place], [address] Phone: [phone]   Fax:  [phone]  Name: Devita Nies MRN: [id number] Date of Birth: Sep 26, 1970   Manus Gunning, PT 02/01/21 5:00 PM

## 2021-02-02 ENCOUNTER — Ambulatory Visit: Payer: BC Managed Care – PPO | Admitting: Physical Therapy

## 2021-02-07 ENCOUNTER — Other Ambulatory Visit: Payer: Self-pay

## 2021-02-07 ENCOUNTER — Ambulatory Visit: Payer: BC Managed Care – PPO | Attending: General Surgery | Admitting: Physical Therapy

## 2021-02-07 DIAGNOSIS — L599 Disorder of the skin and subcutaneous tissue related to radiation, unspecified: Secondary | ICD-10-CM | POA: Diagnosis present

## 2021-02-07 DIAGNOSIS — Z483 Aftercare following surgery for neoplasm: Secondary | ICD-10-CM | POA: Insufficient documentation

## 2021-02-07 DIAGNOSIS — R293 Abnormal posture: Secondary | ICD-10-CM | POA: Diagnosis present

## 2021-02-07 DIAGNOSIS — M25612 Stiffness of left shoulder, not elsewhere classified: Secondary | ICD-10-CM | POA: Insufficient documentation

## 2021-02-07 NOTE — Therapy (Signed)
[place], [place], [address] Phone: [phone]   Fax:  [phone]  Physical Therapy Treatment  Patient Details  Name: Alice Davis MRN: [id number] Date of Birth: 07/14/1970 Referring Provider (PT): Joaquin Courts Date: 02/07/2021   PT End of Session - 02/07/21 1633     Visit Number 15    Number of Visits 22    Date for PT Re-Evaluation 03/01/21    PT Start Time 1633             Past Medical History:  Diagnosis Date   Asthma    Cancer ([place])     Past Surgical History:  Procedure Laterality Date   BREAST RECONSTRUCTION WITH PLACEMENT OF TISSUE EXPANDER AND FLEX HD (ACELLULAR HYDRATED DERMIS) Bilateral 05/14/2019   Procedure: IMMEDIATE BREAST RECONSTRUCTION WITH PLACEMENT OF TISSUE EXPANDER AND FLEX HD (ACELLULAR HYDRATED DERMIS);  Surgeon: Wallace Going, DO;  Location: [place];  Service: Plastics;  Laterality: Bilateral;   NASAL SINUS SURGERY     20 years ago   NIPPLE SPARING MASTECTOMY WITH SENTINEL LYMPH NODE BIOPSY Bilateral 05/14/2019   Procedure: BILATERAL NIPPLE SPARING MASTECTOMIES WITH LEFT SENTINEL LYMPH NODE BIOPSY;  Surgeon: [name], MD;  Location: [place];  Service: General;  Laterality: Bilateral;  PEC   REMOVAL OF BILATERAL TISSUE EXPANDERS WITH PLACEMENT OF BILATERAL BREAST IMPLANTS Bilateral 07/13/2019   Procedure: REMOVAL OF BILATERAL TISSUE EXPANDERS WITH PLACEMENT OF BILATERAL BREAST IMPLANTS;  Surgeon: Wallace Going, DO;  Location: [place];  Service: Plastics;  Laterality: Bilateral;   RHINOPLASTY     20 years   WISDOM TOOTH EXTRACTION      There were no vitals filed for this visit.   Subjective Assessment - 02/07/21 1635     Subjective "I am doing better in the upper part of the arm but it still feels foreign/ plastic in the axilla."    Patient Stated Goals to get better ROM and decrease tightness                [facility] PT  Assessment - 02/07/21 0001       Assessment   Medical Diagnosis left breast cancer    Referring Provider (PT) Lenell Antu Adult PT Treatment/Exercise:  Therapeutic Exercise: N/a  Manual Therapy: Skilled palpation and monitoring of pt during TPDN IASTM along the L flank and pec minor and teres minor Gentle rib springing along the L mid to lower ribs  Neuromuscular re-ed: N/A  Therapeutic Activity: N/A  Modalities: N/A  Self Care: N/A  Consider / progression for next session:         Trigger Point Dry Needling - 02/07/21 0001     Other Dry Needling cording along the ribs    Pectoralis Minor Response Twitch response elicited;Palpable increased muscle length                        PT Long Term Goals - 02/01/21 1607       PT LONG TERM GOAL #1   Title Pt will demonstrate 160 degrees of left shoulder flexion to allow pt to reach overhead    Baseline 144; 02/01/21- 170    Time 4    Period Weeks    Status Achieved      PT LONG TERM GOAL #2  Title Pt will demonstratse 160 degrees of left shoulder abduction to allow pt to reach out to the sides    Baseline 128; 02/01/21- 156    Time 4    Period Weeks    Status On-going      PT LONG TERM GOAL #3   Title Pt will report a 75% improvement in tightness and discomfort in left lateral trunk with end range shoulder ROM for improved comfort.    Baseline 02/01/21- 50%    Time 4    Period Weeks    Status On-going      PT LONG TERM GOAL #4   Title Pt will be independent in a home exercise program for continued strengthening and stretching    Time 4    Period Weeks    Status On-going      PT LONG TERM GOAL #5   Title Pt will report no pull in the left UE with AROM due to cording    Baseline 01/04/21- pt still has cording in L axilla that causes tightness at end range; 9/28- pt still feels pulling due to cording    Time 4    Period Weeks    Status On-going                    Plan - 02/07/21 1729     Clinical Impression Statement [name] reports she is doing better in the proximal shoulder noting more of the tension being along the L flank. continued TPDN working on pec minor and cording followed with IASTM techniques. Trialed desensitazation techniques to reduce the report of abnormal sensation. She continues to report no pain, and reports her shoulder mobilty feels like it is improving and making progress.    PT Treatment/Interventions ADLs/Self Care Home Management;Manual techniques;Patient/family education;Therapeutic exercise;Passive range of motion;Manual lymph drainage;Vasopneumatic Device;Scar mobilization;Dry needling    PT Next Visit Plan pec stretches, L shoulder ROM, MFR to cording in L axilla - response to DN, anterior GHJ PA, scapular mobs.    PT Home Exercise Plan [id number]    Consulted and Agree with Plan of Care Patient             Patient will benefit from skilled therapeutic intervention in order to improve the following deficits and impairments:  Decreased range of motion, Postural dysfunction, Decreased scar mobility, Impaired UE functional use, Increased fascial restricitons, Decreased strength  Visit Diagnosis: Stiffness of left shoulder, not elsewhere classified  Abnormal posture     Problem List Patient Active Problem List   Diagnosis Date Noted   S/P breast reconstruction, bilateral 07/21/2019   Acquired absence of breast 05/22/2019   Breast cancer ([place]) 05/14/2019   Malignant neoplasm of lower-inner quadrant of left breast in female, estrogen receptor positive ([place]) 03/24/2019   Allergic dermatitis 12/27/2017   Reactive airway disease without complication 56/43/3295   Allergic rhinitis 11/28/2016   Hyperlipidemia 11/28/2016    [name], PT 02/07/2021, 5:39 PM  [facility] [address] [place], [place], [address] Phone: [phone]   Fax:   [phone]  Name: Alice Davis MRN: [id number] Date of Birth: 1971/03/02

## 2021-02-13 ENCOUNTER — Other Ambulatory Visit: Payer: Self-pay

## 2021-02-13 ENCOUNTER — Ambulatory Visit: Payer: BC Managed Care – PPO | Admitting: Physical Therapy

## 2021-02-13 DIAGNOSIS — M25612 Stiffness of left shoulder, not elsewhere classified: Secondary | ICD-10-CM | POA: Diagnosis not present

## 2021-02-13 DIAGNOSIS — R293 Abnormal posture: Secondary | ICD-10-CM

## 2021-02-13 NOTE — Therapy (Signed)
[place], [place], [address] Phone: [phone]   Fax:  [phone]  Physical Therapy Treatment  Patient Details  Name: Alice Davis MRN: [id number] Date of Birth: 1971/02/12 Referring Provider (PT): Joaquin Courts Date: 02/13/2021   PT End of Session - 02/13/21 1719     Visit Number 16    Number of Visits 22    Date for PT Re-Evaluation 03/01/21    PT Start Time 1633    PT Stop Time 1715    PT Time Calculation (min) 42 min    Activity Tolerance Patient tolerated treatment well    Behavior During Therapy [facility] for tasks assessed/performed             Past Medical History:  Diagnosis Date   Asthma    Cancer ([place])     Past Surgical History:  Procedure Laterality Date   BREAST RECONSTRUCTION WITH PLACEMENT OF TISSUE EXPANDER AND FLEX HD (ACELLULAR HYDRATED DERMIS) Bilateral 05/14/2019   Procedure: IMMEDIATE BREAST RECONSTRUCTION WITH PLACEMENT OF TISSUE EXPANDER AND FLEX HD (ACELLULAR HYDRATED DERMIS);  Surgeon: Wallace Going, DO;  Location: [place];  Service: Plastics;  Laterality: Bilateral;   NASAL SINUS SURGERY     20 years ago   NIPPLE SPARING MASTECTOMY WITH SENTINEL LYMPH NODE BIOPSY Bilateral 05/14/2019   Procedure: BILATERAL NIPPLE SPARING MASTECTOMIES WITH LEFT SENTINEL LYMPH NODE BIOPSY;  Surgeon: [name], MD;  Location: [place];  Service: General;  Laterality: Bilateral;  PEC   REMOVAL OF BILATERAL TISSUE EXPANDERS WITH PLACEMENT OF BILATERAL BREAST IMPLANTS Bilateral 07/13/2019   Procedure: REMOVAL OF BILATERAL TISSUE EXPANDERS WITH PLACEMENT OF BILATERAL BREAST IMPLANTS;  Surgeon: Wallace Going, DO;  Location: [place];  Service: Plastics;  Laterality: Bilateral;   RHINOPLASTY     20 years   WISDOM TOOTH EXTRACTION      There were no vitals filed for this visit.   Subjective Assessment - 02/13/21 1650     Subjective "doing alittle better but feel  like the issue is the arm pit."    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, grade 2, margins negative, 1/3 lymph node positive ER 80-95 %, PR 80-95 %, HER-2 negative, Ki-67 2-20%right mastectomy: Benign; then bil implant exchange July 13, 2019, pt completed radiation 09/25/19, pt currently tamoxifen                [facility] PT Assessment - 02/13/21 0001       Assessment   Medical Diagnosis left breast cancer                          [facility] Adult PT Treatment/Exercise:  Therapeutic Exercise: N/A  Manual Therapy:  Skilled palpation and monitoring of pt during TPDN IASTM along the axiall and pec major/ minor Grade III L rib springing Thoracic grade IV mob with multiple cavitations noted  Neuromuscular re-ed: N/A  Therapeutic Activity: N/A  Modalities: N/A  Self Care: N/A  Consider / progression for next session:    Trigger Point Dry Needling - 02/13/21 0001     Consent Given? Yes    Education Handout Provided Previously provided    Other Dry Needling cording along the ribs    Pectoralis Major Response Palpable increased muscle length;Twitch response elicited  PT Long Term Goals - 02/01/21 1607       PT LONG TERM GOAL #1   Title Pt will demonstrate 160 degrees of left shoulder flexion to allow pt to reach overhead    Baseline 144; 02/01/21- 170    Time 4    Period Weeks    Status Achieved      PT LONG TERM GOAL #2   Title Pt will demonstratse 160 degrees of left shoulder abduction to allow pt to reach out to the sides    Baseline 128; 02/01/21- 156    Time 4    Period Weeks    Status On-going      PT LONG TERM GOAL #3   Title Pt will report a 75% improvement in tightness and discomfort in left lateral trunk with end range shoulder ROM for improved comfort.    Baseline 02/01/21- 50%    Time 4    Period  Weeks    Status On-going      PT LONG TERM GOAL #4   Title Pt will be independent in a home exercise program for continued strengthening and stretching    Time 4    Period Weeks    Status On-going      PT LONG TERM GOAL #5   Title Pt will report no pull in the left UE with AROM due to cording    Baseline 01/04/21- pt still has cording in L axilla that causes tightness at end range; 9/28- pt still feels pulling due to cording    Time 4    Period Weeks    Status On-going                   Plan - 02/13/21 1719     Clinical Impression Statement pt reports most of her cording appars mostly in the Axilla. Continued working on DN for the pec minor and along the cording, followed with IASTM focusing in the axilla which she did report increased soreness during with relief of tension following. Worked today on L rib springing and thoracic mobilization with caviation noted. Following session she noted decreased pain    PT Treatment/Interventions ADLs/Self Care Home Management;Manual techniques;Patient/family education;Therapeutic exercise;Passive range of motion;Manual lymph drainage;Vasopneumatic Device;Scar mobilization;Dry needling    PT Next Visit Plan pec stretches, L shoulder ROM, MFR to cording in L axilla - response to DN, anterior GHJ PA, scapular mobs.    PT Home Exercise Plan [id number]    Consulted and Agree with Plan of Care Patient             Patient will benefit from skilled therapeutic intervention in order to improve the following deficits and impairments:  Decreased range of motion, Postural dysfunction, Decreased scar mobility, Impaired UE functional use, Increased fascial restricitons, Decreased strength  Visit Diagnosis: Stiffness of left shoulder, not elsewhere classified  Abnormal posture     Problem List Patient Active Problem List   Diagnosis Date Noted   S/P breast reconstruction, bilateral 07/21/2019   Acquired absence of breast 05/22/2019    Breast cancer ([place]) 05/14/2019   Malignant neoplasm of lower-inner quadrant of left breast in female, estrogen receptor positive ([place]) 03/24/2019   Allergic dermatitis 12/27/2017   Reactive airway disease without complication 26/94/8546   Allergic rhinitis 11/28/2016   Hyperlipidemia 11/28/2016   [name] PT, DPT, LAT, ATC  02/13/21  5:23 PM      [facility] [address] [place], [place], [address] Phone:  [phone]   Fax:  [phone]  Name: Alice Davis MRN: [id number] Date of Birth: 08-Jan-1971

## 2021-02-15 ENCOUNTER — Ambulatory Visit: Payer: BC Managed Care – PPO | Attending: Radiation Oncology | Admitting: Physical Therapy

## 2021-02-15 ENCOUNTER — Encounter: Payer: Self-pay | Admitting: Physical Therapy

## 2021-02-15 ENCOUNTER — Other Ambulatory Visit: Payer: Self-pay

## 2021-02-15 DIAGNOSIS — R293 Abnormal posture: Secondary | ICD-10-CM | POA: Diagnosis present

## 2021-02-15 DIAGNOSIS — M25612 Stiffness of left shoulder, not elsewhere classified: Secondary | ICD-10-CM | POA: Insufficient documentation

## 2021-02-15 NOTE — Therapy (Signed)
[place] @ [place], [place], [address] Phone: [phone]   Fax:  [phone]  Physical Therapy Treatment  Patient Details  Name: Alice Davis MRN: [id number] Date of Birth: 1970-10-06 Referring Provider (PT): Joaquin Courts Date: 02/15/2021   PT End of Session - 02/15/21 1658     Visit Number 17    Number of Visits 22    Date for PT Re-Evaluation 03/01/21    PT Start Time 1604    PT Stop Time 1653    PT Time Calculation (min) 49 min    Activity Tolerance Patient tolerated treatment well    Behavior During Therapy [facility] for tasks assessed/performed             Past Medical History:  Diagnosis Date   Asthma    Cancer ([place])     Past Surgical History:  Procedure Laterality Date   BREAST RECONSTRUCTION WITH PLACEMENT OF TISSUE EXPANDER AND FLEX HD (ACELLULAR HYDRATED DERMIS) Bilateral 05/14/2019   Procedure: IMMEDIATE BREAST RECONSTRUCTION WITH PLACEMENT OF TISSUE EXPANDER AND FLEX HD (ACELLULAR HYDRATED DERMIS);  Surgeon: Wallace Going, DO;  Location: [place];  Service: Plastics;  Laterality: Bilateral;   NASAL SINUS SURGERY     20 years ago   NIPPLE SPARING MASTECTOMY WITH SENTINEL LYMPH NODE BIOPSY Bilateral 05/14/2019   Procedure: BILATERAL NIPPLE SPARING MASTECTOMIES WITH LEFT SENTINEL LYMPH NODE BIOPSY;  Surgeon: [name], MD;  Location: [place];  Service: General;  Laterality: Bilateral;  PEC   REMOVAL OF BILATERAL TISSUE EXPANDERS WITH PLACEMENT OF BILATERAL BREAST IMPLANTS Bilateral 07/13/2019   Procedure: REMOVAL OF BILATERAL TISSUE EXPANDERS WITH PLACEMENT OF BILATERAL BREAST IMPLANTS;  Surgeon: Wallace Going, DO;  Location: [place];  Service: Plastics;  Laterality: Bilateral;   RHINOPLASTY     20 years   WISDOM TOOTH EXTRACTION      There were no vitals filed for this visit.   Subjective Assessment - 02/15/21 1605     Subjective I did needling the  other day so it feels a little bruised. I still feel a little tight but it is getting better.    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 No/denies    Pain Score 0-No pain                               [facility] Adult PT Treatment/Exercise - 02/15/21 0001       Manual Therapy   Myofascial Release to cording in L axilla /upper arm- one very thick cord palpable in this area that did become less taut by end of session                          PT Long Term Goals - 02/01/21 1607       PT LONG TERM GOAL #1   Title Pt will demonstrate 160 degrees of left shoulder flexion to allow pt to reach overhead    Baseline 144; 02/01/21- 170  Time 4    Period Weeks    Status Achieved      PT LONG TERM GOAL #2   Title Pt will demonstratse 160 degrees of left shoulder abduction to allow pt to reach out to the sides    Baseline 128; 02/01/21- 156    Time 4    Period Weeks    Status On-going      PT LONG TERM GOAL #3   Title Pt will report a 75% improvement in tightness and discomfort in left lateral trunk with end range shoulder ROM for improved comfort.    Baseline 02/01/21- 50%    Time 4    Period Weeks    Status On-going      PT LONG TERM GOAL #4   Title Pt will be independent in a home exercise program for continued strengthening and stretching    Time 4    Period Weeks    Status On-going      PT LONG TERM GOAL #5   Title Pt will report no pull in the left UE with AROM due to cording    Baseline 01/04/21- pt still has cording in L axilla that causes  tightness at end range; 9/28- pt still feels pulling due to cording    Time 4    Period Weeks    Status On-going                   Plan - 02/15/21 1658     Clinical Impression Statement Continued with myofascial release to cording in L axilla. The cording is becoming more centralized in her axilla and there is less extending down her arm. The cord that remains is thick but is becoming less taut to palpation since she began therapy. By the end of the session there is notable improvement in tightness of the cord.    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    Consulted and Agree with Plan of Care Patient             Patient will benefit from skilled therapeutic intervention in order to improve the following deficits and impairments:  Decreased range of motion, Postural dysfunction, Decreased scar mobility, Impaired UE functional use, Increased fascial restricitons, Decreased strength  Visit Diagnosis: Stiffness of left shoulder, not elsewhere classified  Abnormal posture     Problem List Patient Active Problem List   Diagnosis Date Noted   S/P breast reconstruction, bilateral 07/21/2019   Acquired absence of breast 05/22/2019   Breast cancer ([place]) 05/14/2019   Malignant neoplasm of lower-inner quadrant of left breast in female, estrogen receptor positive ([place]) 03/24/2019   Allergic dermatitis 12/27/2017   Reactive airway disease without complication 01/60/1093   Allergic rhinitis 11/28/2016   Hyperlipidemia 11/28/2016    Manus Gunning, PT 02/15/2021, 5:00 PM  [place] @ [place], [place], [address] Phone: [phone]   Fax:  [phone]  Name: [place] Davis MRN: [id number] Date of Birth: 1971-02-29  Manus Gunning, PT 02/15/21 5:00 PM

## 2021-02-20 ENCOUNTER — Ambulatory Visit: Payer: BC Managed Care – PPO | Admitting: Physical Therapy

## 2021-02-20 ENCOUNTER — Encounter: Payer: Self-pay | Admitting: Physical Therapy

## 2021-02-20 ENCOUNTER — Other Ambulatory Visit: Payer: Self-pay

## 2021-02-20 DIAGNOSIS — M25612 Stiffness of left shoulder, not elsewhere classified: Secondary | ICD-10-CM

## 2021-02-20 DIAGNOSIS — R293 Abnormal posture: Secondary | ICD-10-CM

## 2021-02-20 NOTE — Therapy (Signed)
[facility] @ [place], [place], [address] Phone: [phone]   Fax:  [phone]  Physical Therapy Treatment  Patient Details  Name: Alice Davis MRN: [id number] Date of Birth: 1970-08-07 Referring Provider (PT): Joaquin Courts Date: 02/20/2021   PT End of Session - 02/20/21 1659     Visit Number 18    Number of Visits 22    Date for PT Re-Evaluation 03/01/21    PT Start Time 3299    PT Stop Time 1654    PT Time Calculation (min) 49 min    Activity Tolerance Patient tolerated treatment well    Behavior During Therapy [facility] for tasks assessed/performed             Past Medical History:  Diagnosis Date   Asthma    Cancer ([place])     Past Surgical History:  Procedure Laterality Date   BREAST RECONSTRUCTION WITH PLACEMENT OF TISSUE EXPANDER AND FLEX HD (ACELLULAR HYDRATED DERMIS) Bilateral 05/14/2019   Procedure: IMMEDIATE BREAST RECONSTRUCTION WITH PLACEMENT OF TISSUE EXPANDER AND FLEX HD (ACELLULAR HYDRATED DERMIS);  Surgeon: Wallace Going, DO;  Location: [facility];  Service: Plastics;  Laterality: Bilateral;   NASAL SINUS SURGERY     20 years ago   NIPPLE SPARING MASTECTOMY WITH SENTINEL LYMPH NODE BIOPSY Bilateral 05/14/2019   Procedure: BILATERAL NIPPLE SPARING MASTECTOMIES WITH LEFT SENTINEL LYMPH NODE BIOPSY;  Surgeon: [name], MD;  Location: [facility];  Service: General;  Laterality: Bilateral;  PEC   REMOVAL OF BILATERAL TISSUE EXPANDERS WITH PLACEMENT OF BILATERAL BREAST IMPLANTS Bilateral 07/13/2019   Procedure: REMOVAL OF BILATERAL TISSUE EXPANDERS WITH PLACEMENT OF BILATERAL BREAST IMPLANTS;  Surgeon: Wallace Going, DO;  Location: [facility] [facility];  Service: Plastics;  Laterality: Bilateral;   RHINOPLASTY     20 years   WISDOM TOOTH EXTRACTION      There were no vitals filed for this visit.   Subjective Assessment - 02/20/21 1659     Subjective It seems more tight  today. I wish it would stay improved.    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 No/denies    Pain Score 0-No pain                               [facility] Adult PT Treatment/Exercise - 02/20/21 0001       Manual Therapy   Myofascial Release to cording in L axilla /upper arm- one very thick cord palpable from axilla down towards antecubital fossa that became minimally improved with MFR                          PT Long Term Goals - 02/01/21 1607       PT LONG TERM GOAL #1   Title Pt will demonstrate 160 degrees of left shoulder flexion to allow pt to reach overhead    Baseline 144; 02/01/21- 170    Time 4    Period Weeks  Status Achieved      PT LONG TERM GOAL #2   Title Pt will demonstratse 160 degrees of left shoulder abduction to allow pt to reach out to the sides    Baseline 128; 02/01/21- 156    Time 4    Period Weeks    Status On-going      PT LONG TERM GOAL #3   Title Pt will report a 75% improvement in tightness and discomfort in left lateral trunk with end range shoulder ROM for improved comfort.    Baseline 02/01/21- 50%    Time 4    Period Weeks    Status On-going      PT LONG TERM GOAL #4   Title Pt will be independent in a home exercise program for continued strengthening and stretching    Time 4    Period Weeks    Status On-going      PT LONG TERM GOAL #5   Title Pt will report no pull in the left UE with AROM due to cording    Baseline 01/04/21- pt still has cording in L axilla that causes tightness at end range; 9/28-  pt still feels pulling due to cording    Time 4    Period Weeks    Status On-going                   Plan - 02/20/21 1700     Clinical Impression Statement Continued with myofascial release to cording today in L axilla. Pt reports she has tightness and discomfort due to the cording. She attempts stretching but reports it continues to tighten up. She does not have a dry needling appointment this week due to therapist vacation. Last session she came after dry needling and her cording did not seem as taut and did not extend as far down. Continued with myofascial release to axillary cording.    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    PT Home Exercise Plan [id number]    Consulted and Agree with Plan of Care Patient             Patient will benefit from skilled therapeutic intervention in order to improve the following deficits and impairments:  Decreased range of motion, Postural dysfunction, Decreased scar mobility, Impaired UE functional use, Increased fascial restricitons, Decreased strength  Visit Diagnosis: Stiffness of left shoulder, not elsewhere classified  Abnormal posture     Problem List Patient Active Problem List   Diagnosis Date Noted   S/P breast reconstruction, bilateral 07/21/2019   Acquired absence of breast 05/22/2019   Breast cancer ([place]) 05/14/2019   Malignant neoplasm of lower-inner quadrant of left breast in female, estrogen receptor positive ([place]) 03/24/2019   Allergic dermatitis 12/27/2017   Reactive airway disease without complication 70/35/0093   Allergic rhinitis 11/28/2016    Hyperlipidemia 11/28/2016    Manus Gunning, PT 02/20/2021, 5:03 PM  [facility] @ [place], [place], [address] Phone: [phone]   Fax:  [phone]  Name: Alice Davis MRN: [id number] Date of Birth: Oct 10, 1970   Manus Gunning, PT 02/20/21 5:03 PM

## 2021-02-22 ENCOUNTER — Ambulatory Visit: Payer: BC Managed Care – PPO | Admitting: Physical Therapy

## 2021-02-22 ENCOUNTER — Encounter: Payer: Self-pay | Admitting: Physical Therapy

## 2021-02-22 ENCOUNTER — Other Ambulatory Visit: Payer: Self-pay

## 2021-02-22 DIAGNOSIS — R293 Abnormal posture: Secondary | ICD-10-CM

## 2021-02-22 DIAGNOSIS — M25612 Stiffness of left shoulder, not elsewhere classified: Secondary | ICD-10-CM

## 2021-02-22 NOTE — Therapy (Signed)
[facility] @ [place], [place], [address] Phone: [phone]   Fax:  [phone]  Physical Therapy Treatment  Patient Details  Name: Alice Davis MRN: [id number] Date of Birth: 1970-07-12 Referring Provider (PT): Joaquin Courts Date: 02/22/2021   PT End of Session - 02/22/21 1653     Visit Number 19    Number of Visits 22    Date for PT Re-Evaluation 03/01/21    PT Start Time 1608    PT Stop Time 1653    PT Time Calculation (min) 45 min    Activity Tolerance Patient tolerated treatment well    Behavior During Therapy [facility] for tasks assessed/performed             Past Medical History:  Diagnosis Date   Asthma    Cancer ([place])     Past Surgical History:  Procedure Laterality Date   BREAST RECONSTRUCTION WITH PLACEMENT OF TISSUE EXPANDER AND FLEX HD (ACELLULAR HYDRATED DERMIS) Bilateral 05/14/2019   Procedure: IMMEDIATE BREAST RECONSTRUCTION WITH PLACEMENT OF TISSUE EXPANDER AND FLEX HD (ACELLULAR HYDRATED DERMIS);  Surgeon: Wallace Going, DO;  Location: [facility];  Service: Plastics;  Laterality: Bilateral;   NASAL SINUS SURGERY     20 years ago   NIPPLE SPARING MASTECTOMY WITH SENTINEL LYMPH NODE BIOPSY Bilateral 05/14/2019   Procedure: BILATERAL NIPPLE SPARING MASTECTOMIES WITH LEFT SENTINEL LYMPH NODE BIOPSY;  Surgeon: [name], MD;  Location: [facility];  Service: General;  Laterality: Bilateral;  PEC   REMOVAL OF BILATERAL TISSUE EXPANDERS WITH PLACEMENT OF BILATERAL BREAST IMPLANTS Bilateral 07/13/2019   Procedure: REMOVAL OF BILATERAL TISSUE EXPANDERS WITH PLACEMENT OF BILATERAL BREAST IMPLANTS;  Surgeon: Wallace Going, DO;  Location: [facility];  Service: Plastics;  Laterality: Bilateral;   RHINOPLASTY     20 years   WISDOM TOOTH EXTRACTION      There were no vitals filed for this visit.   Subjective Assessment - 02/22/21 1610     Subjective I felt better after  last session but then it got tight again.    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 No/denies    Pain Score 0-No pain                               [facility] Adult PT Treatment/Exercise - 02/22/21 0001       Manual Therapy   Myofascial Release to cording in L axilla /upper arm- one very thick cord palpable from axilla down towards antecubital fossa that became minimally improved with MFR                          PT Long Term Goals - 02/01/21 1607       PT LONG TERM GOAL #1   Title Pt will demonstrate 160 degrees of left shoulder flexion to allow pt to reach overhead    Baseline 144; 02/01/21- 170    Time 4    Period Weeks  Status Achieved      PT LONG TERM GOAL #2   Title Pt will demonstratse 160 degrees of left shoulder abduction to allow pt to reach out to the sides    Baseline 128; 02/01/21- 156    Time 4    Period Weeks    Status On-going      PT LONG TERM GOAL #3   Title Pt will report a 75% improvement in tightness and discomfort in left lateral trunk with end range shoulder ROM for improved comfort.    Baseline 02/01/21- 50%    Time 4    Period Weeks    Status On-going      PT LONG TERM GOAL #4   Title Pt will be independent in a home exercise program for continued strengthening and stretching    Time 4    Period Weeks    Status On-going      PT LONG TERM GOAL #5   Title Pt will report no pull in the left UE with AROM due to cording    Baseline 01/04/21- pt still has cording in L axilla that causes tightness at end range;  9/28- pt still feels pulling due to cording    Time 4    Period Weeks    Status On-going                   Plan - 02/22/21 1653     Clinical Impression Statement Pt still presents with cording from axilla extending down in to upper arm though cord at upper arm is slighly less palpable than it was at last session. Continued with myofascial release to cording with improvements noted and cording becoming less palpable.    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    PT Home Exercise Plan [id number]    Consulted and Agree with Plan of Care Patient             Patient will benefit from skilled therapeutic intervention in order to improve the following deficits and impairments:  Decreased range of motion, Postural dysfunction, Decreased scar mobility, Impaired UE functional use, Increased fascial restricitons, Decreased strength  Visit Diagnosis: Stiffness of left shoulder, not elsewhere classified  Abnormal posture     Problem List Patient Active Problem List   Diagnosis Date Noted   S/P breast reconstruction, bilateral 07/21/2019   Acquired absence of breast 05/22/2019   Breast cancer ([place]) 05/14/2019   Malignant neoplasm of lower-inner quadrant of left breast in female, estrogen receptor positive ([place]) 03/24/2019   Allergic dermatitis 12/27/2017   Reactive airway disease without complication 49/20/1007   Allergic rhinitis 11/28/2016   Hyperlipidemia 11/28/2016    Manus Gunning, PT 02/22/2021, 5:03 PM  [facility] @ [place],  [place], [address] Phone: [phone]   Fax:  [phone]  Name: Alice Davis MRN: [id number] Date of Birth: March 11, 1971   Manus Gunning, PT 02/22/21 5:04 PM

## 2021-02-27 ENCOUNTER — Encounter: Payer: BC Managed Care – PPO | Admitting: Physical Therapy

## 2021-03-01 ENCOUNTER — Encounter: Payer: Self-pay | Admitting: Physical Therapy

## 2021-03-01 ENCOUNTER — Ambulatory Visit: Payer: BC Managed Care – PPO | Admitting: Physical Therapy

## 2021-03-01 ENCOUNTER — Other Ambulatory Visit: Payer: Self-pay

## 2021-03-01 ENCOUNTER — Encounter: Payer: BC Managed Care – PPO | Admitting: Physical Therapy

## 2021-03-01 DIAGNOSIS — M25612 Stiffness of left shoulder, not elsewhere classified: Secondary | ICD-10-CM | POA: Diagnosis not present

## 2021-03-01 DIAGNOSIS — R293 Abnormal posture: Secondary | ICD-10-CM

## 2021-03-01 DIAGNOSIS — Z483 Aftercare following surgery for neoplasm: Secondary | ICD-10-CM

## 2021-03-01 NOTE — Therapy (Signed)
[facility] [place], [place], [address] Phone: [phone]   Fax:  [phone]  Physical Therapy Treatment  Patient Details  Name: Alice Davis MRN: [id number] Date of Birth: [birth year] Referring Provider (PT): Joaquin Courts Date: 03/01/2021   PT End of Session - 03/01/21 1712     Visit Number 20    Number of Visits 22    Date for PT Re-Evaluation 03/01/21    PT Start Time 6195    PT Stop Time 1716    PT Time Calculation (min) 45 min    Activity Tolerance Patient tolerated treatment well    Behavior During Therapy [facility] for tasks assessed/performed             Past Medical History:  Diagnosis Date   Asthma    Cancer ([place])     Past Surgical History:  Procedure Laterality Date   BREAST RECONSTRUCTION WITH PLACEMENT OF TISSUE EXPANDER AND FLEX HD (ACELLULAR HYDRATED DERMIS) Bilateral 05/14/2019   Procedure: IMMEDIATE BREAST RECONSTRUCTION WITH PLACEMENT OF TISSUE EXPANDER AND FLEX HD (ACELLULAR HYDRATED DERMIS);  Surgeon: Wallace Going, DO;  Location: [facility];  Service: Plastics;  Laterality: Bilateral;   NASAL SINUS SURGERY     20 years ago   NIPPLE SPARING MASTECTOMY WITH SENTINEL LYMPH NODE BIOPSY Bilateral 05/14/2019   Procedure: BILATERAL NIPPLE SPARING MASTECTOMIES WITH LEFT SENTINEL LYMPH NODE BIOPSY;  Surgeon: [name], MD;  Location: [facility];  Service: General;  Laterality: Bilateral;  PEC   REMOVAL OF BILATERAL TISSUE EXPANDERS WITH PLACEMENT OF BILATERAL BREAST IMPLANTS Bilateral 07/13/2019   Procedure: REMOVAL OF BILATERAL TISSUE EXPANDERS WITH PLACEMENT OF BILATERAL BREAST IMPLANTS;  Surgeon: Wallace Going, DO;  Location: [facility];  Service: Plastics;  Laterality: Bilateral;   RHINOPLASTY     20 years   WISDOM TOOTH EXTRACTION      There were no vitals filed for this visit.   Subjective Assessment - 03/01/21 1636     Subjective "doing well but got stiff  again, it seems like it just goes back."                [facility] PT Assessment - 03/01/21 0001       Assessment   Medical Diagnosis left breast cancer                               [facility] Adult PT Treatment/Exercise:  Therapeutic Exercise: Thoracic extension over bolster with hands behind head and elbows adducted.  2 x 5 combined with deep breathing in with extension and out with flexion Quadruped pos with RUE behind head focusing on thoracic rotation 1 x 10 Pec stretch R only  Manual Therapy:  Skilled palpation and monitoring of pt during TPDN IASTM along the L axilla and upper arm Myfascial stretch and cross friction massage  Neuromuscular re-ed: N/A  Therapeutic Activity: N/A  Modalities: N/A  Self Care: N/A  Consider / progression for next session:           PT Long Term Goals - 02/01/21 1607       PT LONG TERM GOAL #1   Title Pt will demonstrate 160 degrees of left shoulder flexion to allow pt to reach overhead    Baseline 144; 02/01/21- 170    Time 4    Period Weeks    Status Achieved      PT LONG TERM  GOAL #2   Title Pt will demonstratse 160 degrees of left shoulder abduction to allow pt to reach out to the sides    Baseline 128; 02/01/21- 156    Time 4    Period Weeks    Status On-going      PT LONG TERM GOAL #3   Title Pt will report a 75% improvement in tightness and discomfort in left lateral trunk with end range shoulder ROM for improved comfort.    Baseline 02/01/21- 50%    Time 4    Period Weeks    Status On-going      PT LONG TERM GOAL #4   Title Pt will be independent in a home exercise program for continued strengthening and stretching    Time 4    Period Weeks    Status On-going      PT LONG TERM GOAL #5   Title Pt will report no pull in the left UE with AROM due to cording    Baseline 01/04/21- pt still has cording in L axilla that causes tightness at end range; 9/28- pt still feels pulling due to  cording    Time 4    Period Weeks    Status On-going                   Plan - 03/01/21 1725     Clinical Impression Statement pt arrives reporting increase in cording in the axilla and into the upper arm. Continued TPDN focusing on cording followed with IASTM techniques and myofascial release techniques. Worked on promoting thoracic mobility and pec stretching. Following session she reported decreased tension but cotninued stiffness.    PT Treatment/Interventions ADLs/Self Care Home Management;Manual techniques;Patient/family education;Therapeutic exercise;Passive range of motion;Manual lymph drainage;Vasopneumatic Device;Scar mobilization;Dry needling    PT Next Visit Plan ERO, pec stretches, L shoulder ROM, MFR to cording in L axilla - response to DN, anterior GHJ PA, scapular mobs.    Consulted and Agree with Plan of Care Patient             Patient will benefit from skilled therapeutic intervention in order to improve the following deficits and impairments:  Decreased range of motion, Postural dysfunction, Decreased scar mobility, Impaired UE functional use, Increased fascial restricitons, Decreased strength  Visit Diagnosis: Stiffness of left shoulder, not elsewhere classified  Abnormal posture  Aftercare following surgery for neoplasm     Problem List Patient Active Problem List   Diagnosis Date Noted   S/P breast reconstruction, bilateral 07/21/2019   Acquired absence of breast 05/22/2019   Breast cancer ([place]) 05/14/2019   Malignant neoplasm of lower-inner quadrant of left breast in female, estrogen receptor positive ([place]) 03/24/2019   Allergic dermatitis 12/27/2017   Reactive airway disease without complication 96/29/5284   Allergic rhinitis 11/28/2016   Hyperlipidemia 11/28/2016    [name] PT, DPT, LAT, ATC  03/01/21  5:32 PM     [facility] [address] [place], [place],  [address] Phone: [phone]   Fax:  [phone]  Name: Bruna Dills MRN: [id number] Date of Birth: [birth year]

## 2021-03-06 ENCOUNTER — Encounter: Payer: Self-pay | Admitting: Physical Therapy

## 2021-03-06 ENCOUNTER — Other Ambulatory Visit: Payer: Self-pay

## 2021-03-06 ENCOUNTER — Ambulatory Visit: Payer: BC Managed Care – PPO | Admitting: Physical Therapy

## 2021-03-06 DIAGNOSIS — M25612 Stiffness of left shoulder, not elsewhere classified: Secondary | ICD-10-CM

## 2021-03-06 DIAGNOSIS — Z483 Aftercare following surgery for neoplasm: Secondary | ICD-10-CM

## 2021-03-06 DIAGNOSIS — R293 Abnormal posture: Secondary | ICD-10-CM

## 2021-03-06 DIAGNOSIS — L599 Disorder of the skin and subcutaneous tissue related to radiation, unspecified: Secondary | ICD-10-CM

## 2021-03-06 NOTE — Therapy (Signed)
[place], [place], [address] Phone: [phone]   Fax:  [phone]  Physical Therapy Treatment  Patient Details  Name: Alice Davis MRN: [id number] Date of Birth: [birth year] Referring Provider (PT): Joaquin Courts Date: 03/06/2021   PT End of Session - 03/06/21 1631     Visit Number 21    Number of Visits 22    Date for PT Re-Evaluation 04/24/21    PT Start Time 1636    PT Stop Time 1717    PT Time Calculation (min) 41 min    Activity Tolerance Patient tolerated treatment well    Behavior During Therapy [facility] for tasks assessed/performed             Past Medical History:  Diagnosis Date   Asthma    Cancer ([place])     Past Surgical History:  Procedure Laterality Date   BREAST RECONSTRUCTION WITH PLACEMENT OF TISSUE EXPANDER AND FLEX HD (ACELLULAR HYDRATED DERMIS) Bilateral 05/14/2019   Procedure: IMMEDIATE BREAST RECONSTRUCTION WITH PLACEMENT OF TISSUE EXPANDER AND FLEX HD (ACELLULAR HYDRATED DERMIS);  Surgeon: Wallace Going, DO;  Location: [place];  Service: Plastics;  Laterality: Bilateral;   NASAL SINUS SURGERY     20 years ago   NIPPLE SPARING MASTECTOMY WITH SENTINEL LYMPH NODE BIOPSY Bilateral 05/14/2019   Procedure: BILATERAL NIPPLE SPARING MASTECTOMIES WITH LEFT SENTINEL LYMPH NODE BIOPSY;  Surgeon: [name], MD;  Location: [facility];  Service: General;  Laterality: Bilateral;  PEC   REMOVAL OF BILATERAL TISSUE EXPANDERS WITH PLACEMENT OF BILATERAL BREAST IMPLANTS Bilateral 07/13/2019   Procedure: REMOVAL OF BILATERAL TISSUE EXPANDERS WITH PLACEMENT OF BILATERAL BREAST IMPLANTS;  Surgeon: Wallace Going, DO;  Location: [facility];  Service: Plastics;  Laterality: Bilateral;   RHINOPLASTY     20 years   WISDOM TOOTH EXTRACTION      There were no vitals filed for this visit.   Subjective Assessment - 03/06/21 1637     Subjective " still having the stiffness /  cording in the L axilla, but no pain"    Patient Stated Goals to get better ROM and decrease tightness    Currently in Pain? No/denies                [facility] PT Assessment - 03/06/21 0001       Assessment   Medical Diagnosis left breast cancer    Referring Provider (PT) Marlou Starks      AROM   Left Shoulder ABduction 140 Degrees                                         PT Long Term Goals - 03/06/21 1639       PT LONG TERM GOAL #1   Title Pt will demonstrate 160 degrees of left shoulder flexion to allow pt to reach overhead    Status Achieved    Target Date 04/17/21      PT LONG TERM GOAL #2   Title Pt will demonstratse 160 degrees of left shoulder abduction to allow pt to reach out to the sides    Baseline 03/06/2021 140    Period Weeks    Target Date 04/17/21      PT LONG TERM GOAL #3   Title Pt will report a 75% improvement in tightness and discomfort in left lateral trunk with  end range shoulder ROM for improved comfort.    Baseline 03/06/2021 55%    Period Weeks    Status On-going    Target Date 04/17/21      PT LONG TERM GOAL #4   Title Pt will be independent in a home exercise program for continued strengthening and stretching    Period Weeks    Status On-going    Target Date 04/17/21      PT LONG TERM GOAL #5   Title Pt will report no pull in the left UE with AROM due to cording    Baseline 03/06/2021 continued cording in the L axilla    Time 6    Period Weeks    Status New    Target Date 04/17/21                   Plan - 03/06/21 1730     Clinical Impression Statement Mrs Vangieson reports improvement overal with physical therapy increased l shoulder AROM and additionally rpeorts no pain. Over the course of the last few visits she reports her progress feels as it has slowed some. She is making progress toward her goals but continues to demo limitation with L shoulder abuction. cotninued TPDN focusing on the L axilla/  cording and along the infraspinatus and teres minor followed with IASTM techniques and thoracic mobs. She would benefit from continued physical therapy to promote strengthening, scapular stability, stretching, and maximize her function by addressing the deficists listed.    PT Frequency 2x / week    PT Duration --   7 weeks   PT Treatment/Interventions ADLs/Self Care Home Management;Manual techniques;Patient/family education;Therapeutic exercise;Passive range of motion;Manual lymph drainage;Vasopneumatic Device;Scar mobilization;Dry needling    PT Next Visit Plan pec stretches, L shoulder ROM, MFR to cording in L axilla - response to DN, anterior GHJ PA, scapular mobs. posterior chaing strengthening, lower trap/ suprapsinatus activation    Consulted and Agree with Plan of Care Patient             Patient will benefit from skilled therapeutic intervention in order to improve the following deficits and impairments:  Decreased range of motion, Postural dysfunction, Decreased scar mobility, Impaired UE functional use, Increased fascial restricitons, Decreased strength  Visit Diagnosis: Stiffness of left shoulder, not elsewhere classified  Abnormal posture  Aftercare following surgery for neoplasm  Disorder of the skin and subcutaneous tissue related to radiation, unspecified     Problem List Patient Active Problem List   Diagnosis Date Noted   S/P breast reconstruction, bilateral 07/21/2019   Acquired absence of breast 05/22/2019   Breast cancer ([place]) 05/14/2019   Malignant neoplasm of lower-inner quadrant of left breast in female, estrogen receptor positive ([place]) 03/24/2019   Allergic dermatitis 12/27/2017   Reactive airway disease without complication 99/77/4142   Allergic rhinitis 11/28/2016   Hyperlipidemia 11/28/2016    [name], PT 03/06/2021, 5:36 PM  [facility] [address] [place], [place],  [address] Phone: [phone]   Fax:  [phone]  Name: Alice Davis MRN: [id number] Date of Birth: 1970/12/25

## 2021-03-08 ENCOUNTER — Ambulatory Visit: Payer: BC Managed Care – PPO | Attending: Radiation Oncology | Admitting: Physical Therapy

## 2021-03-08 ENCOUNTER — Other Ambulatory Visit: Payer: Self-pay

## 2021-03-08 DIAGNOSIS — M25612 Stiffness of left shoulder, not elsewhere classified: Secondary | ICD-10-CM | POA: Insufficient documentation

## 2021-03-08 DIAGNOSIS — R293 Abnormal posture: Secondary | ICD-10-CM | POA: Diagnosis present

## 2021-03-08 NOTE — Therapy (Signed)
[place], [place], [address] Phone: [phone]   Fax:  [phone]  Physical Therapy Treatment  Patient Details  Name: Alice Davis MRN: [id number] Date of Birth: 07-25-1970 Referring Provider (PT): Joaquin Courts Date: 03/08/2021   PT End of Session - 03/08/21 1704     Visit Number 22    Number of Visits 35    Date for PT Re-Evaluation 04/24/21    PT Start Time 1630    PT Stop Time 0947    PT Time Calculation (min) 45 min    Activity Tolerance Patient tolerated treatment well    Behavior During Therapy [facility] for tasks assessed/performed             Past Medical History:  Diagnosis Date   Asthma    Cancer ([place])     Past Surgical History:  Procedure Laterality Date   BREAST RECONSTRUCTION WITH PLACEMENT OF TISSUE EXPANDER AND FLEX HD (ACELLULAR HYDRATED DERMIS) Bilateral 05/14/2019   Procedure: IMMEDIATE BREAST RECONSTRUCTION WITH PLACEMENT OF TISSUE EXPANDER AND FLEX HD (ACELLULAR HYDRATED DERMIS);  Surgeon: Wallace Going, DO;  Location: [place];  Service: Plastics;  Laterality: Bilateral;   NASAL SINUS SURGERY     20 years ago   NIPPLE SPARING MASTECTOMY WITH SENTINEL LYMPH NODE BIOPSY Bilateral 05/14/2019   Procedure: BILATERAL NIPPLE SPARING MASTECTOMIES WITH LEFT SENTINEL LYMPH NODE BIOPSY;  Surgeon: [name], MD;  Location: [place];  Service: General;  Laterality: Bilateral;  PEC   REMOVAL OF BILATERAL TISSUE EXPANDERS WITH PLACEMENT OF BILATERAL BREAST IMPLANTS Bilateral 07/13/2019   Procedure: REMOVAL OF BILATERAL TISSUE EXPANDERS WITH PLACEMENT OF BILATERAL BREAST IMPLANTS;  Surgeon: Wallace Going, DO;  Location: [place];  Service: Plastics;  Laterality: Bilateral;   RHINOPLASTY     20 years   WISDOM TOOTH EXTRACTION      There were no vitals filed for this visit.   Subjective Assessment - 03/08/21 1633     Subjective " I am doing okay, I don't feel  as bruisy as usual"    Patient Stated Goals to get better ROM and decrease tightness    Currently in Pain? No/denies                        [facility] Adult PT Treatment/Exercise:  Therapeutic Exercise: Rhythmic stabilization 3 x 30 sec LUE only Sustained GHJ horizontal abduction bil with pilates ring combined with flexion/ extension 2 x 12 Double ER with scapular retraction 2 x 10 with RTB Horizontal abduction 2 x 10 with RTB  Manual Therapy:  Skilled palpation and monitoring of pt during TPDN IASTM techniques along the pec major/ minor lGHJ inferior mobs grade III working shoulder into available end range motion With some crossfiction massage at end range  Neuromuscular re-ed: N/A  Therapeutic Activity: N/A  Modalities: N/A  Self Care: N/A  Consider / progression for next session:          Trigger Point Dry Needling - 03/08/21 0001     Consent Given? Yes    Education Handout Provided Previously provided    Pectoralis Major Response Twitch response elicited;Palpable increased muscle length                   PT Education - 03/08/21 1731     Education Details updated HEP today to included posterior shoulder strengthening.    Person(s) Educated Patient    Methods  Explanation;Verbal cues;Handout    Comprehension Verbalized understanding;Verbal cues required                 PT Long Term Goals - 03/06/21 1639       PT LONG TERM GOAL #1   Title Pt will demonstrate 160 degrees of left shoulder flexion to allow pt to reach overhead    Status Achieved    Target Date 04/17/21      PT LONG TERM GOAL #2   Title Pt will demonstratse 160 degrees of left shoulder abduction to allow pt to reach out to the sides    Baseline 03/06/2021 140    Period Weeks    Target Date 04/17/21      PT LONG TERM GOAL #3   Title Pt will report a 75% improvement in tightness and discomfort in left lateral trunk with end range shoulder ROM for improved  comfort.    Baseline 03/06/2021 55%    Period Weeks    Status On-going    Target Date 04/17/21      PT LONG TERM GOAL #4   Title Pt will be independent in a home exercise program for continued strengthening and stretching    Period Weeks    Status On-going    Target Date 04/17/21      PT LONG TERM GOAL #5   Title Pt will report no pull in the left UE with AROM due to cording    Baseline 03/06/2021 continued cording in the L axilla    Time 6    Period Weeks    Status New    Target Date 04/17/21                   Plan - 03/08/21 1728     Clinical Impression Statement pt arrives reporting tighness mostly located along the pec major/ minor. Cotninued TPDN focusing on along the pec major/ minor followed with IASTM techniques. Worked on inferior capsule stretching with GHJ inferior mobs which pt noted reproduction of concordant symptoms during. She did well with posterior shoulder strengthening with focused on scapulohumeral rhythm. End of session she did reported that the pec did feel more like " putty" and that she was feeling looser.    PT Treatment/Interventions ADLs/Self Care Home Management;Manual techniques;Patient/family education;Therapeutic exercise;Passive range of motion;Manual lymph drainage;Vasopneumatic Device;Scar mobilization;Dry needling    PT Next Visit Plan pec stretches, L shoulder ROM, MFR to cording in L axilla - response to DN, anterior GHJ PA, scapular mobs. posterior chaing strengthening, lower trap/ suprapsinatus activation    PT Home Exercise Plan [id number]    Consulted and Agree with Plan of Care Patient             Patient will benefit from skilled therapeutic intervention in order to improve the following deficits and impairments:  Decreased range of motion, Postural dysfunction, Decreased scar mobility, Impaired UE functional use, Increased fascial restricitons, Decreased strength  Visit Diagnosis: Stiffness of left shoulder, not elsewhere  classified  Abnormal posture     Problem List Patient Active Problem List   Diagnosis Date Noted   S/P breast reconstruction, bilateral 07/21/2019   Acquired absence of breast 05/22/2019   Breast cancer ([place]) 05/14/2019   Malignant neoplasm of lower-inner quadrant of left breast in female, estrogen receptor positive ([place]) 03/24/2019   Allergic dermatitis 12/27/2017   Reactive airway disease without complication 02/40/9735   Allergic rhinitis 11/28/2016   Hyperlipidemia 11/28/2016   Takima Encina PT, DPT, LAT, ATC  03/08/21  5:36 PM      [place], [place], [address] Phone: [phone]   Fax:  [phone]  Name: Alice Davis MRN: [id number] Date of Birth: 03-May-1971

## 2021-03-08 NOTE — Patient Instructions (Signed)
Access Code: [email or web address] URL: [email or web address] Date: 03/08/2021 Prepared by: [name]  Program Notes The first 5 are what we have been doing in clinic and the last 3 are extra things that would be beneficial using the roller.   Remember to raise the distance with pillows or towels or just don't use the foam roller if the stretch is too aggressive.    Exercises Thoracic Foam Roll Mobilization Backstroke - 3 x weekly - 1 sets - 10 reps Supine Static Chest Stretch on Foam Roll - 3 x weekly - 1 sets - 10 reps Thoracic Y on Foam Roll - 3 x weekly - 1 sets - 10 reps Open Book Chest Rotation Stretch on Foam 1/2 Roll - 3 x weekly - 1 sets - 10 reps Hooklying Scapular Protraction on Foam Roll - 3 x weekly - 1 sets - 10 reps Thoracic Extension with Foam Roll - 3 x weekly - 10-20 second hold Latissimus Mobilization on Foam Roll - 3 x weekly Thoracic Extension Mobilization on Foam Roll - 3 x weekly - 3-5 reps - 5-10 second hold Scapular retraction with ER (MONEY) - 1 x daily - 7 x weekly - 2 sets - 10 reps Sidelying Shoulder Abduction Full Range of Motion - 1 x daily - 7 x weekly - 2 sets - 5 reps Seated Shoulder Horizontal Abduction with Resistance - 1 x daily - 7 x weekly - 2 sets - 10 reps Supine Bilateral Shoulder Protraction - 1 x daily - 7 x weekly - 2 sets - 10 reps

## 2021-03-12 IMAGING — US US BREAST*L* LIMITED INC AXILLA
1 series · 6 of 6 positions shown · non-contrast
Comparison: Previous exam(s).

CLINICAL DATA: Status post bilateral mastectomy and implant
reconstruction in May 2019 for LEFT breast cancer. Palpable
abnormality along the inferior aspect of the LEFT breast.

EXAM:
ULTRASOUND OF THE LEFT BREAST

[Series 1: us breast*left* limited inc axilla · 0.06mm/px · 6 of 6 slices shown]
[im 1/6]
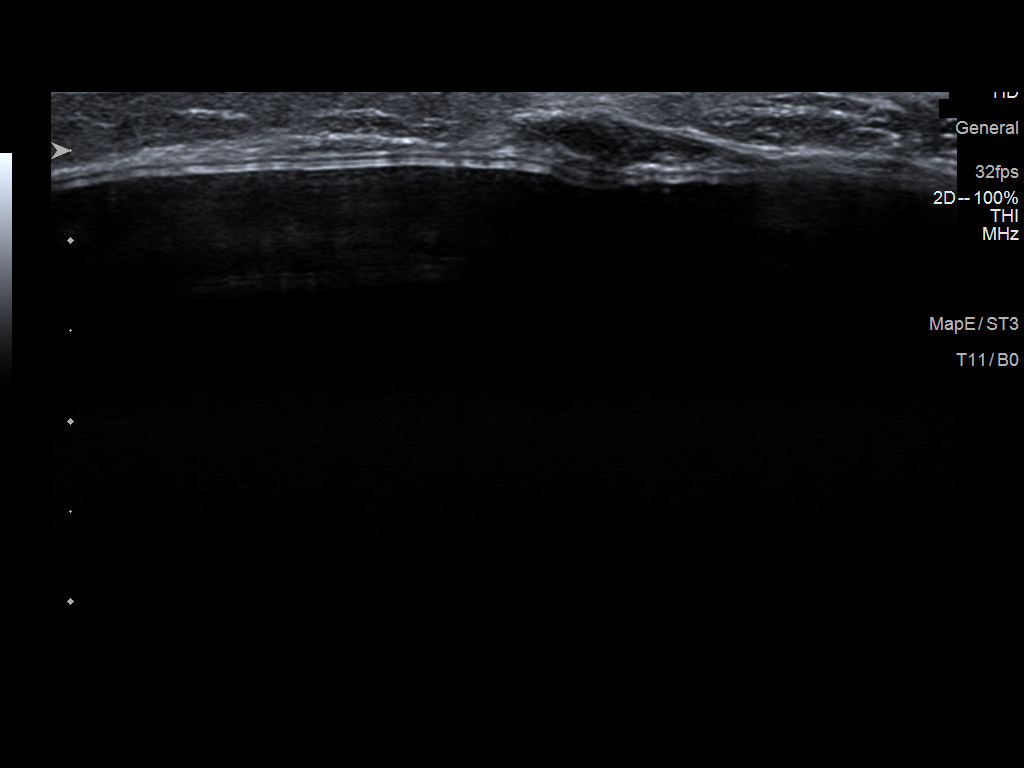
[im 2/6]
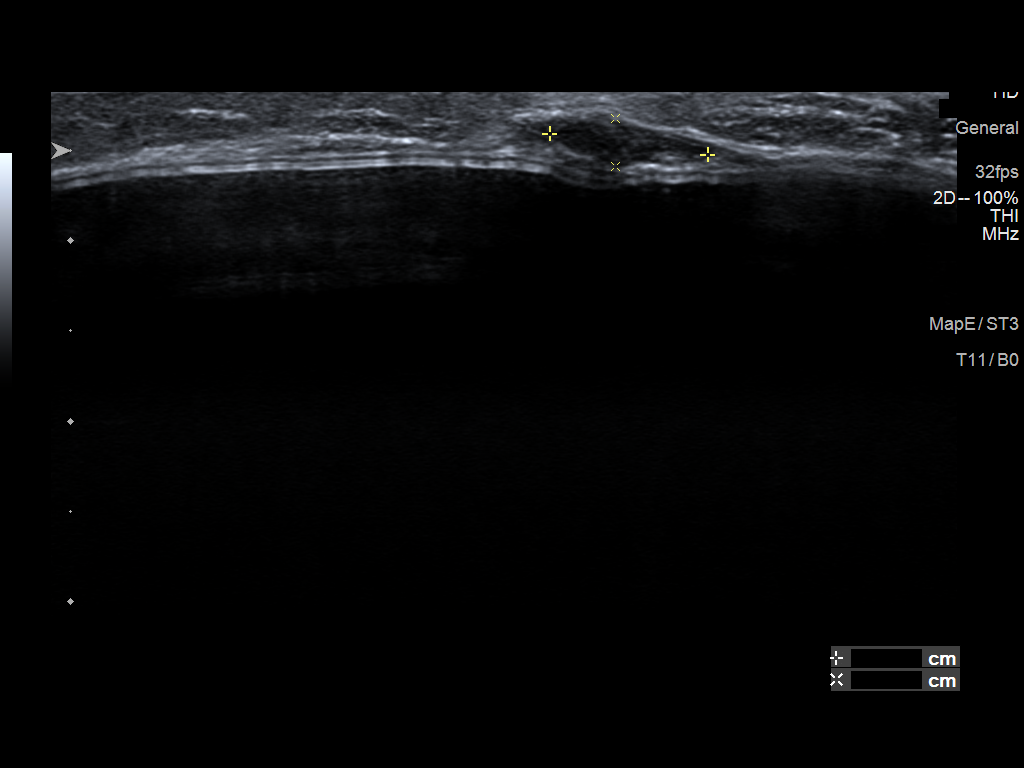
[im 3/6]
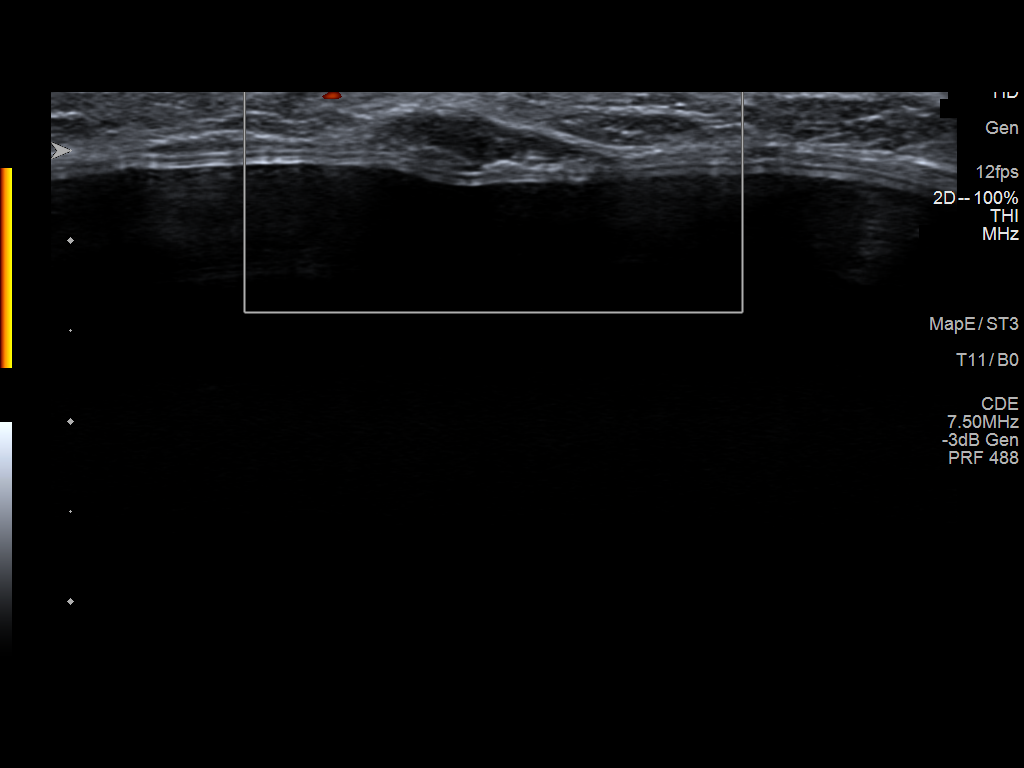
[im 4/6]
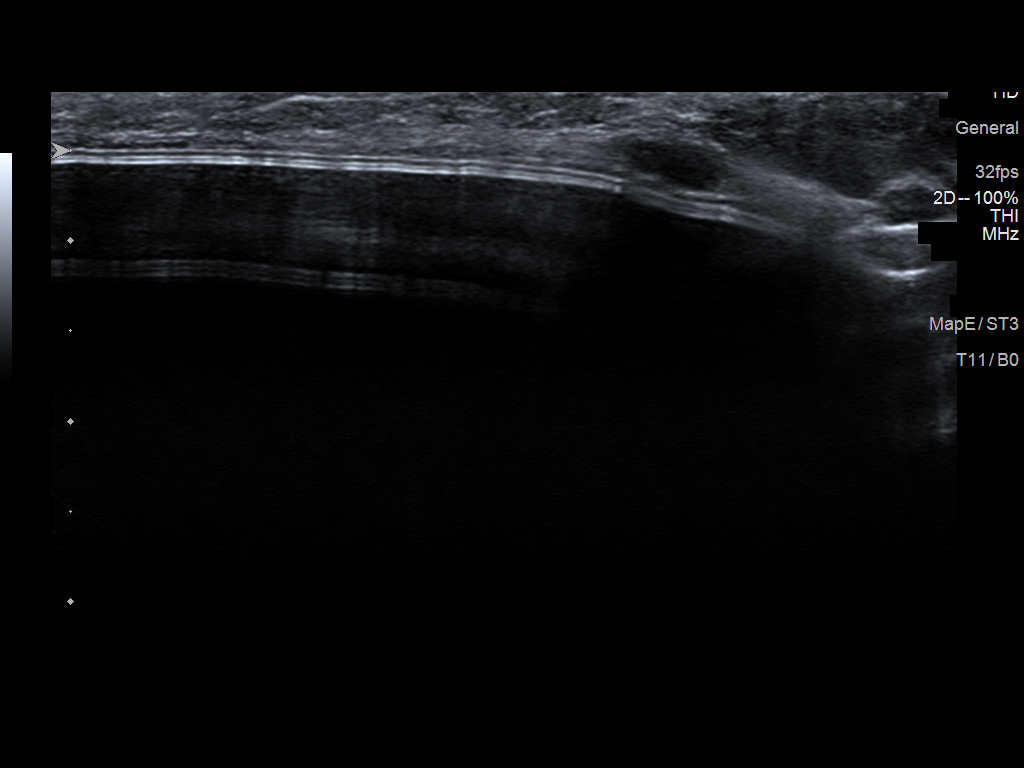
[im 5/6]
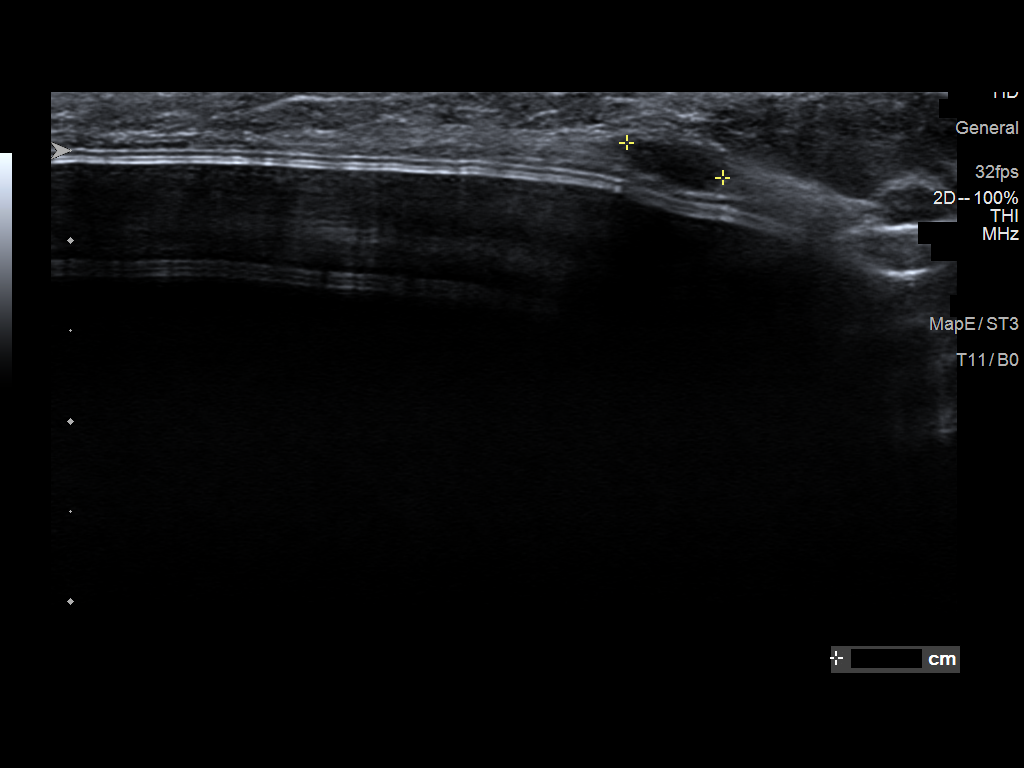
[im 6/6]
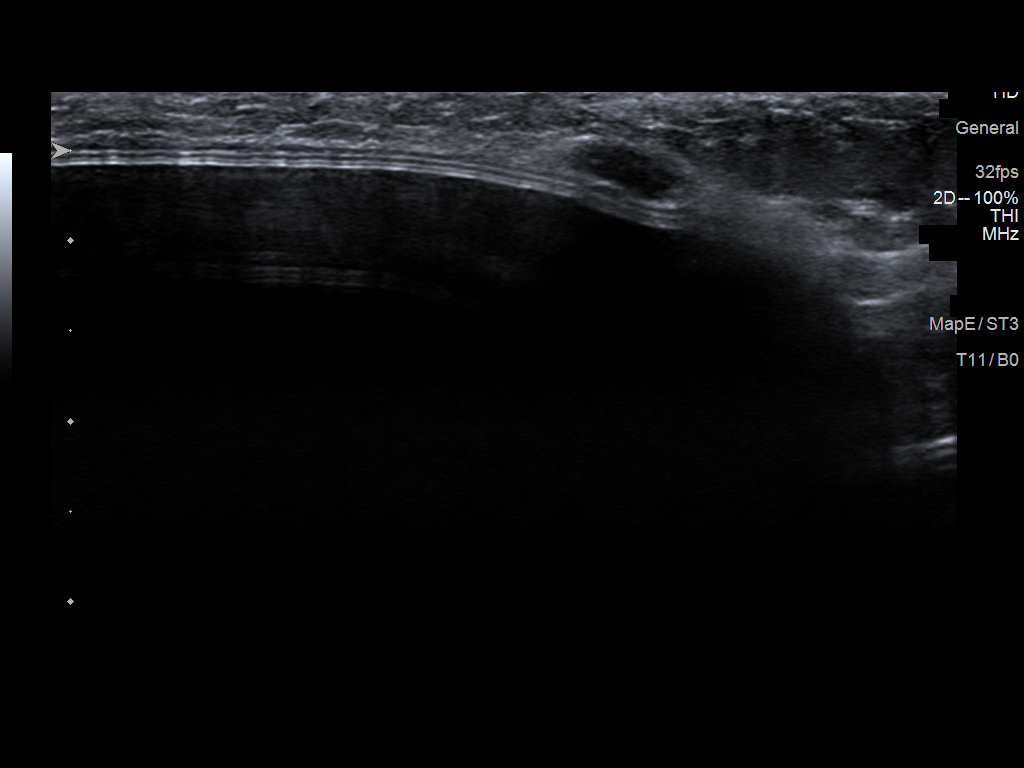

[6 of 6 positions shown; findings below may reference images not displayed]

FINDINGS: On physical exam, I palpate discrete superficial mass along the
inframammary fold of the LEFT breast.

Targeted ultrasound is performed, showing an oval hypoechoic mass
with indistinct margins in the 6:30 o'clock location of LEFT breast
8 centimeters from the nipple which measures 0.9 x 0.3 x
centimeters. Mass is superficial to the implant. There is no
internal blood flow on Doppler evaluation.

Evaluation of the LEFT axilla shows no adenopathy.
IMPRESSION: Indeterminate oval mass in the area of patient's concern in the 6:30
o'clock location of the LEFT breast.

RECOMMENDATION:
Recommend ultrasound-guided core biopsy of palpable LEFT breast
mass.

I have discussed the findings and recommendations with the patient.
If applicable, a reminder letter will be sent to the patient
regarding the next appointment.

BI-RADS CATEGORY  4: Suspicious.

## 2021-03-15 ENCOUNTER — Encounter: Payer: Self-pay | Admitting: Physical Therapy

## 2021-03-15 ENCOUNTER — Other Ambulatory Visit: Payer: Self-pay

## 2021-03-15 ENCOUNTER — Ambulatory Visit: Payer: BC Managed Care – PPO | Admitting: Physical Therapy

## 2021-03-15 DIAGNOSIS — M25612 Stiffness of left shoulder, not elsewhere classified: Secondary | ICD-10-CM

## 2021-03-15 DIAGNOSIS — R293 Abnormal posture: Secondary | ICD-10-CM

## 2021-03-15 NOTE — Therapy (Signed)
[place], [place], [address] Phone: [phone]   Fax:  [phone]  Physical Therapy Treatment  Patient Details  Name: Alice Davis MRN: [id number] Date of Birth: [birth year] Referring Provider (PT): Joaquin Courts Date: 03/15/2021   PT End of Session - 03/15/21 1635     Visit Number 23    Number of Visits 35    Date for PT Re-Evaluation 04/24/21    PT Start Time 1633    Activity Tolerance Patient tolerated treatment well             Past Medical History:  Diagnosis Date   Asthma    Cancer [facility])     Past Surgical History:  Procedure Laterality Date   BREAST RECONSTRUCTION WITH PLACEMENT OF TISSUE EXPANDER AND FLEX HD (ACELLULAR HYDRATED DERMIS) Bilateral 05/14/2019   Procedure: IMMEDIATE BREAST RECONSTRUCTION WITH PLACEMENT OF TISSUE EXPANDER AND FLEX HD (ACELLULAR HYDRATED DERMIS);  Surgeon: Wallace Going, DO;  Location: [place];  Service: Plastics;  Laterality: Bilateral;   NASAL SINUS SURGERY     20 years ago   NIPPLE SPARING MASTECTOMY WITH SENTINEL LYMPH NODE BIOPSY Bilateral 05/14/2019   Procedure: BILATERAL NIPPLE SPARING MASTECTOMIES WITH LEFT SENTINEL LYMPH NODE BIOPSY;  Surgeon: [name], MD;  Location: [place];  Service: General;  Laterality: Bilateral;  PEC   REMOVAL OF BILATERAL TISSUE EXPANDERS WITH PLACEMENT OF BILATERAL BREAST IMPLANTS Bilateral 07/13/2019   Procedure: REMOVAL OF BILATERAL TISSUE EXPANDERS WITH PLACEMENT OF BILATERAL BREAST IMPLANTS;  Surgeon: Wallace Going, DO;  Location: [place];  Service: Plastics;  Laterality: Bilateral;   RHINOPLASTY     20 years   WISDOM TOOTH EXTRACTION      There were no vitals filed for this visit.   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 No/denies                              [facility] Adult PT Treatment/Exercise:  Therapeutic Exercise: Scapular retraction with double ER 2 x 12 with GTB Supine horizontal abduction 2 x 12 with GTB Ceiling punches 2 x 12 with pilates ring with bil iosmetric horizontal add Lower trap wall y's 2 x 12 Wall circles CW/CCW 1 x 20 ea   Manual Therapy: GHJ inferior/ anteror mobs grade IV IASTM along the L axilla proximal bicep and R flank  Neuromuscular re-ed: N/A  Therapeutic Activity: N/A  Modalities: N/A  Self Care: N/A  Consider / progression for next session:                PT Long Term Goals - 03/06/21 1639       PT LONG TERM GOAL #1   Title Pt will demonstrate 160 degrees of left shoulder flexion to allow pt to reach overhead    Status Achieved    Target Date 04/17/21  PT LONG TERM GOAL #2   Title Pt will demonstratse 160 degrees of left shoulder abduction to allow pt to reach out to the sides    Baseline 03/06/2021 140    Period Weeks    Target Date 04/17/21      PT LONG TERM GOAL #3   Title Pt will report a 75% improvement in tightness and discomfort in left lateral trunk with end range shoulder ROM for improved comfort.    Baseline 03/06/2021 55%    Period Weeks    Status On-going    Target Date 04/17/21      PT LONG TERM GOAL #4   Title Pt will be independent in a home exercise program for continued strengthening and stretching    Period Weeks    Status On-going    Target Date 04/17/21      PT LONG TERM GOAL #5   Title Pt will report no pull in the left UE with  AROM due to cording    Baseline 03/06/2021 continued cording in the L axilla    Time 6    Period Weeks    Status New    Target Date 04/17/21                   Plan - 03/15/21 1718     Clinical Impression Statement Alice Davis reports she feels like she has made some progress since the last session with reduced cording but is unsure as to what made the difference. Held off on TPDN today and continued IASTM techniques, shoulder GHJ mobs and scapular stability strengthening. Overall she did very well but did continue to remark about cording.    PT Treatment/Interventions ADLs/Self Care Home Management;Manual techniques;Patient/family education;Therapeutic exercise;Passive range of motion;Manual lymph drainage;Vasopneumatic Device;Scar mobilization;Dry needling    PT Next Visit Plan pec stretches, L shoulder ROM, MFR to cording in L axilla - response to DN, anterior GHJ PA, scapular mobs. posterior chaing strengthening, lower trap/ suprapsinatus activation    Consulted and Agree with Plan of Care Patient             Patient will benefit from skilled therapeutic intervention in order to improve the following deficits and impairments:  Decreased range of motion, Postural dysfunction, Decreased scar mobility, Impaired UE functional use, Increased fascial restricitons, Decreased strength  Visit Diagnosis: Stiffness of left shoulder, not elsewhere classified  Abnormal posture     Problem List Patient Active Problem List   Diagnosis Date Noted   S/P breast reconstruction, bilateral 07/21/2019   Acquired absence of breast 05/22/2019   Breast cancer ([place]) 05/14/2019   Malignant neoplasm of lower-inner quadrant of left breast in female, estrogen receptor positive ([place]) 03/24/2019   Allergic dermatitis 12/27/2017   Reactive airway disease without complication 31/51/7616   Allergic rhinitis 11/28/2016   Hyperlipidemia 11/28/2016    Alice Davis PT, DPT, LAT, ATC  03/15/21   5:22 PM      [facility] [address] [place], [place], [address] Phone: [phone]   Fax:  [phone]  Name: Alice Davis MRN: [id number] Date of Birth: Feb 26, 1971

## 2021-03-20 ENCOUNTER — Ambulatory Visit: Payer: BC Managed Care – PPO

## 2021-03-20 ENCOUNTER — Ambulatory Visit: Payer: BC Managed Care – PPO | Attending: Radiation Oncology

## 2021-03-20 ENCOUNTER — Other Ambulatory Visit: Payer: Self-pay

## 2021-03-20 DIAGNOSIS — R293 Abnormal posture: Secondary | ICD-10-CM | POA: Insufficient documentation

## 2021-03-20 DIAGNOSIS — M25612 Stiffness of left shoulder, not elsewhere classified: Secondary | ICD-10-CM | POA: Insufficient documentation

## 2021-03-20 DIAGNOSIS — Z170 Estrogen receptor positive status [ER+]: Secondary | ICD-10-CM | POA: Insufficient documentation

## 2021-03-20 DIAGNOSIS — C50312 Malignant neoplasm of lower-inner quadrant of left female breast: Secondary | ICD-10-CM | POA: Insufficient documentation

## 2021-03-20 DIAGNOSIS — Z483 Aftercare following surgery for neoplasm: Secondary | ICD-10-CM | POA: Insufficient documentation

## 2021-03-20 DIAGNOSIS — L599 Disorder of the skin and subcutaneous tissue related to radiation, unspecified: Secondary | ICD-10-CM | POA: Insufficient documentation

## 2021-03-20 NOTE — Therapy (Signed)
[place] [facility] [place], [place], [address] Phone: [phone]   Fax:  [phone]  Physical Therapy Treatment  Patient Details  Name: Alice Davis MRN: [id number] Date of Birth: 10-21-1970 Referring Provider (PT): Joaquin Courts Date: 03/20/2021   PT End of Session - 03/20/21 1719     Visit Number 24    Number of Visits 35    Date for PT Re-Evaluation 04/24/21    Authorization - Number of Visits 60    PT Start Time 1606    PT Stop Time 1708    PT Time Calculation (min) 62 min    Activity Tolerance Patient tolerated treatment well    Behavior During Therapy [facility] for tasks assessed/performed             Past Medical History:  Diagnosis Date   Asthma    Cancer ([place])     Past Surgical History:  Procedure Laterality Date   BREAST RECONSTRUCTION WITH PLACEMENT OF TISSUE EXPANDER AND FLEX HD (ACELLULAR HYDRATED DERMIS) Bilateral 05/14/2019   Procedure: IMMEDIATE BREAST RECONSTRUCTION WITH PLACEMENT OF TISSUE EXPANDER AND FLEX HD (ACELLULAR HYDRATED DERMIS);  Surgeon: Wallace Going, DO;  Location: [place];  Service: Plastics;  Laterality: Bilateral;   NASAL SINUS SURGERY     20 years ago   NIPPLE SPARING MASTECTOMY WITH SENTINEL LYMPH NODE BIOPSY Bilateral 05/14/2019   Procedure: BILATERAL NIPPLE SPARING MASTECTOMIES WITH LEFT SENTINEL LYMPH NODE BIOPSY;  Surgeon: [name], MD;  Location: [place];  Service: General;  Laterality: Bilateral;  PEC   REMOVAL OF BILATERAL TISSUE EXPANDERS WITH PLACEMENT OF BILATERAL BREAST IMPLANTS Bilateral 07/13/2019   Procedure: REMOVAL OF BILATERAL TISSUE EXPANDERS WITH PLACEMENT OF BILATERAL BREAST IMPLANTS;  Surgeon: Wallace Going, DO;  Location: [place];  Service: Plastics;  Laterality: Bilateral;   RHINOPLASTY     20 years   WISDOM TOOTH EXTRACTION      There were no vitals filed for this visit.   Subjective Assessment - 03/20/21 1609      Subjective I feel like I have made progress.  I still feel the cording and I get sore in the axillary region.  He did the tools on me last time and I got bruised but in a few days I feel a little better.    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 No/denies    Pain Score 0-No pain    Pain Location Axilla    Pain Orientation Left    Pain Type Chronic pain    Pain Onset More than a month ago                    L-DEX FLOWSHEETS - 03/20/21 1700       L-DEX LYMPHEDEMA SCREENING   Measurement Type Unilateral    L-DEX MEASUREMENT EXTREMITY Upper Extremity    POSITION  Standing    DOMINANT SIDE Right    At Risk Side Left    BASELINE SCORE (UNILATERAL) 0.8    L-DEX SCORE (UNILATERAL) 0.3    VALUE  CHANGE (UNILAT) -0.5                       [facility] Adult PT Treatment/Exercise - 03/20/21 0001       Manual Therapy   Soft tissue mobilization STM to pecs, lats, serratus and medial scapula/UT in right SL    Myofascial Release to cording in L axilla /upper arm- several very thick cords palpable from axilla down towards antecubital fossa    Passive ROM PROM left shoulder during MFR for flexion, abduction, D2 flexion                         PT Long Term Goals - 03/06/21 1639       PT LONG TERM GOAL #1   Title Pt will demonstrate 160 degrees of left shoulder flexion to allow pt to reach overhead    Status Achieved    Target Date 04/17/21      PT LONG TERM GOAL #2   Title Pt will demonstratse 160 degrees of left shoulder abduction to allow pt to  reach out to the sides    Baseline 03/06/2021 140    Period Weeks    Target Date 04/17/21      PT LONG TERM GOAL #3   Title Pt will report a 75% improvement in tightness and discomfort in left lateral trunk with end range shoulder ROM for improved comfort.    Baseline 03/06/2021 55%    Period Weeks    Status On-going    Target Date 04/17/21      PT LONG TERM GOAL #4   Title Pt will be independent in a home exercise program for continued strengthening and stretching    Period Weeks    Status On-going    Target Date 04/17/21      PT LONG TERM GOAL #5   Title Pt will report no pull in the left UE with AROM due to cording    Baseline 03/06/2021 continued cording in the L axilla    Time 6    Period Weeks    Status New    Target Date 04/17/21                   Plan - 03/20/21 1721     Clinical Impression Statement Continued soft tissue mobilization in supine and SL, and MFR to cording and during PROM of left shoulder.  Pt with multiple twitch responses with SOFT tissue mobilization in serratus, lats, pecs.  Pt continues with several thick cords still present that did feel looser after treatment today.  Performed SOZO and pt is well in the green range.  She has 1 more left to reach her 2 year mark. She will continue therapy as scheduled with [place] and with Vania Rea for DN/IASTM.    Examination-Activity Limitations Reach Overhead    Stability/Clinical Decision Making Stable/Uncomplicated    Rehab Potential Good    PT Frequency 2x / week    PT Treatment/Interventions ADLs/Self Care Home Management;Manual techniques;Patient/family education;Therapeutic exercise;Passive range of motion;Manual lymph drainage;Vasopneumatic Device;Scar mobilization;Dry needling    PT Next Visit Plan pec stretches, L shoulder ROM, MFR to cording in L axilla - response to DN, anterior GHJ PA, scapular mobs. posterior chaing strengthening, lower trap/ suprapsinatus activation    PT Home Exercise Plan [email or web address]     Consulted and Agree with Plan of Care Patient  Patient will benefit from skilled therapeutic intervention in order to improve the following deficits and impairments:  Decreased range of motion, Postural dysfunction, Decreased scar mobility, Impaired UE functional use, Increased fascial restricitons, Decreased strength  Visit Diagnosis: Stiffness of left shoulder, not elsewhere classified  Abnormal posture     Problem List Patient Active Problem List   Diagnosis Date Noted   S/P breast reconstruction, bilateral 07/21/2019   Acquired absence of breast 05/22/2019   Breast cancer ([place]) 05/14/2019   Malignant neoplasm of lower-inner quadrant of left breast in female, estrogen receptor positive ([place]) 03/24/2019   Allergic dermatitis 12/27/2017   Reactive airway disease without complication 97/35/3299   Allergic rhinitis 11/28/2016   Hyperlipidemia 11/28/2016    Claris Pong, PT 03/20/2021, 5:26 PM  [place] [facility] [place], [place], [address] Phone: [phone]   Fax:  [phone]  Name: Vanda Waskey MRN: [id number] Date of Birth: 1970-08-29

## 2021-03-23 ENCOUNTER — Ambulatory Visit: Payer: BC Managed Care – PPO | Admitting: Physical Therapy

## 2021-03-23 ENCOUNTER — Other Ambulatory Visit: Payer: Self-pay

## 2021-03-23 ENCOUNTER — Encounter: Payer: Self-pay | Admitting: Physical Therapy

## 2021-03-23 DIAGNOSIS — M25612 Stiffness of left shoulder, not elsewhere classified: Secondary | ICD-10-CM

## 2021-03-23 DIAGNOSIS — R293 Abnormal posture: Secondary | ICD-10-CM

## 2021-03-23 NOTE — Therapy (Signed)
[place], [place], [address] Phone: [phone]   Fax:  [phone]  Physical Therapy Treatment  Patient Details  Name: Alice Davis MRN: [id number] Date of Birth: August 14, 1970 Referring Provider (PT): Joaquin Courts Date: 03/23/2021   PT End of Session - 03/23/21 1702     Visit Number 25    Number of Visits 35    Date for PT Re-Evaluation 04/24/21    PT Start Time 10    PT Stop Time 0370    PT Time Calculation (min) 45 min             Past Medical History:  Diagnosis Date   Asthma    Cancer ([place])     Past Surgical History:  Procedure Laterality Date   BREAST RECONSTRUCTION WITH PLACEMENT OF TISSUE EXPANDER AND FLEX HD (ACELLULAR HYDRATED DERMIS) Bilateral 05/14/2019   Procedure: IMMEDIATE BREAST RECONSTRUCTION WITH PLACEMENT OF TISSUE EXPANDER AND FLEX HD (ACELLULAR HYDRATED DERMIS);  Surgeon: Wallace Going, DO;  Location: [facility];  Service: Plastics;  Laterality: Bilateral;   NASAL SINUS SURGERY     20 years ago   NIPPLE SPARING MASTECTOMY WITH SENTINEL LYMPH NODE BIOPSY Bilateral 05/14/2019   Procedure: BILATERAL NIPPLE SPARING MASTECTOMIES WITH LEFT SENTINEL LYMPH NODE BIOPSY;  Surgeon: [name], MD;  Location: [facility];  Service: General;  Laterality: Bilateral;  PEC   REMOVAL OF BILATERAL TISSUE EXPANDERS WITH PLACEMENT OF BILATERAL BREAST IMPLANTS Bilateral 07/13/2019   Procedure: REMOVAL OF BILATERAL TISSUE EXPANDERS WITH PLACEMENT OF BILATERAL BREAST IMPLANTS;  Surgeon: Wallace Going, DO;  Location: [facility];  Service: Plastics;  Laterality: Bilateral;   RHINOPLASTY     20 years   WISDOM TOOTH EXTRACTION      There were no vitals filed for this visit.   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 No/denies                [facility] PT Assessment - 03/23/21 0001       Assessment   Medical Diagnosis left breast cancer                     [facility] Adult PT Treatment/Exercise:  Therapeutic Exercise: Foam roll routine-ceiling punches, back stroke 1 x 10 Horizontal abduction 2 x 10 with GTB - laying on pink foam roller PNF D2 LUE 2 x 12 with GTB,  while laying on pink foam roller  Manual Therapy: Skilled palpation and monitoring of pt during TPDN IASTM along the axial and lateral pec Tack and stretch of the L axialla combined with L shoulder flexion/ abduction GHJ inferior mobs with shoulder abduction to end range grade IV  Neuromuscular re-ed: N/A  Therapeutic Activity: N/A  Modalities: N/A  Self Care: N/A  Consider / progression for next session:         Trigger Point Dry Needling - 03/23/21 0001  Consent Given? Yes    Education Handout Provided Previously provided    Other Dry Needling cording along the ribs    Pectoralis Major Response Twitch response elicited;Palpable increased muscle length                        PT Long Term Goals - 03/06/21 1639       PT LONG TERM GOAL #1   Title Pt will demonstrate 160 degrees of left shoulder flexion to allow pt to reach overhead    Status Achieved    Target Date 04/17/21      PT LONG TERM GOAL #2   Title Pt will demonstratse 160 degrees of left shoulder abduction to allow pt to reach out to the sides    Baseline 03/06/2021 140    Period Weeks    Target Date 04/17/21      PT LONG TERM  GOAL #3   Title Pt will report a 75% improvement in tightness and discomfort in left lateral trunk with end range shoulder ROM for improved comfort.    Baseline 03/06/2021 55%    Period Weeks    Status On-going    Target Date 04/17/21      PT LONG TERM GOAL #4   Title Pt will be independent in a home exercise program for continued strengthening and stretching    Period Weeks    Status On-going    Target Date 04/17/21      PT LONG TERM GOAL #5   Title Pt will report no pull in the left UE with AROM due to cording    Baseline 03/06/2021 continued cording in the L axilla    Time 6    Period Weeks    Status New    Target Date 04/17/21                   Plan - 03/23/21 1710     Clinical Impression Statement pt arrives to session reporting she did have some benefit since the last session but continues to note cording in the L axilla with soreness noted. continued TPDN focusing on the pec and cording. continued GHJ mobs working end range abduction with inferior mob. Continued working on posterior shoulder stretngthening and taught pt how to perform tack and stretch to help promote ROM.    PT Treatment/Interventions ADLs/Self Care Home Management;Manual techniques;Patient/family education;Therapeutic exercise;Passive range of motion;Manual lymph drainage;Vasopneumatic Device;Scar mobilization;Dry needling    PT Next Visit Plan pec stretches, L shoulder ROM, MFR to cording in L axilla - response to DN, anterior GHJ PA, scapular mobs. posterior chaing strengthening, lower trap/ suprapsinatus activation    Consulted and Agree with Plan of Care Patient             Patient will benefit from skilled therapeutic intervention in order to improve the following deficits and impairments:  Decreased range of motion, Postural dysfunction, Decreased scar mobility, Impaired UE functional use, Increased fascial restricitons, Decreased strength  Visit Diagnosis: Stiffness of left shoulder, not  elsewhere classified  Abnormal posture     Problem List Patient Active Problem List   Diagnosis Date Noted   S/P breast reconstruction, bilateral 07/21/2019   Acquired absence of breast 05/22/2019   Breast cancer ([place]) 05/14/2019   Malignant neoplasm of lower-inner quadrant of left breast in female, estrogen receptor positive ([place]) 03/24/2019   Allergic dermatitis 12/27/2017   Reactive airway disease without complication 26/83/4196   Allergic rhinitis 11/28/2016  Hyperlipidemia 11/28/2016   [name] PT, DPT, LAT, ATC  03/23/21  5:22 PM     [facility] [address] [place], [place], [address] Phone: [phone]   Fax:  [phone]  Name: Alice Davis MRN: [id number] Date of Birth: February 24, 1971

## 2021-03-25 IMAGING — US US BREAST BX W LOC DEV 1ST LESION IMG BX SPEC US GUIDE*L*
1 series · 11 of 11 positions shown · non-contrast
Comparison: Previous exam(s).
COMPARISON: Previous exam(s).

Addendum:
CLINICAL DATA: Patient presents for ultrasound-guided core biopsy
of mass in the LEFT breast. History of bilateral mastectomy for LEFT
breast cancer and subsequent bilateral silicone implant
reconstruction.

EXAM:
ULTRASOUND GUIDED LEFT BREAST CORE NEEDLE BIOPSY

[Series 1: us breast bx w loc dev 1st lesion img bx spec us g · 0.05mm/px · 11 of 11 slices shown]
[im 1/11]
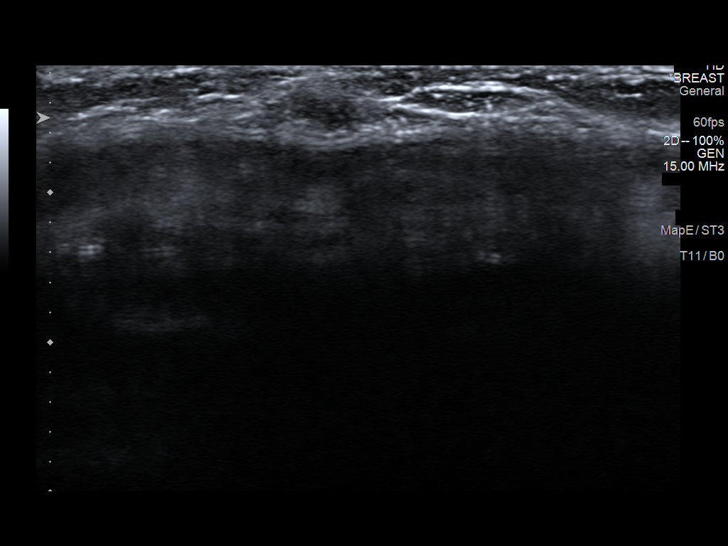
[im 2/11]
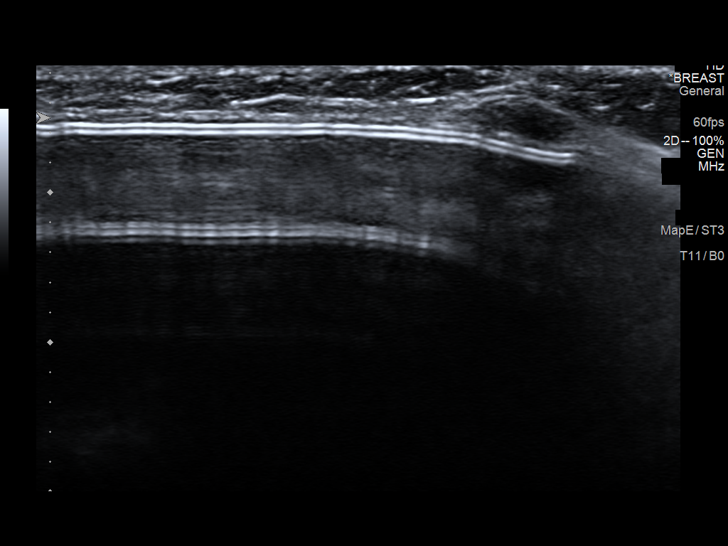
[im 3/11]
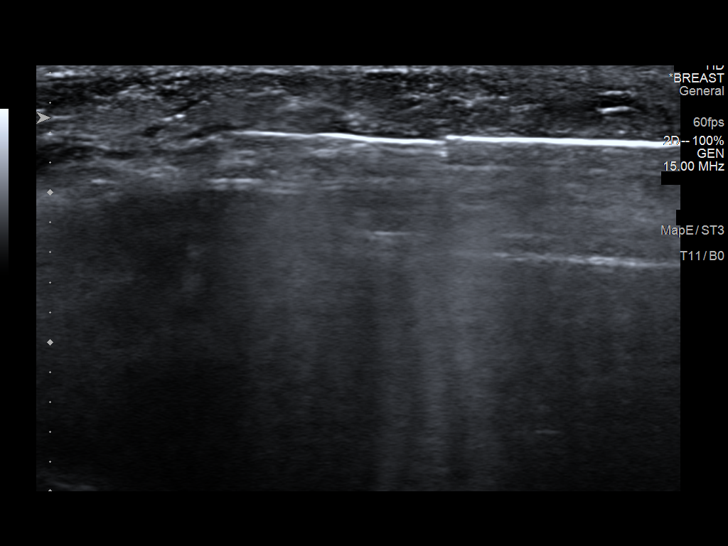
[im 4/11]
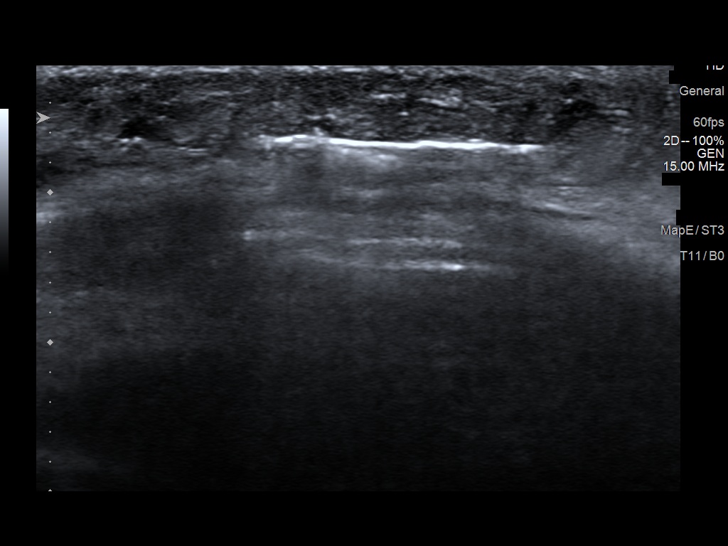
[im 5/11]
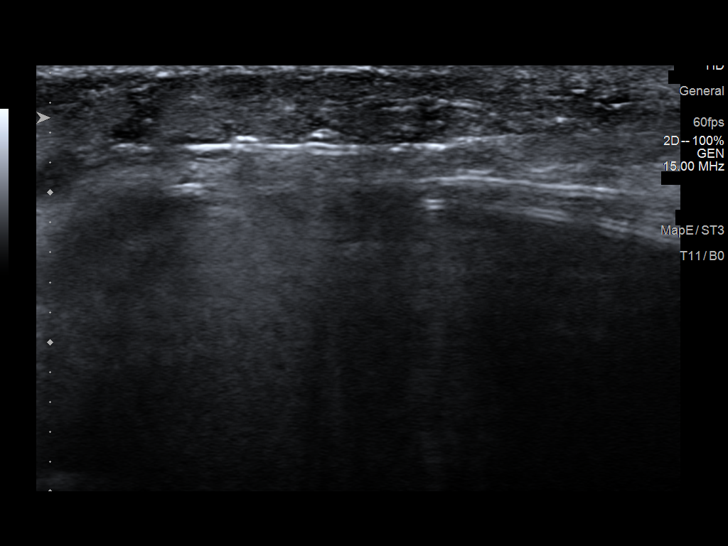
[im 6/11]
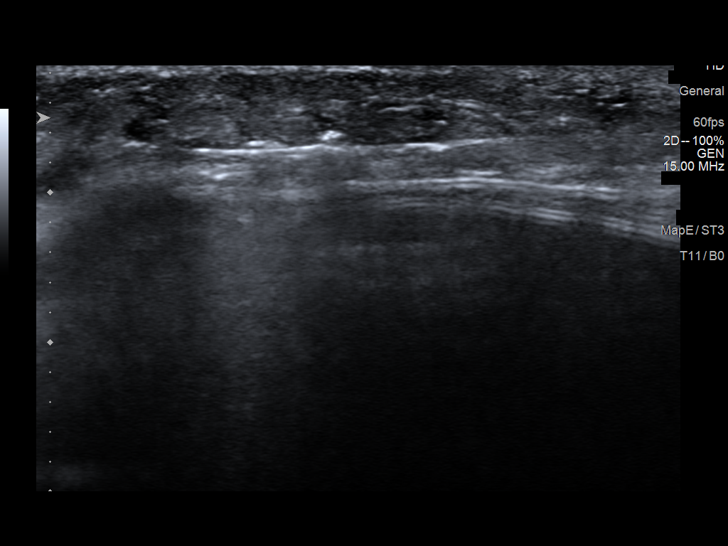
[im 7/11]
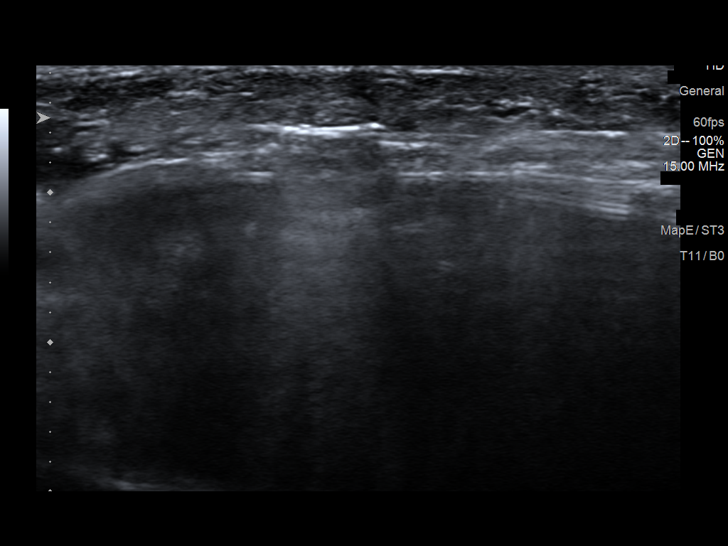
[im 8/11]
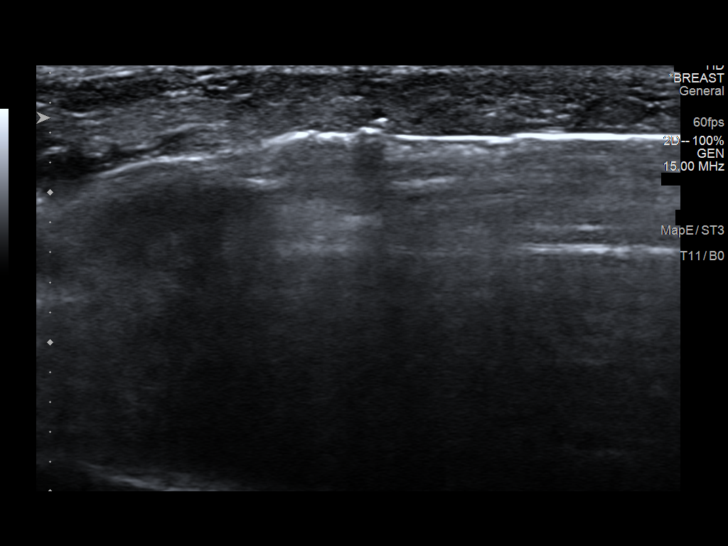
[im 9/11]
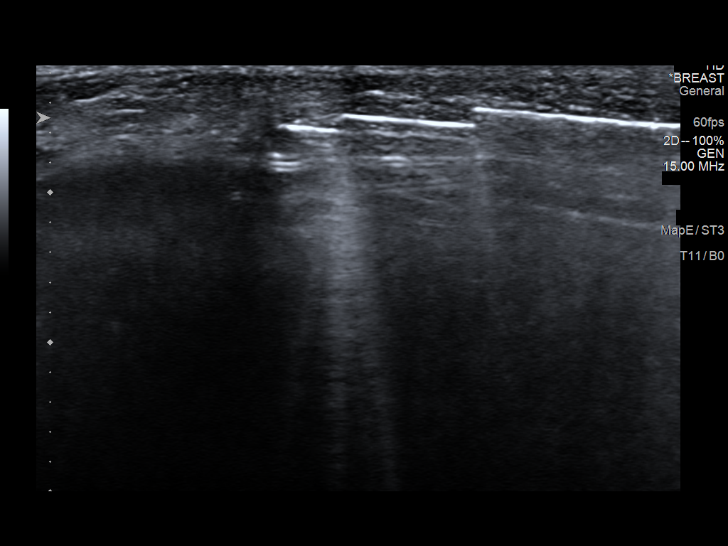
[im 10/11]
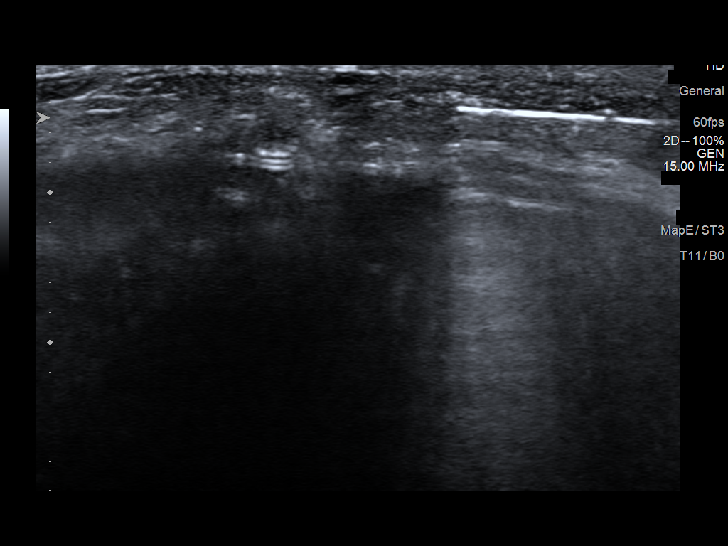
[im 11/11]
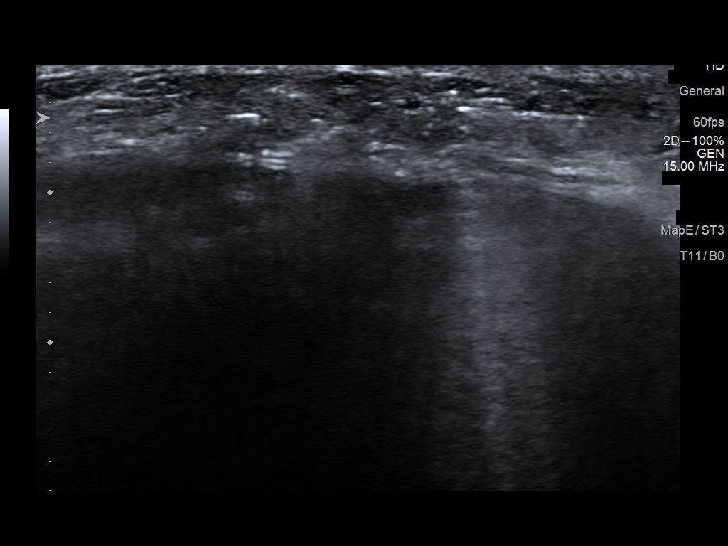

[11 of 11 positions shown; findings below may reference images not displayed]

PROCEDURE:
I met with the patient and we discussed the procedure of
ultrasound-guided biopsy, including benefits and alternatives. We
discussed the high likelihood of a successful procedure. We
discussed the risks of the procedure, including infection, bleeding,
tissue injury, clip migration, and inadequate sampling. We also
discussed the small risk of implant rupture. Informed written
consent was given. The usual time-out protocol was performed
immediately prior to the procedure.

Lesion quadrant: LOWER INNER QUADRANT reconstructed LEFT breast

Using sterile technique and 1% Lidocaine as local anesthetic, under
direct ultrasound visualization, a 14 gauge Matye device was
used to perform biopsy of mass in the 6:30 o'clock location of the
LEFT breast using a LATERAL to the approach. At the conclusion of
the procedure Q shaped tissue marker clip was deployed into the
biopsy cavity. Post clip mammogram was deferred due to visualization
of the clip with ultrasound.
IMPRESSION: Ultrasound guided biopsy of LEFT breast mass. No apparent
complications.

ADDENDUM:
Pathology revealed BENIGN BREAST PARENCHYMA WITH FOCAL SCAR-LIKE
HYALINE FIBROSIS, FOREIGN BODY GIANT CELL REACTION AND FAT NECROSIS,
CONSISTENT WITH HISTORY OF PREVIOUS PROCEDURE of the LEFT breast,
6:30 o'clock. This was found to be concordant by Dr. Rwina
Sire.

Pathology results were discussed with the patient by telephone. The
patient reported doing well after the biopsy with tenderness at the
site. Post biopsy instructions and care were reviewed and questions
were answered. The patient was encouraged to call The [REDACTED]

The patient was instructed to continue with monthly self breast
examinations and to have clinical follow-up as needed, the patient
has a history of bilateral mastectomies with reconstruction.

Kiyotsugu Toshifumi, RN, Breast Oncology Nurse Navigator, was notified of
biopsy results via [REDACTED] message on March 08, 2020.

Pathology results reported by Manuel Jorge Devittori, RN on 03/08/2020.

*** End of Addendum ***
PROCEDURE:
I met with the patient and we discussed the procedure of
ultrasound-guided biopsy, including benefits and alternatives. We
discussed the high likelihood of a successful procedure. We
discussed the risks of the procedure, including infection, bleeding,
tissue injury, clip migration, and inadequate sampling. We also
discussed the small risk of implant rupture. Informed written
consent was given. The usual time-out protocol was performed
immediately prior to the procedure.

Lesion quadrant: LOWER INNER QUADRANT reconstructed LEFT breast

Using sterile technique and 1% Lidocaine as local anesthetic, under
direct ultrasound visualization, a 14 gauge Matye device was
used to perform biopsy of mass in the 6:30 o'clock location of the
LEFT breast using a LATERAL to the approach. At the conclusion of
the procedure Q shaped tissue marker clip was deployed into the
biopsy cavity. Post clip mammogram was deferred due to visualization
of the clip with ultrasound.
IMPRESSION: Ultrasound guided biopsy of LEFT breast mass. No apparent
complications.

## 2021-03-27 ENCOUNTER — Ambulatory Visit: Payer: BC Managed Care – PPO | Admitting: Physical Therapy

## 2021-03-27 ENCOUNTER — Other Ambulatory Visit: Payer: Self-pay

## 2021-03-27 ENCOUNTER — Encounter: Payer: Self-pay | Admitting: Physical Therapy

## 2021-03-27 DIAGNOSIS — M25612 Stiffness of left shoulder, not elsewhere classified: Secondary | ICD-10-CM | POA: Diagnosis not present

## 2021-03-27 DIAGNOSIS — R293 Abnormal posture: Secondary | ICD-10-CM

## 2021-03-27 NOTE — Therapy (Signed)
[place], [place], [address] Phone: [phone]   Fax:  [phone]  Physical Therapy Treatment  Patient Details  Name: Alice Davis MRN: [id number] Date of Birth: 1970/06/25 Referring Provider (PT): Joaquin Courts Date: 03/27/2021   PT End of Session - 03/27/21 1632     Visit Number 26    Number of Visits 35    Date for PT Re-Evaluation 04/24/21    PT Start Time 1633    Activity Tolerance Patient tolerated treatment well    Behavior During Therapy [facility] for tasks assessed/performed             Past Medical History:  Diagnosis Date   Asthma    Cancer [facility])     Past Surgical History:  Procedure Laterality Date   BREAST RECONSTRUCTION WITH PLACEMENT OF TISSUE EXPANDER AND FLEX HD (ACELLULAR HYDRATED DERMIS) Bilateral 05/14/2019   Procedure: IMMEDIATE BREAST RECONSTRUCTION WITH PLACEMENT OF TISSUE EXPANDER AND FLEX HD (ACELLULAR HYDRATED DERMIS);  Surgeon: Wallace Going, DO;  Location: [place];  Service: Plastics;  Laterality: Bilateral;   NASAL SINUS SURGERY     20 years ago   NIPPLE SPARING MASTECTOMY WITH SENTINEL LYMPH NODE BIOPSY Bilateral 05/14/2019   Procedure: BILATERAL NIPPLE SPARING MASTECTOMIES WITH LEFT SENTINEL LYMPH NODE BIOPSY;  Surgeon: [name], MD;  Location: [place];  Service: General;  Laterality: Bilateral;  PEC   REMOVAL OF BILATERAL TISSUE EXPANDERS WITH PLACEMENT OF BILATERAL BREAST IMPLANTS Bilateral 07/13/2019   Procedure: REMOVAL OF BILATERAL TISSUE EXPANDERS WITH PLACEMENT OF BILATERAL BREAST IMPLANTS;  Surgeon: Wallace Going, DO;  Location: [facility];  Service: Plastics;  Laterality: Bilateral;   RHINOPLASTY     20 years   WISDOM TOOTH EXTRACTION      There were no vitals filed for this visit.   Subjective Assessment - 03/27/21 1634     Subjective "I am doing okay today, the shoulder is still alittle tight but not bad."    Patient  Stated Goals to get better ROM and decrease tightness    Currently in Pain? No/denies    Pain Score 0-No pain    Pain Orientation Left                           [facility] Adult PT Treatment/Exercise:  Therapeutic Exercise: I's, T's and Y's 1 x 10 ea with 1#, repeated with 2# L shoulder ER in R Sidelying 2 x 10 with 2# Prone snow angel with LUE 2 x Supine L shoulder maintained ceiling punch with CW/CCW 2 x 12 ea 1# L shoulder ER in R sidelying 2 x 12 2# Bicep curl in supine focus on eccentric loading 1 x 10 with GTB  Manual Therapy:  GHJ  AP, anterior mobs, grade  MTPR along the pec major/ minor Myofascil stretch along the lateral aspec of the pec and cording in the axilla.   Neuromuscular re-ed: N/A  Therapeutic Activity: N./A  Modalities: N/A  Self Care: N/A  Consider / progression for next session:                   PT Long Term Goals - 03/06/21 1639       PT LONG TERM GOAL #1   Title Pt will demonstrate 160 degrees of left shoulder flexion to allow pt to reach overhead    Status Achieved    Target Date 04/17/21  PT LONG TERM GOAL #2   Title Pt will demonstratse 160 degrees of left shoulder abduction to allow pt to reach out to the sides    Baseline 03/06/2021 140    Period Weeks    Target Date 04/17/21      PT LONG TERM GOAL #3   Title Pt will report a 75% improvement in tightness and discomfort in left lateral trunk with end range shoulder ROM for improved comfort.    Baseline 03/06/2021 55%    Period Weeks    Status On-going    Target Date 04/17/21      PT LONG TERM GOAL #4   Title Pt will be independent in a home exercise program for continued strengthening and stretching    Period Weeks    Status On-going    Target Date 04/17/21      PT LONG TERM GOAL #5   Title Pt will report no pull in the left UE with AROM due to cording    Baseline 03/06/2021 continued cording in the L axilla    Time 6    Period Weeks     Status New    Target Date 04/17/21                   Plan - 03/27/21 1702     Clinical Impression Statement pt reports she is feeling some stiffness noted mostly in the axilla/ pec. she continues to have good ROM but reports stiffness at end range. focused mostly on STW along the pec major/ minor and GHJ mobs. Continued working on shoulder strengthening, and scapular stability. She continues to note some stiffness and cording located at the axilla.    PT Treatment/Interventions ADLs/Self Care Home Management;Manual techniques;Patient/family education;Therapeutic exercise;Passive range of motion;Manual lymph drainage;Vasopneumatic Device;Scar mobilization;Dry needling    PT Next Visit Plan pec stretches, L shoulder ROM, MFR to cording in L axilla - response to DN, anterior GHJ PA, scapular mobs. posterior chaing strengthening, lower trap/ suprapsinatus activation    PT Home Exercise Plan [id number]             Patient will benefit from skilled therapeutic intervention in order to improve the following deficits and impairments:  Decreased range of motion, Postural dysfunction, Decreased scar mobility, Impaired UE functional use, Increased fascial restricitons, Decreased strength  Visit Diagnosis: Stiffness of left shoulder, not elsewhere classified  Abnormal posture     Problem List Patient Active Problem List   Diagnosis Date Noted   S/P breast reconstruction, bilateral 07/21/2019   Acquired absence of breast 05/22/2019   Breast cancer ([place]) 05/14/2019   Malignant neoplasm of lower-inner quadrant of left breast in female, estrogen receptor positive ([place]) 03/24/2019   Allergic dermatitis 12/27/2017   Reactive airway disease without complication 90/30/0923   Allergic rhinitis 11/28/2016   Hyperlipidemia 11/28/2016    [name] PT, DPT, LAT, ATC  03/27/21  5:21 PM     [facility] [address] [place], [place], [address] Phone: [phone]   Fax:  [phone]  Name: Alice Davis MRN: [id number] Date of Birth: [birth year]

## 2021-03-28 ENCOUNTER — Ambulatory Visit: Payer: BC Managed Care – PPO | Admitting: Rehabilitation

## 2021-03-28 DIAGNOSIS — Z170 Estrogen receptor positive status [ER+]: Secondary | ICD-10-CM

## 2021-03-28 DIAGNOSIS — R293 Abnormal posture: Secondary | ICD-10-CM

## 2021-03-28 DIAGNOSIS — M25612 Stiffness of left shoulder, not elsewhere classified: Secondary | ICD-10-CM | POA: Diagnosis present

## 2021-03-28 DIAGNOSIS — Z483 Aftercare following surgery for neoplasm: Secondary | ICD-10-CM | POA: Diagnosis present

## 2021-03-28 DIAGNOSIS — C50312 Malignant neoplasm of lower-inner quadrant of left female breast: Secondary | ICD-10-CM | POA: Diagnosis present

## 2021-03-28 DIAGNOSIS — L599 Disorder of the skin and subcutaneous tissue related to radiation, unspecified: Secondary | ICD-10-CM

## 2021-03-28 NOTE — Therapy (Signed)
[place] @ [place], [place], [address] Phone: [phone]   Fax:  [phone]  Physical Therapy Treatment  Patient Details  Name: Alice Davis MRN: [id number] Date of Birth: [birth year] Referring Provider (PT): Joaquin Courts Date: 03/28/2021   PT End of Session - 03/28/21 1709     Visit Number 27    Number of Visits 35    Date for PT Re-Evaluation 04/24/21    PT Start Time 1601    PT Stop Time 1651    PT Time Calculation (min) 50 min    Activity Tolerance Patient tolerated treatment well    Behavior During Therapy [facility] for tasks assessed/performed             Past Medical History:  Diagnosis Date   Asthma    Cancer ([place])     Past Surgical History:  Procedure Laterality Date   BREAST RECONSTRUCTION WITH PLACEMENT OF TISSUE EXPANDER AND FLEX HD (ACELLULAR HYDRATED DERMIS) Bilateral 05/14/2019   Procedure: IMMEDIATE BREAST RECONSTRUCTION WITH PLACEMENT OF TISSUE EXPANDER AND FLEX HD (ACELLULAR HYDRATED DERMIS);  Surgeon: Wallace Going, DO;  Location: [place];  Service: Plastics;  Laterality: Bilateral;   NASAL SINUS SURGERY     20 years ago   NIPPLE SPARING MASTECTOMY WITH SENTINEL LYMPH NODE BIOPSY Bilateral 05/14/2019   Procedure: BILATERAL NIPPLE SPARING MASTECTOMIES WITH LEFT SENTINEL LYMPH NODE BIOPSY;  Surgeon: [name], MD;  Location: [place];  Service: General;  Laterality: Bilateral;  PEC   REMOVAL OF BILATERAL TISSUE EXPANDERS WITH PLACEMENT OF BILATERAL BREAST IMPLANTS Bilateral 07/13/2019   Procedure: REMOVAL OF BILATERAL TISSUE EXPANDERS WITH PLACEMENT OF BILATERAL BREAST IMPLANTS;  Surgeon: Wallace Going, DO;  Location: [place];  Service: Plastics;  Laterality: Bilateral;   RHINOPLASTY     20 years   WISDOM TOOTH EXTRACTION      There were no vitals filed for this visit.   Subjective Assessment - 03/28/21 1708     Subjective Nothing new    Pertinent  History January 7th Bil Masectomy with 3 lymph node removal,Left mastectomy: Multifocal invasive lobular cancer largest focus 2.5 cm, grade 2, margins negative, 1/3 lymph node positive ER 80-95 %, PR 80-95 %, HER-2 negative, Ki-67 2-20%right mastectomy: Benign; then bil implant exchange July 13, 2019, pt completed radiation 09/25/19, pt currently tamoxifen    Patient Stated Goals to get better ROM and decrease tightness    Currently in Pain? No/denies                               [facility] Adult PT Treatment/Exercise - 03/28/21 0001       Manual Therapy   Soft tissue mobilization STM to pecs, lats, serratus    Myofascial Release to cording in L axilla /upper arm- several very thick cords palpable from axilla down towards antecubital fossa    Passive ROM PROM left shoulder during MFR                          PT Long Term Goals - 03/06/21 1639       PT LONG TERM GOAL #1   Title Pt will demonstrate 160 degrees of left shoulder flexion to allow pt to reach overhead    Status Achieved    Target Date 04/17/21      PT LONG TERM GOAL #2  Title Pt will demonstratse 160 degrees of left shoulder abduction to allow pt to reach out to the sides    Baseline 03/06/2021 140    Period Weeks    Target Date 04/17/21      PT LONG TERM GOAL #3   Title Pt will report a 75% improvement in tightness and discomfort in left lateral trunk with end range shoulder ROM for improved comfort.    Baseline 03/06/2021 55%    Period Weeks    Status On-going    Target Date 04/17/21      PT LONG TERM GOAL #4   Title Pt will be independent in a home exercise program for continued strengthening and stretching    Period Weeks    Status On-going    Target Date 04/17/21      PT LONG TERM GOAL #5   Title Pt will report no pull in the left UE with AROM due to cording    Baseline 03/06/2021 continued cording in the L axilla    Time 6    Period Weeks    Status New    Target Date  04/17/21                   Plan - 03/28/21 1709     Clinical Impression Statement Pt continues with chronic stubborn cording that loosens with MT and then returns overnight with more stiffness the next day.    PT Treatment/Interventions ADLs/Self Care Home Management;Manual techniques;Patient/family education;Therapeutic exercise;Passive range of motion;Manual lymph drainage;Vasopneumatic Device;Scar mobilization;Dry needling             Patient will benefit from skilled therapeutic intervention in order to improve the following deficits and impairments:     Visit Diagnosis: Stiffness of left shoulder, not elsewhere classified  Aftercare following surgery for neoplasm  Abnormal posture  Disorder of the skin and subcutaneous tissue related to radiation, unspecified  Malignant neoplasm of lower-inner quadrant of left breast in female, estrogen receptor positive [facility])     Problem List Patient Active Problem List   Diagnosis Date Noted   S/P breast reconstruction, bilateral 07/21/2019   Acquired absence of breast 05/22/2019   Breast cancer ([place]) 05/14/2019   Malignant neoplasm of lower-inner quadrant of left breast in female, estrogen receptor positive ([place]) 03/24/2019   Allergic dermatitis 12/27/2017   Reactive airway disease without complication 73/22/0254   Allergic rhinitis 11/28/2016   Hyperlipidemia 11/28/2016    [name], PT 03/28/2021, 5:12 PM  [place] @ [place], [place], [address] Phone: [phone]   Fax:  [phone]  Name: Alice Davis MRN: [id number] Date of Birth: 1970-07-24

## 2021-04-03 ENCOUNTER — Ambulatory Visit: Payer: BC Managed Care – PPO | Admitting: Physical Therapy

## 2021-04-03 ENCOUNTER — Other Ambulatory Visit: Payer: Self-pay

## 2021-04-03 ENCOUNTER — Encounter: Payer: Self-pay | Admitting: Physical Therapy

## 2021-04-03 DIAGNOSIS — M25612 Stiffness of left shoulder, not elsewhere classified: Secondary | ICD-10-CM | POA: Diagnosis not present

## 2021-04-03 DIAGNOSIS — R293 Abnormal posture: Secondary | ICD-10-CM

## 2021-04-03 NOTE — Therapy (Signed)
[place], [place], [address] Phone: [phone]   Fax:  [phone]  Physical Therapy Treatment  Patient Details  Name: Alice Davis MRN: [id number] Date of Birth: July 02, 1970 Referring Provider (PT): Joaquin Courts Date: 04/03/2021   PT End of Session - 04/03/21 1504     Visit Number 28    Number of Visits 35    Date for PT Re-Evaluation 04/24/21    PT Start Time 1504    PT Stop Time 1547    PT Time Calculation (min) 43 min    Activity Tolerance Patient tolerated treatment well    Behavior During Therapy [facility] for tasks assessed/performed             Past Medical History:  Diagnosis Date   Asthma    Cancer ([place])     Past Surgical History:  Procedure Laterality Date   BREAST RECONSTRUCTION WITH PLACEMENT OF TISSUE EXPANDER AND FLEX HD (ACELLULAR HYDRATED DERMIS) Bilateral 05/14/2019   Procedure: IMMEDIATE BREAST RECONSTRUCTION WITH PLACEMENT OF TISSUE EXPANDER AND FLEX HD (ACELLULAR HYDRATED DERMIS);  Surgeon: Wallace Going, DO;  Location: [place];  Service: Plastics;  Laterality: Bilateral;   NASAL SINUS SURGERY     20 years ago   NIPPLE SPARING MASTECTOMY WITH SENTINEL LYMPH NODE BIOPSY Bilateral 05/14/2019   Procedure: BILATERAL NIPPLE SPARING MASTECTOMIES WITH LEFT SENTINEL LYMPH NODE BIOPSY;  Surgeon: [name], MD;  Location: [place];  Service: General;  Laterality: Bilateral;  PEC   REMOVAL OF BILATERAL TISSUE EXPANDERS WITH PLACEMENT OF BILATERAL BREAST IMPLANTS Bilateral 07/13/2019   Procedure: REMOVAL OF BILATERAL TISSUE EXPANDERS WITH PLACEMENT OF BILATERAL BREAST IMPLANTS;  Surgeon: Wallace Going, DO;  Location: [place];  Service: Plastics;  Laterality: Bilateral;   RHINOPLASTY     20 years   WISDOM TOOTH EXTRACTION      There were no vitals filed for this visit.   Subjective Assessment - 04/03/21 1504     Subjective "the should is okay, I am  getting to the point where I am thinig is this the best it will get."    Patient Stated Goals to get better ROM and decrease tightness    Currently in Pain? No/denies    Pain Orientation Left                [facility] PT Assessment - 04/03/21 0001       Assessment   Medical Diagnosis left breast cancer                    [facility] Adult PT Treatment/Exercise:  Therapeutic Exercise: Pec stretching 3 x 30  Manual Therapy:  Skilled palpation and monitoring of pt during TPDN IASTM Along L axilla/ bicep Myofascial stretching along L flank and Axilla GHJ mobs grade IV inferior/ anterior  Neuromuscular re-ed: N/A  Therapeutic Activity: N/A  Modalities: N/A  Self Care: N/A  Consider / progression for next session:          Trigger Point Dry Needling - 04/03/21 0001     Consent Given? Yes    Other Dry Needling cording along the ribs/ medial border of axiall into the bicep                        PT Long Term Goals - 03/06/21 1639       PT LONG TERM GOAL #1   Title Pt will demonstrate  160 degrees of left shoulder flexion to allow pt to reach overhead    Status Achieved    Target Date 04/17/21      PT LONG TERM GOAL #2   Title Pt will demonstratse 160 degrees of left shoulder abduction to allow pt to reach out to the sides    Baseline 03/06/2021 140    Period Weeks    Target Date 04/17/21      PT LONG TERM GOAL #3   Title Pt will report a 75% improvement in tightness and discomfort in left lateral trunk with end range shoulder ROM for improved comfort.    Baseline 03/06/2021 55%    Period Weeks    Status On-going    Target Date 04/17/21      PT LONG TERM GOAL #4   Title Pt will be independent in a home exercise program for continued strengthening and stretching    Period Weeks    Status On-going    Target Date 04/17/21      PT LONG TERM GOAL #5   Title Pt will report no pull in the left UE with AROM due to cording    Baseline  03/06/2021 continued cording in the L axilla    Time 6    Period Weeks    Status New    Target Date 04/17/21                   Plan - 04/03/21 1559     Clinical Impression Statement Pt arrives noting continued cording in the axialla into the shoulder. Focused session manual techqineus with TPDN focusing on along the cording followed with DTM and myofasical stretching which she noted was uncomfortable during treatment but was helpful following.    PT Treatment/Interventions ADLs/Self Care Home Management;Manual techniques;Patient/family education;Therapeutic exercise;Passive range of motion;Manual lymph drainage;Vasopneumatic Device;Scar mobilization;Dry needling    PT Next Visit Plan pec stretches, L shoulder ROM, MFR to cording in L axilla - response to DN, anterior GHJ PA, scapular mobs. posterior chaing strengthening, lower trap/ suprapsinatus activation             Patient will benefit from skilled therapeutic intervention in order to improve the following deficits and impairments:  Decreased range of motion, Postural dysfunction, Decreased scar mobility, Impaired UE functional use, Increased fascial restricitons, Decreased strength  Visit Diagnosis: Stiffness of left shoulder, not elsewhere classified  Abnormal posture     Problem List Patient Active Problem List   Diagnosis Date Noted   S/P breast reconstruction, bilateral 07/21/2019   Acquired absence of breast 05/22/2019   Breast cancer ([place]) 05/14/2019   Malignant neoplasm of lower-inner quadrant of left breast in female, estrogen receptor positive ([place]) 03/24/2019   Allergic dermatitis 12/27/2017   Reactive airway disease without complication 35/32/9924   Allergic rhinitis 11/28/2016   Hyperlipidemia 11/28/2016   [name] PT, DPT, LAT, ATC  04/03/21  4:03 PM      [facility] [address] [place], [place], [address] Phone: [phone]    Fax:  [phone]  Name: Alice Davis MRN: [id number] Date of Birth: Jul 04, 1970

## 2021-04-06 ENCOUNTER — Encounter: Payer: BC Managed Care – PPO | Admitting: Physical Therapy

## 2021-04-11 ENCOUNTER — Other Ambulatory Visit: Payer: Self-pay

## 2021-04-11 ENCOUNTER — Ambulatory Visit: Payer: BC Managed Care – PPO | Attending: Radiation Oncology | Admitting: Rehabilitation

## 2021-04-11 ENCOUNTER — Encounter: Payer: Self-pay | Admitting: Rehabilitation

## 2021-04-11 DIAGNOSIS — Z170 Estrogen receptor positive status [ER+]: Secondary | ICD-10-CM | POA: Insufficient documentation

## 2021-04-11 DIAGNOSIS — M25612 Stiffness of left shoulder, not elsewhere classified: Secondary | ICD-10-CM | POA: Diagnosis not present

## 2021-04-11 DIAGNOSIS — Z9013 Acquired absence of bilateral breasts and nipples: Secondary | ICD-10-CM

## 2021-04-11 DIAGNOSIS — C50312 Malignant neoplasm of lower-inner quadrant of left female breast: Secondary | ICD-10-CM | POA: Diagnosis present

## 2021-04-11 DIAGNOSIS — M25611 Stiffness of right shoulder, not elsewhere classified: Secondary | ICD-10-CM

## 2021-04-11 DIAGNOSIS — R293 Abnormal posture: Secondary | ICD-10-CM | POA: Diagnosis present

## 2021-04-11 DIAGNOSIS — L599 Disorder of the skin and subcutaneous tissue related to radiation, unspecified: Secondary | ICD-10-CM

## 2021-04-11 DIAGNOSIS — Z483 Aftercare following surgery for neoplasm: Secondary | ICD-10-CM | POA: Diagnosis present

## 2021-04-11 NOTE — Therapy (Signed)
[place] @ [place], [place], [address] Phone: [phone]   Fax:  [phone]  Physical Therapy Treatment  Patient Details  Name: Alice Davis MRN: [id number] Date of Birth: 1971/02/26 Referring Provider (PT): Joaquin Courts Date: 04/11/2021   PT End of Session - 04/11/21 1655     Visit Number 29    Number of Visits 35    Date for PT Re-Evaluation 04/24/21    PT Start Time 1600    PT Stop Time 1654    PT Time Calculation (min) 54 min    Activity Tolerance Patient tolerated treatment well    Behavior During Therapy [facility] for tasks assessed/performed             Past Medical History:  Diagnosis Date   Asthma    Cancer ([place])     Past Surgical History:  Procedure Laterality Date   BREAST RECONSTRUCTION WITH PLACEMENT OF TISSUE EXPANDER AND FLEX HD (ACELLULAR HYDRATED DERMIS) Bilateral 05/14/2019   Procedure: IMMEDIATE BREAST RECONSTRUCTION WITH PLACEMENT OF TISSUE EXPANDER AND FLEX HD (ACELLULAR HYDRATED DERMIS);  Surgeon: Wallace Going, DO;  Location: [place];  Service: Plastics;  Laterality: Bilateral;   NASAL SINUS SURGERY     20 years ago   NIPPLE SPARING MASTECTOMY WITH SENTINEL LYMPH NODE BIOPSY Bilateral 05/14/2019   Procedure: BILATERAL NIPPLE SPARING MASTECTOMIES WITH LEFT SENTINEL LYMPH NODE BIOPSY;  Surgeon: [name], MD;  Location: [place];  Service: General;  Laterality: Bilateral;  PEC   REMOVAL OF BILATERAL TISSUE EXPANDERS WITH PLACEMENT OF BILATERAL BREAST IMPLANTS Bilateral 07/13/2019   Procedure: REMOVAL OF BILATERAL TISSUE EXPANDERS WITH PLACEMENT OF BILATERAL BREAST IMPLANTS;  Surgeon: Wallace Going, DO;  Location: [place];  Service: Plastics;  Laterality: Bilateral;   RHINOPLASTY     20 years   WISDOM TOOTH EXTRACTION      There were no vitals filed for this visit.   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 No/denies                               [facility] Adult PT Treatment/Exercise - 04/11/21 0001       Manual Therapy   Soft tissue mobilization STM to pecs, lats, serratus (P)     Myofascial Release to cording in L axilla /upper arm- several very thick cords palpable from axilla down towards antecubital fossa (P)     Passive ROM PROM left shoulder during MFR (P)                           PT Long Term Goals - 03/06/21 1639       PT LONG TERM GOAL #1   Title Pt will demonstrate 160 degrees of left shoulder flexion to allow pt to reach overhead    Status Achieved    Target Date 04/17/21  PT LONG TERM GOAL #2   Title Pt will demonstratse 160 degrees of left shoulder abduction to allow pt to reach out to the sides    Baseline 03/06/2021 140    Period Weeks    Target Date 04/17/21      PT LONG TERM GOAL #3   Title Pt will report a 75% improvement in tightness and discomfort in left lateral trunk with end range shoulder ROM for improved comfort.    Baseline 03/06/2021 55%    Period Weeks    Status On-going    Target Date 04/17/21      PT LONG TERM GOAL #4   Title Pt will be independent in a home exercise program for continued strengthening and stretching    Period Weeks    Status On-going    Target Date 04/17/21      PT LONG TERM GOAL #5   Title Pt will report no pull in the left UE with AROM due to cording    Baseline 03/06/2021 continued cording in the L axilla    Time 6    Period Weeks    Status New     Target Date 04/17/21                   Plan - 04/11/21 1655     Clinical Impression Statement Pt doing well with chronic cording and radiation fibrosis resulting stiffness of the Lt upper quadrant.  Pt has made improvements but we have difficulty accessing lateral trunk cord moving under the implant.    PT Frequency 2x / week    PT Treatment/Interventions ADLs/Self Care Home Management;Manual techniques;Patient/family education;Therapeutic exercise;Passive range of motion;Manual lymph drainage;Vasopneumatic Device;Scar mobilization;Dry needling    Consulted and Agree with Plan of Care Patient             Patient will benefit from skilled therapeutic intervention in order to improve the following deficits and impairments:  Decreased range of motion, Postural dysfunction, Decreased scar mobility, Impaired UE functional use, Increased fascial restricitons, Decreased strength  Visit Diagnosis: Stiffness of left shoulder, not elsewhere classified  Abnormal posture  Aftercare following surgery for neoplasm  Disorder of the skin and subcutaneous tissue related to radiation, unspecified  Malignant neoplasm of lower-inner quadrant of left breast in female, estrogen receptor positive (HCC)  Stiffness of right shoulder, not elsewhere classified  Acquired absence of both breasts     Problem List Patient Active Problem List   Diagnosis Date Noted   S/P breast reconstruction, bilateral 07/21/2019   Acquired absence of breast 05/22/2019   Breast cancer ([place]) 05/14/2019   Malignant neoplasm of lower-inner quadrant of left breast in female, estrogen receptor positive ([place]) 03/24/2019   Allergic dermatitis 12/27/2017   Reactive airway disease without complication 83/38/2505   Allergic rhinitis 11/28/2016   Hyperlipidemia 11/28/2016    [name], PT 04/11/2021, 4:59 PM  [place] @ [place],  [place], [address] Phone: [phone]   Fax:  [phone]  Name: Alice Davis MRN: [id number] Date of Birth: 1970/09/26

## 2021-04-18 ENCOUNTER — Ambulatory Visit: Payer: BC Managed Care – PPO | Admitting: Rehabilitation

## 2021-04-18 ENCOUNTER — Encounter: Payer: Self-pay | Admitting: Rehabilitation

## 2021-04-18 ENCOUNTER — Other Ambulatory Visit: Payer: Self-pay

## 2021-04-18 DIAGNOSIS — M25612 Stiffness of left shoulder, not elsewhere classified: Secondary | ICD-10-CM | POA: Diagnosis not present

## 2021-04-18 DIAGNOSIS — R293 Abnormal posture: Secondary | ICD-10-CM

## 2021-04-18 DIAGNOSIS — Z483 Aftercare following surgery for neoplasm: Secondary | ICD-10-CM

## 2021-04-18 DIAGNOSIS — Z9013 Acquired absence of bilateral breasts and nipples: Secondary | ICD-10-CM

## 2021-04-18 DIAGNOSIS — C50312 Malignant neoplasm of lower-inner quadrant of left female breast: Secondary | ICD-10-CM

## 2021-04-18 DIAGNOSIS — Z170 Estrogen receptor positive status [ER+]: Secondary | ICD-10-CM

## 2021-04-18 DIAGNOSIS — L599 Disorder of the skin and subcutaneous tissue related to radiation, unspecified: Secondary | ICD-10-CM

## 2021-04-18 DIAGNOSIS — M25611 Stiffness of right shoulder, not elsewhere classified: Secondary | ICD-10-CM

## 2021-04-18 NOTE — Therapy (Signed)
[place] @ [place], [place], [address] Phone: [phone]   Fax:  [phone]  Physical Therapy Treatment  Patient Details  Name: Alice Davis MRN: [id number] Date of Birth: 23-Oct-1970 Referring Provider (PT): Joaquin Courts Date: 04/18/2021   PT End of Session - 04/18/21 1707     Visit Number 30    Number of Visits 35    Date for PT Re-Evaluation 04/24/21    Authorization - Number of Visits 58    PT Start Time 1610    PT Stop Time 1700    PT Time Calculation (min) 50 min    Activity Tolerance Patient tolerated treatment well    Behavior During Therapy [facility] for tasks assessed/performed             Past Medical History:  Diagnosis Date   Asthma    Cancer ([place])     Past Surgical History:  Procedure Laterality Date   BREAST RECONSTRUCTION WITH PLACEMENT OF TISSUE EXPANDER AND FLEX HD (ACELLULAR HYDRATED DERMIS) Bilateral 05/14/2019   Procedure: IMMEDIATE BREAST RECONSTRUCTION WITH PLACEMENT OF TISSUE EXPANDER AND FLEX HD (ACELLULAR HYDRATED DERMIS);  Surgeon: Wallace Going, DO;  Location: [place];  Service: Plastics;  Laterality: Bilateral;   NASAL SINUS SURGERY     20 years ago   NIPPLE SPARING MASTECTOMY WITH SENTINEL LYMPH NODE BIOPSY Bilateral 05/14/2019   Procedure: BILATERAL NIPPLE SPARING MASTECTOMIES WITH LEFT SENTINEL LYMPH NODE BIOPSY;  Surgeon: [name], MD;  Location: [place];  Service: General;  Laterality: Bilateral;  PEC   REMOVAL OF BILATERAL TISSUE EXPANDERS WITH PLACEMENT OF BILATERAL BREAST IMPLANTS Bilateral 07/13/2019   Procedure: REMOVAL OF BILATERAL TISSUE EXPANDERS WITH PLACEMENT OF BILATERAL BREAST IMPLANTS;  Surgeon: Wallace Going, DO;  Location: [place];  Service: Plastics;  Laterality: Bilateral;   RHINOPLASTY     20 years   WISDOM TOOTH EXTRACTION      There were no vitals filed for this visit.   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 No/denies                [facility] PT Assessment - 04/18/21 0001       AROM   Left Shoulder Flexion 160 Degrees    Left Shoulder ABduction 150 Degrees                           [facility] Adult PT Treatment/Exercise - 04/18/21 0001       Manual Therapy   Soft tissue mobilization STM to pecs, lats, serratus    Myofascial Release to cording in L axilla /upper arm- several very thick cords palpable from axilla down towards antecubital fossa    Passive ROM PROM left shoulder during MFR                          PT Long Term Goals - 03/06/21 1639       PT LONG TERM GOAL #1   Title Pt  will demonstrate 160 degrees of left shoulder flexion to allow pt to reach overhead    Status Achieved    Target Date 04/17/21      PT LONG TERM GOAL #2   Title Pt will demonstratse 160 degrees of left shoulder abduction to allow pt to reach out to the sides    Baseline 03/06/2021 140    Period Weeks    Target Date 04/17/21      PT LONG TERM GOAL #3   Title Pt will report a 75% improvement in tightness and discomfort in left lateral trunk with end range shoulder ROM for improved comfort.    Baseline 03/06/2021 55%    Period Weeks    Status On-going    Target Date 04/17/21      PT LONG TERM GOAL #4   Title Pt will be independent in a home exercise program for continued strengthening and stretching    Period Weeks    Status On-going    Target Date 04/17/21      PT LONG TERM GOAL #5   Title Pt will report no pull in the left UE with AROM due to cording     Baseline 03/06/2021 continued cording in the L axilla    Time 6    Period Weeks    Status New    Target Date 04/17/21                   Plan - 04/18/21 1707     Clinical Impression Statement Pt continues with chronic cording and radiation fibrosis with stiffness of the Lt upper quadrant and 1 main cord in the upper arm along the biceps and into axilla.  Pt has made AROM improvements and we compared her ROM to last year at this time and is is improved.  Pt may complete her remaining visits or be done after her last DN session.    PT Frequency 2x / week    PT Treatment/Interventions ADLs/Self Care Home Management;Manual techniques;Patient/family education;Therapeutic exercise;Passive range of motion;Manual lymph drainage;Vasopneumatic Device;Scar mobilization;Dry needling    PT Next Visit Plan pec stretches, L shoulder ROM, MFR to cording in L axilla - response to DN, anterior GHJ PA, scapular mobs. posterior chaing strengthening, lower trap/ suprapsinatus activation    Consulted and Agree with Plan of Care Patient             Patient will benefit from skilled therapeutic intervention in order to improve the following deficits and impairments:     Visit Diagnosis: Stiffness of left shoulder, not elsewhere classified  Abnormal posture  Aftercare following surgery for neoplasm  Disorder of the skin and subcutaneous tissue related to radiation, unspecified  Malignant neoplasm of lower-inner quadrant of left breast in female, estrogen receptor positive (HCC)  Stiffness of right shoulder, not elsewhere classified  Acquired absence of both breasts     Problem List Patient Active Problem List   Diagnosis Date Noted   S/P breast reconstruction, bilateral 07/21/2019   Acquired absence of breast 05/22/2019   Breast cancer ([place]) 05/14/2019   Malignant neoplasm of lower-inner quadrant of left breast in female, estrogen receptor positive ([place]) 03/24/2019   Allergic  dermatitis 12/27/2017   Reactive airway disease without complication 24/82/5003   Allergic rhinitis 11/28/2016   Hyperlipidemia 11/28/2016    [name], PT 04/18/2021, 5:09 PM  [place] @ [place], [place], [address] Phone: [phone]   Fax:  [phone]  Name: Rihanna Marseille MRN: [id number] Date of Birth: 08/31/1970

## 2021-04-19 ENCOUNTER — Encounter: Payer: Self-pay | Admitting: Physical Therapy

## 2021-04-19 ENCOUNTER — Ambulatory Visit: Payer: BC Managed Care – PPO | Attending: Radiation Oncology | Admitting: Physical Therapy

## 2021-04-19 DIAGNOSIS — M25612 Stiffness of left shoulder, not elsewhere classified: Secondary | ICD-10-CM | POA: Diagnosis not present

## 2021-04-19 DIAGNOSIS — R293 Abnormal posture: Secondary | ICD-10-CM | POA: Diagnosis present

## 2021-04-19 NOTE — Therapy (Signed)
[facility] °[address] °[place], [place], [address] °Phone: [phone]   Fax:  [phone] ° °Physical Therapy Treatment / Discharge  ° °Patient Details  °Name: Alice Davis °MRN: [id number] °Date of Birth: 09/25/1970 °Referring Provider (PT): Toth ° ° °Encounter Date: 04/19/2021 ° ° PT End of Session - 04/19/21 1501   ° ° Visit Number 31   ° Number of Visits 35   ° Date for PT Re-Evaluation 04/24/21   ° PT Start Time 1503   ° PT Stop Time 1545   ° PT Time Calculation (min) 42 min   ° Activity Tolerance Patient tolerated treatment well   ° Behavior During Therapy WFL for tasks assessed/performed   ° °  °  ° °  ° ° °Past Medical History:  °Diagnosis Date  ° Asthma   ° Cancer (HCC)   ° ° °Past Surgical History:  °Procedure Laterality Date  ° BREAST RECONSTRUCTION WITH PLACEMENT OF TISSUE EXPANDER AND FLEX HD (ACELLULAR HYDRATED DERMIS) Bilateral 05/14/2019  ° Procedure: IMMEDIATE BREAST RECONSTRUCTION WITH PLACEMENT OF TISSUE EXPANDER AND FLEX HD (ACELLULAR HYDRATED DERMIS);  Surgeon: Dillingham, Claire S, DO;  [facility];  Service: Plastics;  Laterality: Bilateral;  ° NASAL SINUS SURGERY    ° 20 years ago  ° NIPPLE SPARING MASTECTOMY WITH SENTINEL LYMPH NODE BIOPSY Bilateral 05/14/2019  ° Procedure: BILATERAL NIPPLE SPARING MASTECTOMIES WITH LEFT SENTINEL LYMPH NODE BIOPSY;  Surgeon: Toth, Paul III, MD;  Location: [facility];  Service: General;  Laterality: Bilateral;  PEC  ° REMOVAL OF BILATERAL TISSUE EXPANDERS WITH PLACEMENT OF BILATERAL BREAST IMPLANTS Bilateral 07/13/2019  ° Procedure: REMOVAL OF BILATERAL TISSUE EXPANDERS WITH PLACEMENT OF BILATERAL BREAST IMPLANTS;  Surgeon: Dillingham, Claire S, DO;  Location: [facility];  Service: Plastics;  Laterality: Bilateral;  ° RHINOPLASTY    ° 20 years  ° WISDOM TOOTH EXTRACTION    ° ° °There were no vitals filed for this visit. ° ° Subjective Assessment - 04/19/21 1502   ° ° Subjective "They measured me  yesterday compared to a year a go an noted I did make some progression. I am unsure if I need to keep coming to PT."   ° 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, grade 2, margins negative, 1/3 lymph node positive ER 80-95 %, PR 80-95 %, HER-2 negative, Ki-67 2-20%right mastectomy: Benign; then bil implant exchange July 13, 2019, pt completed radiation 09/25/19, pt currently tamoxifen   ° Currently in Pain? No/denies   ° Pain Score 0-No pain   ° °  °  ° °  ° ° ° ° ° [facility] PT Assessment - 04/19/21 0001   ° °  ° ROM / Strength  ° AROM / PROM / Strength Strength   °  ° AROM  ° Right Shoulder Flexion 159 Degrees   ° Right Shoulder ABduction 169 Degrees   ° Right Shoulder Internal Rotation --   T2  ° Right Shoulder External Rotation --   T4  ° Left Shoulder Flexion 147 Degrees   ° Left Shoulder ABduction 158 Degrees   ° Left Shoulder Internal Rotation --   T4  ° Left Shoulder External Rotation --   t3  °  ° Strength  ° Strength Assessment Site Shoulder   ° Right/Left Shoulder Right;Left   ° Right Shoulder Flexion 4+/5   ° Right Shoulder Extension 4/5   °   Right Shoulder ABduction 4/5    Right Shoulder Internal Rotation 4+/5    Right Shoulder External Rotation 4+/5    Left Shoulder Flexion 4/5    Left Shoulder Extension 4/5    Left Shoulder ABduction 4/5    Left Shoulder Internal Rotation 4-/5    Left Shoulder External Rotation 4-/5                    [facility] Adult PT Treatment:     04/19/2021 Therapeutic Exercise: Extensively reviewed HEP and updated handout  Manual Therapy:  IASTM along the L axilla and biceps brachii Myofascial stretching    Trigger Point Dry-Needling  04/19/2021 Treatment instructions: Expect mild to moderate muscle soreness. Signs /symptoms of pneumothorax if dry needled over a lung field, and to seek immediate medical attention should they occur. Patient verbalized  understanding of these instructions and education.  Patient Consent Given: Yes Education handout provided: Yes Muscles treated: Cording located along the L axilla and biceps brachii Electrical stimulation performed: No Parameters: N/A Treatment response/outcome: Twitch response elicited and Palpable decrease in muscle tension                        PT Long Term Goals - 03/06/21 1639       PT LONG TERM GOAL #1   Title Pt will demonstrate 160 degrees of left shoulder flexion to allow pt to reach overhead    Status Achieved    Target Date 04/17/21      PT LONG TERM GOAL #2   Title Pt will demonstratse 160 degrees of left shoulder abduction to allow pt to reach out to the sides    Baseline 03/06/2021 140    Period Weeks    Target Date 04/17/21      PT LONG TERM GOAL #3   Title Pt will report a 75% improvement in tightness and discomfort in left lateral trunk with end range shoulder ROM for improved comfort.    Baseline 03/06/2021 55%    Period Weeks    Status On-going    Target Date 04/17/21      PT LONG TERM GOAL #4   Title Pt will be independent in a home exercise program for continued strengthening and stretching    Period Weeks    Status On-going    Target Date 04/17/21      PT LONG TERM GOAL #5   Title Pt will report no pull in the left UE with AROM due to cording    Baseline 03/06/2021 continued cording in the L axilla    Time 6    Period Weeks    Status New    Target Date 04/17/21                   Plan - 04/19/21 1609     Clinical Impression Statement Alice Davis has made excellent progress with physical therapy demonstrating improvement in Left shoulder ROM, and additionally reports not pain. She contineus to report cording that occurs along the L flank and axilla to the cubital fossa. She demosntrats mild limtations with L shoulder ROM compared bil. Time was taken to review her HEP and finished with a final session of TPDN and myofascial  release. She has met or partially met all goals, and is able to maintain and progress her current LOF IND and will be discharged.    PT Treatment/Interventions ADLs/Self Care Home Management;Manual techniques;Patient/family education;Therapeutic exercise;Passive range of motion;Manual lymph drainage;Vasopneumatic  Device;Scar mobilization;Dry needling    PT Next Visit Plan pec stretches, L shoulder ROM, MFR to cording in L axilla - response to DN, anterior GHJ PA, scapular mobs. posterior chaing strengthening, lower trap/ suprapsinatus activation    PT Home Exercise Plan [id number]    Consulted and Agree with Plan of Care Patient             Patient will benefit from skilled therapeutic intervention in order to improve the following deficits and impairments:  Decreased range of motion, Postural dysfunction, Decreased scar mobility, Impaired UE functional use, Increased fascial restricitons, Decreased strength  Visit Diagnosis: Stiffness of left shoulder, not elsewhere classified  Abnormal posture     Problem List Patient Active Problem List   Diagnosis Date Noted   S/P breast reconstruction, bilateral 07/21/2019   Acquired absence of breast 05/22/2019   Breast cancer ([place]) 05/14/2019   Malignant neoplasm of lower-inner quadrant of left breast in female, estrogen receptor positive ([place]) 03/24/2019   Allergic dermatitis 12/27/2017   Reactive airway disease without complication 31/54/0086   Allergic rhinitis 11/28/2016   Hyperlipidemia 11/28/2016   [name] PT, DPT, LAT, ATC  04/19/21  4:26 PM      [facility] [address] [place], [place], [address] Phone: [phone]   Fax:  [phone]  Name: Alice Davis MRN: [id number] Date of Birth: 03-Jan-1971     PHYSICAL THERAPY DISCHARGE SUMMARY  Visits from Start of Care: 31  Current functional level related to goals / functional outcomes: See goals     Remaining deficits: See assessment   Education / Equipment: HEP, theraband, posture, lifting mechanics,    Patient agrees to discharge. Patient goals were met. Patient is being discharged due to being pleased with the current functional level.   Aspyn Warnke PT, DPT, LAT, ATC  04/19/21  4:26 PM

## 2021-04-26 ENCOUNTER — Encounter: Payer: BC Managed Care – PPO | Admitting: Physical Therapy

## 2021-05-02 ENCOUNTER — Encounter: Payer: BC Managed Care – PPO | Admitting: Rehabilitation

## 2021-05-03 ENCOUNTER — Encounter: Payer: BC Managed Care – PPO | Admitting: Physical Therapy

## 2021-05-27 NOTE — Progress Notes (Signed)
Patient Care Team: [name], MD as PCP - General (Family Medicine) [name], MD as Attending Physician (Radiation Oncology) [name], MD as Consulting Physician (Hematology and Oncology) [name], MD as Consulting Physician (General Surgery)  DIAGNOSIS:    ICD-10-CM   1. Malignant neoplasm of lower-inner quadrant of left breast in female, estrogen receptor positive ([place])  C50.312    Z17.0       SUMMARY OF ONCOLOGIC HISTORY: Oncology History  Malignant neoplasm of lower-inner quadrant of left breast in female, estrogen receptor positive ([place])  03/13/2019 Initial Diagnosis   Screening detected left breast abnormalities, 0.6 cm at 8:30 position and 0.9 cm at 8 o'clock position with 1 abnormal left axillary lymph node.  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%, PR 90%, HER-2 -1+, Ki-67 20%; biopsy 8:30 position: ER 80%, PR 80%, Ki-67 5%, HER-2 negative; lymph node biopsy negative   03/31/2019 Cancer Staging   Staging form: Breast, AJCC 8th Edition - Clinical stage from 03/31/2019: Stage IA (cT1b, cN0, cM0, G2, ER+, PR+, HER2-)   05/14/2019 Surgery   Bilateral mastectomies Alice Davis) [phone]):   Left mastectomy: Multifocal invasive lobular cancer largest focus 2.5 cm, grade 2, margins negative, 1/3 lymph node positive ER 80-95 %, PR 80-95 %, HER-2 negative, Ki-67 2-20%  Right mastectomy: Benign   05/21/2019 Cancer Staging   Staging form: Breast, AJCC 8th Edition - Pathologic stage from 05/21/2019: Stage IB (pT2, pN1a, cM0, G2, ER+, PR+, HER2-)   06/01/2019 Oncotype testing   Mammaprint: low risk   08/12/2019 - 09/25/2019 Radiation Therapy   The patient initially received a dose of 50.4 Gy in 28 fractions to the breast using whole-breast tangent fields. She also received 50.4 Gy in 28 fractions to the left supraclavicular region. This was delivered using a 3-D conformal technique. The pt received a boost delivering an additional  10 Gy in 5 fractions using a electron boost with 52mV electrons. The total dose was 60.4 Gy.   09/2019 - 09/2029 Anti-estrogen oral therapy   Tamoxifen     CHIEF COMPLIANT: Follow-up of left breast cancer on tamoxifen  INTERVAL HISTORY: [name] a 51y.o. with above-mentioned history of left breast cancer who underwent a left mastectomy with reconstruction, radiation, and is currently on antiestrogen therapy with tamoxifen. MRI Breast on 01/10/2021 showed no evidence of malignancy in reconstructed breasts bilaterally. She presents to the clinic today for follow-up.  She is tolerating tamoxifen reasonably well even though she does continue to have hot flashes.  ALLERGIES:  is allergic to latex and doxycycline.  MEDICATIONS:  Current Outpatient Medications  Medication Sig Dispense Refill   cetirizine (ZYRTEC) 10 MG tablet Take 10 mg by mouth daily.      Cyanocobalamin (VITAMIN B12 PO) Take 1 tablet by mouth daily.     ELDERBERRY PO Take 15 mLs by mouth daily.     erythromycin with ethanol (EMGEL) 2 % gel Apply topically daily. 30 g 0   Multiple Vitamins-Minerals (MULTIVITAMIN ADULT EXTRA C PO) Take 1 tablet by mouth daily.      tamoxifen (NOLVADEX) 20 MG tablet TAKE 1 TABLET(20 MG) BY MOUTH DAILY 7 tablet 52   VITAMIN D, CHOLECALCIFEROL, PO Take 1 tablet by mouth daily.     No current facility-administered medications for this visit.    PHYSICAL EXAMINATION: ECOG PERFORMANCE STATUS: 1 - Symptomatic but completely ambulatory  Vitals:   05/29/21 1551 05/29/21 1552  BP: (!) 102/31 (Marland Kitchen  102/48  Pulse: 95   Resp: 18   Temp: 99 F (37.2 C)   SpO2: 100%    Filed Weights   05/29/21 1551  Weight: 131 lb 1.6 oz (59.5 kg)    BREAST: No palpable lumps or nodules.  Bilateral breast reconstructions are intact.  Some degree of stiffness in the left axilla.. (exam performed in the presence of a chaperone)  LABORATORY DATA:  I have reviewed the data as listed No flowsheet data  found.  Lab Results  Component Value Date   WBC 6.5 05/11/2019   HGB 14.1 05/11/2019   HCT 44.1 05/11/2019   MCV 93.8 05/11/2019   PLT 279 05/11/2019    ASSESSMENT & PLAN:  Malignant neoplasm of lower-inner quadrant of left breast in female, estrogen receptor positive ([place]) 03/13/2019:Screening detected left breast abnormalities, 0.6 cm at 8:30 position and 0.9 cm at 8 o'clock position with 1 abnormal left axillary lymph node.  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  .   Breast Cancer Surveillance: U/S: Left breast: Indeterminate mass left breast 0.9 cm, Biopsy 03/07/20: Benign 07/17/20: MRI Brain: Neg 07/23/20: CT CAP: No def evidence of Mets, small sclerotic bone islands 07/24/20: Bone scan: No bone mets   Emotional issues: Patient is doing much better since its been a year since her husband had left her.  She is going through the process of divorce.  Breast cancer surveillance: 1.  Breast exam 05/29/2021: Benign  2. breast MRI 01/12/2021: Benign breast density category A We may consider doing breast MRIs every other year. We talked about circulating tumor DNA markers and did not feel like she is the right candidate for  this test.  Patient has been working with a Chief Executive Officer to figure out if her cancer was missed at [facility]. RTC in 1 year    No orders of the defined types were placed in this encounter.  The patient has a good understanding of the overall plan. she agrees with it. she will call with any problems that may develop before the next visit here.  Total time spent: 20 mins including face to face time and time spent for planning, charting and coordination of care  Rulon Eisenmenger, MD, MPH 05/29/2021  I, Thana Ates, am acting as scribe for Dr. [name].  I have reviewed the above documentation for accuracy and completeness, and I agree with the above.

## 2021-05-29 ENCOUNTER — Inpatient Hospital Stay: Payer: BC Managed Care – PPO | Attending: Hematology and Oncology | Admitting: Hematology and Oncology

## 2021-05-29 ENCOUNTER — Other Ambulatory Visit: Payer: Self-pay

## 2021-05-29 DIAGNOSIS — C50312 Malignant neoplasm of lower-inner quadrant of left female breast: Secondary | ICD-10-CM | POA: Diagnosis present

## 2021-05-29 DIAGNOSIS — Z923 Personal history of irradiation: Secondary | ICD-10-CM | POA: Diagnosis not present

## 2021-05-29 DIAGNOSIS — Z79810 Long term (current) use of selective estrogen receptor modulators (SERMs): Secondary | ICD-10-CM | POA: Diagnosis not present

## 2021-05-29 DIAGNOSIS — Z170 Estrogen receptor positive status [ER+]: Secondary | ICD-10-CM | POA: Diagnosis not present

## 2021-05-29 DIAGNOSIS — Z9013 Acquired absence of bilateral breasts and nipples: Secondary | ICD-10-CM | POA: Diagnosis not present

## 2021-05-29 NOTE — Assessment & Plan Note (Signed)
03/13/2019:Screening detected left breast abnormalities, 0.6 cm at 8:30 position and 0.9 cm at 8 o'clock position with 1 abnormal left axillary lymph node. 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 ER 90%, PR 90%, HER-2 -1+, Ki-67 20%; biopsy 8:30 position: ER 80%, PR 80%, Ki-67 5%, HER-2 negative; lymph node biopsy negative T1 BN 0 stage Ia clinical stage  Bilateral mastectomies: Left mastectomy: Multifocal invasive lobular cancer largest focus 2.5 cm, grade 2, margins negative, 1/3 lymph node positive ER 80-95 %, PR 80-95 %, HER-2 negative, Ki-67 2-20% right mastectomy: Benign T2 N1 a stage Ib  MammaPrint: Low risk Adjuvant radiation: 08/12/2019-09/25/2019  Treatment plan:Adjuvant antiestrogen therapy: We discussed tamoxifen for 10 years versus tamoxifen for 3 years followed by aromatase inhibitor for 5 years if she is menopausal  TamoxifenToxicities:Joint stiffness which may be related to tamoxifen. Skin dryness. Fatigue: I instructed her to take B12 1000 mcg sublingual supplement. I discussed with her about switching the time of day that she takes tamoxifen.  Breast Cancer Surveillance: U/S: Left breast: Indeterminate mass left breast 0.9 cm, Biopsy 03/07/20: Benign  Abdominal pain, bone pain, pelvic pain, left epigastric pain:   07/17/20: MRI Brain: Neg 07/23/20: CT CAP: No def evidence of Mets, small sclerotic bone islands 07/24/20: Bone scan: No bone mets  She is under a lot of stress since her husband left her a week ago.  Her son accompanied her to the clinic visit today.  Breast cancer surveillance: 1.  Breast exam 05/29/2021: Benign  2. breast MRI 01/12/2021: Benign breast density category A  RTC in 1 year

## 2021-06-01 ENCOUNTER — Other Ambulatory Visit: Payer: Self-pay | Admitting: Hematology and Oncology

## 2021-06-01 MED ORDER — CYCLOBENZAPRINE HCL 5 MG PO TABS: 5.0000 mg | ORAL_TABLET | Freq: Three times a day (TID) | ORAL | 0 refills | Status: DC | PRN

## 2021-06-19 ENCOUNTER — Ambulatory Visit: Payer: BC Managed Care – PPO | Attending: Radiation Oncology

## 2021-06-19 ENCOUNTER — Other Ambulatory Visit: Payer: Self-pay

## 2021-06-19 VITALS — Wt 131.1 lb

## 2021-06-19 DIAGNOSIS — Z483 Aftercare following surgery for neoplasm: Secondary | ICD-10-CM | POA: Insufficient documentation

## 2021-06-19 NOTE — Therapy (Signed)
[facility] [place], [place], [address] Phone: [phone]   Fax:  [phone]  Physical Therapy Treatment  Patient Details  Name: Alice Davis MRN: [id number] Date of Birth: 03/10/1971 Referring Provider (PT): Joaquin Courts Date: 06/19/2021   PT End of Session - 06/19/21 1710     Visit Number 30   # unchanged due to screen only   PT Start Time 1706    PT Stop Time 1715    PT Time Calculation (min) 9 min    Activity Tolerance Patient tolerated treatment well    Behavior During Therapy [facility] for tasks assessed/performed             Past Medical History:  Diagnosis Date   Asthma    Cancer ([place])     Past Surgical History:  Procedure Laterality Date   BREAST RECONSTRUCTION WITH PLACEMENT OF TISSUE EXPANDER AND FLEX HD (ACELLULAR HYDRATED DERMIS) Bilateral 05/14/2019   Procedure: IMMEDIATE BREAST RECONSTRUCTION WITH PLACEMENT OF TISSUE EXPANDER AND FLEX HD (ACELLULAR HYDRATED DERMIS);  Surgeon: Wallace Going, DO;  Location: [facility];  Service: Plastics;  Laterality: Bilateral;   NASAL SINUS SURGERY     20 years ago   NIPPLE SPARING MASTECTOMY WITH SENTINEL LYMPH NODE BIOPSY Bilateral 05/14/2019   Procedure: BILATERAL NIPPLE SPARING MASTECTOMIES WITH LEFT SENTINEL LYMPH NODE BIOPSY;  Surgeon: [name], MD;  Location: [facility];  Service: General;  Laterality: Bilateral;  PEC   REMOVAL OF BILATERAL TISSUE EXPANDERS WITH PLACEMENT OF BILATERAL BREAST IMPLANTS Bilateral 07/13/2019   Procedure: REMOVAL OF BILATERAL TISSUE EXPANDERS WITH PLACEMENT OF BILATERAL BREAST IMPLANTS;  Surgeon: Wallace Going, DO;  Location: [facility];  Service: Plastics;  Laterality: Bilateral;   RHINOPLASTY     20 years   WISDOM TOOTH EXTRACTION      Vitals:   06/19/21 1709  Weight: 131 lb 2 oz (59.5 kg)     Subjective Assessment - 06/19/21 1708     Subjective Pt returns for her 3 month L-Dex screen.     Pertinent History January 7t, 2021 Bil Masectomy with 3 lymph node removal,Left mastectomy: Multifocal invasive lobular cancer largest focus 2.5 cm, grade 2, margins negative, 1/3 lymph node positive ER 80-95 %, PR 80-95 %, HER-2 negative, Ki-67 2-20%right mastectomy: Benign; then bil implant exchange July 13, 2019, pt completed radiation 09/25/19, pt currently tamoxifen                    L-DEX FLOWSHEETS - 06/19/21 1700       L-DEX LYMPHEDEMA SCREENING   Measurement Type Unilateral    L-DEX MEASUREMENT EXTREMITY Upper Extremity    POSITION  Standing    DOMINANT SIDE Right    At Risk Side Left    BASELINE SCORE (UNILATERAL) 0.8    L-DEX SCORE (UNILATERAL) 4.9    VALUE CHANGE (UNILAT) 4.1                                     PT Long Term Goals - 02/01/21 1607       PT LONG TERM GOAL #1   Title Pt will demonstrate 160 degrees of left shoulder flexion to allow pt to reach overhead    Baseline 144; 02/01/21- 170    Time 4    Period Weeks    Status Achieved  PT LONG TERM GOAL #2   Title Pt will demonstratse 160 degrees of left shoulder abduction to allow pt to reach out to the sides    Baseline 128; 02/01/21- 156    Time 4    Period Weeks    Status On-going      PT LONG TERM GOAL #3   Title Pt will report a 75% improvement in tightness and discomfort in left lateral trunk with end range shoulder ROM for improved comfort.    Baseline 02/01/21- 50%    Time 4    Period Weeks    Status On-going      PT LONG TERM GOAL #4   Title Pt will be independent in a home exercise program for continued strengthening and stretching    Time 4    Period Weeks    Status On-going      PT LONG TERM GOAL #5   Title Pt will report no pull in the left UE with AROM due to cording    Baseline 01/04/21- pt still has cording in L axilla that causes tightness at end range; 9/28- pt still feels pulling due to cording    Time 4    Period Weeks    Status  On-going                   Plan - 06/19/21 1716     Clinical Impression Statement Pt returns for her 3 month L-Dex screen. Her change baseline of 4.1 is WNLs so no further treatment is required at this time. Pt is now 2 years out from her SLNB so will have 6 month screens for next year.    PT Next Visit Plan Cont L-Dex screens every 6 months for next year, then annual screens.    Consulted and Agree with Plan of Care Patient             Patient will benefit from skilled therapeutic intervention in order to improve the following deficits and impairments:     Visit Diagnosis: Aftercare following surgery for neoplasm     Problem List Patient Active Problem List   Diagnosis Date Noted   S/P breast reconstruction, bilateral 07/21/2019   Acquired absence of breast 05/22/2019   Breast cancer ([facility]) 05/14/2019   Malignant neoplasm of lower-inner quadrant of left breast in female, estrogen receptor positive ([place]) 03/24/2019   Allergic dermatitis 12/27/2017   Reactive airway disease without complication 90/93/1121   Allergic rhinitis 11/28/2016   Hyperlipidemia 11/28/2016    Otelia Limes, PTA 06/19/2021, 5:21 PM  [facility] @ [place], [place], [address] Phone: [phone]   Fax:  [phone]  Name: Alice Davis MRN: [id number] Date of Birth: Jun 24, 1970

## 2021-08-04 ENCOUNTER — Ambulatory Visit: Payer: BC Managed Care – PPO | Admitting: Hematology and Oncology

## 2021-08-09 ENCOUNTER — Telehealth: Payer: Self-pay | Admitting: *Deleted

## 2021-08-09 NOTE — Telephone Encounter (Signed)
Upbeat Study: LVM for patient to schedule the 24 months visit.  Requested patient call back to schedule when she gets a chance.  ?Foye Spurling, BSN, RN, CCRP ?Clinical Research Nurse II ?08/09/2021 9:58 AM ? ?

## 2021-08-11 ENCOUNTER — Other Ambulatory Visit (HOSPITAL_COMMUNITY): Payer: Self-pay | Admitting: Hematology and Oncology

## 2021-08-11 ENCOUNTER — Telehealth: Payer: Self-pay | Admitting: *Deleted

## 2021-08-11 ENCOUNTER — Encounter: Payer: Self-pay | Admitting: *Deleted

## 2021-08-11 DIAGNOSIS — Z006 Encounter for examination for normal comparison and control in clinical research program: Secondary | ICD-10-CM

## 2021-08-11 DIAGNOSIS — Z170 Estrogen receptor positive status [ER+]: Secondary | ICD-10-CM

## 2021-08-11 NOTE — Research (Signed)
Opened encounter in error  

## 2021-08-11 NOTE — Telephone Encounter (Signed)
LVM for patient with details of scheduled 24 months Upbeat visit. Instructed patient to fast for at least 3 hrs prior to 8 am lab appointment. Reminded her to wear comfortable shoes and asked her to call back if she prefers to complete the questionnaires online instead of in the clinic. Asked patient to call back if any questions or she needs to change this appointment.  ?Foye Spurling, BSN, RN, CCRP ?Clinical Research Nurse II ?08/11/2021 12:59 PM ? ?

## 2021-08-21 DIAGNOSIS — Z170 Estrogen receptor positive status [ER+]: Secondary | ICD-10-CM | POA: Diagnosis not present

## 2021-08-21 DIAGNOSIS — C50912 Malignant neoplasm of unspecified site of left female breast: Secondary | ICD-10-CM | POA: Diagnosis not present

## 2021-08-30 ENCOUNTER — Telehealth: Payer: Self-pay | Admitting: *Deleted

## 2021-08-30 NOTE — Telephone Encounter (Signed)
Upbeat Study: Patient confirmed appointments for Friday morning starting at 8 am. We discussed that the MRI should not be billed to patient or her insurance. It should be billed to the study. Informed patient that research nurse has communicated with our Lead Coder at the [facility] to make aware that MRI this Friday needs to be billed to the study.  Reminded patient to fast for at lest 3 hours prior to the lab and call research nurse if any questions or she needs to change this appointment. Patient verbalized understanding.  ?Foye Spurling, BSN, RN, CCRP ?Clinical Research Nurse II ?08/30/2021 1:25 PM ? ?

## 2021-09-01 ENCOUNTER — Inpatient Hospital Stay: Payer: BC Managed Care – PPO | Attending: Hematology and Oncology

## 2021-09-01 ENCOUNTER — Inpatient Hospital Stay: Payer: BC Managed Care – PPO | Admitting: *Deleted

## 2021-09-01 ENCOUNTER — Other Ambulatory Visit: Payer: Self-pay

## 2021-09-01 ENCOUNTER — Ambulatory Visit (HOSPITAL_COMMUNITY)
Admission: RE | Admit: 2021-09-01 | Discharge: 2021-09-01 | Disposition: A | Discharge status: Home / Self Care | Payer: BC Managed Care – PPO | Source: Ambulatory Visit | Attending: Hematology and Oncology | Admitting: Hematology and Oncology

## 2021-09-01 VITALS — BP 112/66 | HR 88 | Ht 67.0 in | Wt 130.8 lb

## 2021-09-01 DIAGNOSIS — C50312 Malignant neoplasm of lower-inner quadrant of left female breast: Secondary | ICD-10-CM

## 2021-09-01 DIAGNOSIS — Z006 Encounter for examination for normal comparison and control in clinical research program: Secondary | ICD-10-CM

## 2021-09-01 LAB — RESEARCH LABS

## 2021-09-01 NOTE — Research (Signed)
[id number] UPBEAT STUDY 24 Months Assessments:  Patient in clinic by herself this afternoon to complete the activities for the Upbeat study.  ? ?PROs; Given to patient to complete in clinic. Research nurse collected and checked for completion and accuracy at the end of the visit.  ?Covid-19 Questionnaire; Patient answered questions in clinic.  ?Lab; Research specimens collected per protocol.  Patient confirmed she had been fasting for at least 3 hours prior to blood collection.  ?VS; Collected per protocol. See VS flowsheet. ?Waist Measurement;  30 inches.  ?Concomitant Meds; Current medication list reviewed with patient. Medication list is up to date and patient confirms she continues to take Tamoxifen daily as prescribed.  ?Physical Functions Testing; Completed without difficulty.  ?Neurocognitive Testing; Administered by Carol Ada, CRC.   ?Cardiovascular Events; Patient denies any hospitalizations or cardiovascular events since last visit.  ?Cardiac MRI; Completed per protocol.  ?Gift Card; $25 [employer] gift card given to patient for completing the 24 months activities on this study.  ?Plan;  Follow up phone calls due yearly for 9 more years.  Encouraged patient to call research nurse if any questions prior to next call. Thanked patient for her participation in this study. She verbalized understanding. ?Foye Spurling, BSN, RN ?Clinical Research Nurse ?09/01/2021  ? ? ? ? ? ? ?

## 2021-11-20 ENCOUNTER — Other Ambulatory Visit: Payer: Self-pay | Admitting: *Deleted

## 2021-11-20 MED ORDER — TAMOXIFEN CITRATE 20 MG PO TABS
ORAL_TABLET | ORAL | 52 refills | Status: DC
Start: 2021-11-20 — End: 2022-03-01

## 2021-11-21 ENCOUNTER — Ambulatory Visit: Payer: BC Managed Care – PPO | Admitting: Plastic Surgery

## 2021-11-21 DIAGNOSIS — Z9013 Acquired absence of bilateral breasts and nipples: Secondary | ICD-10-CM | POA: Diagnosis not present

## 2021-11-21 DIAGNOSIS — Z170 Estrogen receptor positive status [ER+]: Secondary | ICD-10-CM | POA: Diagnosis not present

## 2021-11-21 DIAGNOSIS — C50312 Malignant neoplasm of lower-inner quadrant of left female breast: Secondary | ICD-10-CM | POA: Diagnosis not present

## 2021-11-21 DIAGNOSIS — Z9889 Other specified postprocedural states: Secondary | ICD-10-CM

## 2021-11-21 NOTE — Progress Notes (Signed)
Patient ID: Alice Davis, female    DOB: 30-Aug-1970, 51 y.o.   MRN: [id number]   Chief Complaint  Patient presents with   Follow-up    The patient is a 51 year old female here for her 1 year follow-up after breast reconstruction.  She was diagnosed with invasive mammary carcinoma of the left breast.  She underwent mastectomies in January 2021 and then exchange to implants in March 2021.  She has Mentor smooth round high-profile extra gel 415 cc implants on both sides.  She is 5 feet 7 inches tall and is very pleased with her results.  She was estrogen and progesterone positive and HER2 negative.  She was radiated on the left side. There are no areas of concern.    Review of Systems  Constitutional: Negative.  Negative for activity change.  Eyes: Negative.   Respiratory:  Negative for chest tightness.   Cardiovascular: Negative.  Negative for leg swelling.  Gastrointestinal: Negative.   Endocrine: Negative.   Genitourinary: Negative.   Musculoskeletal: Negative.   Skin: Negative.   Hematological: Negative.   Psychiatric/Behavioral: Negative.      Past Medical History:  Diagnosis Date   Asthma    [facility])     Past Surgical History:  Procedure Laterality Date   BREAST RECONSTRUCTION WITH PLACEMENT OF TISSUE EXPANDER AND FLEX HD (ACELLULAR HYDRATED DERMIS) Bilateral 05/14/2019   Procedure: IMMEDIATE BREAST RECONSTRUCTION WITH PLACEMENT OF TISSUE EXPANDER AND FLEX HD (ACELLULAR HYDRATED DERMIS);  Surgeon: Wallace Going, DO;  Location: [place];  Service: Plastics;  Laterality: Bilateral;   NASAL SINUS SURGERY     20 years ago   NIPPLE SPARING MASTECTOMY WITH SENTINEL LYMPH NODE BIOPSY Bilateral 05/14/2019   Procedure: BILATERAL NIPPLE SPARING MASTECTOMIES WITH LEFT SENTINEL LYMPH NODE BIOPSY;  Surgeon: [name], MD;  Location: [place];  Service: General;  Laterality: Bilateral;  PEC   REMOVAL OF BILATERAL TISSUE EXPANDERS WITH PLACEMENT OF BILATERAL BREAST IMPLANTS  Bilateral 07/13/2019   Procedure: REMOVAL OF BILATERAL TISSUE EXPANDERS WITH PLACEMENT OF BILATERAL BREAST IMPLANTS;  Surgeon: Wallace Going, DO;  Location: [place];  Service: Plastics;  Laterality: Bilateral;   RHINOPLASTY     20 years   WISDOM TOOTH EXTRACTION        Current Outpatient Medications:    cetirizine (ZYRTEC) 10 MG tablet, Take 10 mg by mouth daily. , Disp: , Rfl:    Cyanocobalamin (VITAMIN B12 PO), Take 1 tablet by mouth daily., Disp: , Rfl:    cyclobenzaprine (FLEXERIL) 5 MG tablet, Take 1 tablet (5 mg total) by mouth 3 (three) times daily as needed for muscle spasms., Disp: 30 tablet, Rfl: 0   ELDERBERRY PO, Take 15 mLs by mouth daily., Disp: , Rfl:    erythromycin with ethanol (EMGEL) 2 % gel, Apply topically daily., Disp: 30 g, Rfl: 0   Multiple Vitamins-Minerals (MULTIVITAMIN ADULT EXTRA C PO), Take 1 tablet by mouth daily. , Disp: , Rfl:    tamoxifen (NOLVADEX) 20 MG tablet, TAKE 1 TABLET(20 MG) BY MOUTH DAILY, Disp: 7 tablet, Rfl: 52   VITAMIN D, CHOLECALCIFEROL, PO, Take 1 tablet by mouth daily., Disp: , Rfl:    Objective:   There were no vitals filed for this visit.  Physical Exam Constitutional:      Appearance: Normal appearance.  Cardiovascular:     Rate and Rhythm: Normal rate.     Pulses: Normal pulses.  Abdominal:     Palpations: Abdomen is soft.  Tenderness: There is no abdominal tenderness.  Musculoskeletal:        General: No swelling or deformity.  Skin:    General: Skin is warm.     Capillary Refill: Capillary refill takes less than 2 seconds.     Coloration: Skin is not jaundiced.  Neurological:     Mental Status: She is alert and oriented to person, place, and time.  Psychiatric:        Mood and Affect: Mood normal.        Behavior: Behavior normal.        Thought Content: Thought content normal.     Assessment & Plan:  S/P breast reconstruction, bilateral  Malignant neoplasm of lower-inner quadrant of  left breast in female, estrogen receptor positive ([place])  Acquired absence of both breasts  She had an ultrasound and a mammogram last year so we will need to do that.  We talked about the recommendations for ultrasound every 3 years.  That is an option but not something she has to do.  Continue with massage and sports bras for the most part.  She is doing fabulous and seems to be very happy.  We will see her back in 1 year.  [place], DO

## 2021-11-28 NOTE — Telephone Encounter (Signed)
error 

## 2021-12-18 ENCOUNTER — Ambulatory Visit: Payer: BC Managed Care – PPO | Attending: Plastic Surgery

## 2021-12-18 VITALS — Wt 133.1 lb

## 2021-12-18 DIAGNOSIS — Z483 Aftercare following surgery for neoplasm: Secondary | ICD-10-CM | POA: Insufficient documentation

## 2021-12-18 NOTE — Therapy (Signed)
OUTPATIENT PHYSICAL THERAPY SOZO SCREENING NOTE   Patient Name: Alice Davis MRN: [id number] DOB:31-Dec-1970, 51 y.o., female Today's Date: 12/18/2021  PCP: [name], MD REFERRING PROVIDER: Wallace Going, DO   PT End of Session - 12/18/21 1649     Visit Number 30   # unchanged due to screen only   PT Start Time 1645    PT Stop Time 1650    PT Time Calculation (min) 5 min    Activity Tolerance Patient tolerated treatment well    Behavior During Therapy WFL for tasks assessed/performed             Past Medical History:  Diagnosis Date   Asthma    Cancer ([place])    Past Surgical History:  Procedure Laterality Date   BREAST RECONSTRUCTION WITH PLACEMENT OF TISSUE EXPANDER AND FLEX HD (ACELLULAR HYDRATED DERMIS) Bilateral 05/14/2019   Procedure: IMMEDIATE BREAST RECONSTRUCTION WITH PLACEMENT OF TISSUE EXPANDER AND FLEX HD (ACELLULAR HYDRATED DERMIS);  Surgeon: Wallace Going, DO;  Location: [place];  Service: Plastics;  Laterality: Bilateral;   NASAL SINUS SURGERY     20 years ago   NIPPLE SPARING MASTECTOMY WITH SENTINEL LYMPH NODE BIOPSY Bilateral 05/14/2019   Procedure: BILATERAL NIPPLE SPARING MASTECTOMIES WITH LEFT SENTINEL LYMPH NODE BIOPSY;  Surgeon: [name], MD;  Location: [place];  Service: General;  Laterality: Bilateral;  PEC   REMOVAL OF BILATERAL TISSUE EXPANDERS WITH PLACEMENT OF BILATERAL BREAST IMPLANTS Bilateral 07/13/2019   Procedure: REMOVAL OF BILATERAL TISSUE EXPANDERS WITH PLACEMENT OF BILATERAL BREAST IMPLANTS;  Surgeon: Wallace Going, DO;  Location: [place];  Service: Plastics;  Laterality: Bilateral;   RHINOPLASTY     20 years   WISDOM TOOTH EXTRACTION     Patient Active Problem List   Diagnosis Date Noted   S/P breast reconstruction, bilateral 07/21/2019   Acquired absence of breast 05/22/2019   Breast cancer ([place]) 05/14/2019   Malignant neoplasm of lower-inner quadrant of left breast in female, estrogen  receptor positive ([place]) 03/24/2019   Allergic dermatitis 12/27/2017   Reactive airway disease without complication 78/29/5621   Allergic rhinitis 11/28/2016   Hyperlipidemia 11/28/2016    REFERRING DIAG: left breast cancer at risk for lymphedema  THERAPY DIAG:  Aftercare following surgery for neoplasm  PERTINENT HISTORY: January 7t, 2021 Bil Masectomy with 3 lymph node removal,Left mastectomy: 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   PAIN:  Are you having pain? No  SOZO SCREENING: Patient was assessed today using the SOZO machine to determine the lymphedema index score. This was compared to her baseline score. It was determined that she is within the recommended range when compared to her baseline and no further action is needed at this time. She will continue SOZO screenings. These are done every 3 months for 2 years post operatively followed by every 6 months for 2 years, and then annually.   L-DEX FLOWSHEETS - 12/18/21 1600       L-DEX LYMPHEDEMA SCREENING   Measurement Type Unilateral    L-DEX MEASUREMENT EXTREMITY Upper Extremity    POSITION  Standing    DOMINANT SIDE Right    At Risk Side Left  BASELINE SCORE (UNILATERAL) 0.8    L-DEX SCORE (UNILATERAL) -2.1    VALUE CHANGE (UNILAT) -2.9              Otelia Limes, PTA 12/18/2021, 4:54 PM

## 2022-01-10 DIAGNOSIS — H52223 Regular astigmatism, bilateral: Secondary | ICD-10-CM | POA: Diagnosis not present

## 2022-01-22 DIAGNOSIS — H11441 Conjunctival cysts, right eye: Secondary | ICD-10-CM | POA: Diagnosis not present

## 2022-02-28 ENCOUNTER — Other Ambulatory Visit: Payer: Self-pay | Admitting: Hematology and Oncology

## 2022-02-28 DIAGNOSIS — Z1151 Encounter for screening for human papillomavirus (HPV): Secondary | ICD-10-CM | POA: Diagnosis not present

## 2022-02-28 DIAGNOSIS — Z124 Encounter for screening for malignant neoplasm of cervix: Secondary | ICD-10-CM | POA: Diagnosis not present

## 2022-02-28 DIAGNOSIS — Z01419 Encounter for gynecological examination (general) (routine) without abnormal findings: Secondary | ICD-10-CM | POA: Diagnosis not present

## 2022-02-28 DIAGNOSIS — Z6820 Body mass index (BMI) 20.0-20.9, adult: Secondary | ICD-10-CM | POA: Diagnosis not present

## 2022-03-02 DIAGNOSIS — Z0000 Encounter for general adult medical examination without abnormal findings: Secondary | ICD-10-CM | POA: Diagnosis not present

## 2022-03-02 DIAGNOSIS — Z13220 Encounter for screening for lipoid disorders: Secondary | ICD-10-CM | POA: Diagnosis not present

## 2022-03-08 DIAGNOSIS — L700 Acne vulgaris: Secondary | ICD-10-CM | POA: Diagnosis not present

## 2022-03-08 DIAGNOSIS — L719 Rosacea, unspecified: Secondary | ICD-10-CM | POA: Diagnosis not present

## 2022-03-08 DIAGNOSIS — D225 Melanocytic nevi of trunk: Secondary | ICD-10-CM | POA: Diagnosis not present

## 2022-03-08 DIAGNOSIS — L578 Other skin changes due to chronic exposure to nonionizing radiation: Secondary | ICD-10-CM | POA: Diagnosis not present

## 2022-03-13 DIAGNOSIS — Z13820 Encounter for screening for osteoporosis: Secondary | ICD-10-CM | POA: Diagnosis not present

## 2022-03-21 DIAGNOSIS — H00021 Hordeolum internum right upper eyelid: Secondary | ICD-10-CM | POA: Diagnosis not present

## 2022-05-04 ENCOUNTER — Telehealth: Payer: Self-pay | Admitting: Hematology and Oncology

## 2022-05-04 NOTE — Telephone Encounter (Signed)
Rescheduled appointment per provider PAL. Patient is aware of the changes made to her upcoming appointment. 

## 2022-05-23 ENCOUNTER — Telehealth: Payer: Self-pay | Admitting: Hematology and Oncology

## 2022-05-23 NOTE — Telephone Encounter (Signed)
Rescheduled appointment per provider PAL. Left voicemail.

## 2022-05-30 ENCOUNTER — Ambulatory Visit: Payer: BC Managed Care – PPO | Admitting: Hematology and Oncology

## 2022-05-30 ENCOUNTER — Other Ambulatory Visit: Payer: Self-pay | Admitting: *Deleted

## 2022-05-30 DIAGNOSIS — Z170 Estrogen receptor positive status [ER+]: Secondary | ICD-10-CM

## 2022-05-31 ENCOUNTER — Inpatient Hospital Stay: Payer: BC Managed Care – PPO | Admitting: Hematology and Oncology

## 2022-05-31 ENCOUNTER — Other Ambulatory Visit: Payer: Self-pay

## 2022-05-31 ENCOUNTER — Inpatient Hospital Stay: Payer: BC Managed Care – PPO | Attending: Hematology and Oncology

## 2022-05-31 VITALS — BP 117/65 | HR 89 | Temp 97.0°F | Resp 15 | Wt 135.9 lb

## 2022-05-31 DIAGNOSIS — C50312 Malignant neoplasm of lower-inner quadrant of left female breast: Secondary | ICD-10-CM | POA: Insufficient documentation

## 2022-05-31 DIAGNOSIS — Z170 Estrogen receptor positive status [ER+]: Secondary | ICD-10-CM

## 2022-05-31 DIAGNOSIS — Z9013 Acquired absence of bilateral breasts and nipples: Secondary | ICD-10-CM | POA: Diagnosis not present

## 2022-05-31 DIAGNOSIS — Z881 Allergy status to other antibiotic agents status: Secondary | ICD-10-CM | POA: Diagnosis not present

## 2022-05-31 DIAGNOSIS — Z79899 Other long term (current) drug therapy: Secondary | ICD-10-CM | POA: Insufficient documentation

## 2022-05-31 DIAGNOSIS — Z79810 Long term (current) use of selective estrogen receptor modulators (SERMs): Secondary | ICD-10-CM | POA: Insufficient documentation

## 2022-05-31 LAB — CBC WITH DIFFERENTIAL (CANCER CENTER ONLY)
Abs Immature Granulocytes: 0.03 10*3/uL (ref 0.00–0.07)
Basophils Absolute: 0.1 10*3/uL (ref 0.0–0.1)
Basophils Relative: 1 %
Eosinophils Absolute: 0.2 10*3/uL (ref 0.0–0.5)
Eosinophils Relative: 4 %
HCT: 39.1 % (ref 36.0–46.0)
Hemoglobin: 12.8 g/dL (ref 12.0–15.0)
Immature Granulocytes: 1 %
Lymphocytes Relative: 25 %
Lymphs Abs: 1.6 10*3/uL (ref 0.7–4.0)
MCH: 30 pg (ref 26.0–34.0)
MCHC: 32.7 g/dL (ref 30.0–36.0)
MCV: 91.8 fL (ref 80.0–100.0)
Monocytes Absolute: 0.5 10*3/uL (ref 0.1–1.0)
Monocytes Relative: 8 %
Neutro Abs: 3.8 10*3/uL (ref 1.7–7.7)
Neutrophils Relative %: 61 %
Platelet Count: 236 10*3/uL (ref 150–400)
RBC: 4.26 MIL/uL (ref 3.87–5.11)
RDW: 12.3 % (ref 11.5–15.5)
WBC Count: 6.1 10*3/uL (ref 4.0–10.5)
nRBC: 0 % (ref 0.0–0.2)

## 2022-05-31 LAB — CMP (CANCER CENTER ONLY)
ALT: 10 U/L (ref 0–44)
AST: 14 U/L — ABNORMAL LOW (ref 15–41)
Albumin: 4.1 g/dL (ref 3.5–5.0)
Alkaline Phosphatase: 64 U/L (ref 38–126)
Anion gap: 4 — ABNORMAL LOW (ref 5–15)
BUN: 11 mg/dL (ref 6–20)
CO2: 31 mmol/L (ref 22–32)
Calcium: 9.5 mg/dL (ref 8.9–10.3)
Chloride: 105 mmol/L (ref 98–111)
Creatinine: 0.83 mg/dL (ref 0.44–1.00)
GFR, Estimated: >60 mL/min (ref >60)
Glucose, Bld: 86 mg/dL (ref 70–99)
Potassium: 4.1 mmol/L (ref 3.5–5.1)
Sodium: 140 mmol/L (ref 135–145)
Total Bilirubin: 0.3 mg/dL (ref 0.3–1.2)
Total Protein: 7.3 g/dL (ref 6.5–8.1)

## 2022-05-31 MED ORDER — CYCLOBENZAPRINE HCL 5 MG PO TABS: 5.0000 mg | ORAL_TABLET | Freq: Three times a day (TID) | ORAL | 0 refills | Status: DC | PRN

## 2022-05-31 NOTE — Progress Notes (Signed)
Patient Care Team: Alice Rasmussen, MD as PCP - General (Family Medicine) Alice Gibson, MD as Attending Physician (Radiation Oncology) Alice Lose, MD as Consulting Physician (Hematology and Oncology) Alice Kussmaul, MD as Consulting Physician (General Surgery)  DIAGNOSIS:  Encounter Diagnosis  Name Primary?   Malignant neoplasm of lower-inner quadrant of left breast in female, estrogen receptor positive ([place]) Yes    SUMMARY OF ONCOLOGIC HISTORY: Oncology History  Malignant neoplasm of lower-inner quadrant of left breast in female, estrogen receptor positive ([place])  03/13/2019 Initial Diagnosis   Screening detected left breast abnormalities, 0.6 cm at 8:30 position and 0.9 cm at 8 o'clock position with 1 abnormal left axillary lymph node.  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 She also received 50.4 Gy in 28 fractions to the left supraclavicular region. This was delivered using a 3-D conformal technique. The pt received a boost delivering an  additional 10 Gy in 5 fractions using a electron boost with 74mV electrons. The total dose was 60.4 Gy.   09/2019 - 09/2029 Anti-estrogen oral therapy   Tamoxifen     CHIEF COMPLIANT: Follow-up of left breast cancer on tamoxifen    INTERVAL HISTORY: [name] a 52y.o. with above-mentioned history of left breast cancer who underwent a left mastectomy with reconstruction, radiation, and is currently on antiestrogen therapy with tamoxifen. She presents to the clinic today for follow-up. She reports that she have not have irregular cycles and mild complains of mild headaches, but she is tolerating tamoxifen with no other side effects. She says she is still tender around the left breast.   ALLERGIES:  is allergic to latex and doxycycline.  MEDICATIONS:  Current Outpatient Medications  Medication Sig Dispense Refill   albuterol (VENTOLIN HFA) 108 (90 Base) MCG/ACT inhaler Inhale 2 puffs into the lungs every 4 (four) hours as needed.     cetirizine (ZYRTEC) 10 MG tablet Take 10 mg by mouth daily.      Cholecalciferol (VITAMIN D) 10 MCG/ML LIQD Vitamin D     cyclobenzaprine (FLEXERIL) 5 MG tablet Take 1 tablet (5 mg total) by mouth 3 (three) times daily as needed for muscle spasms. 30 tablet 0   ELDERBERRY PO Take 15 mLs by mouth daily.     erythromycin with ethanol (EMGEL) 2 % gel Apply topically daily. 30 g 0   fluocinonide (LIDEX) 0.05 % external solution APPLY TOPICALLY TO THE SCALP EVERY DAY AS NEEDED     Multiple Vitamins-Minerals (MULTIVITAMIN ADULT EXTRA C PO) Take 1 tablet by mouth daily.  tamoxifen (NOLVADEX) 20 MG tablet TAKE 1 TABLET(20 MG) BY MOUTH DAILY 90 tablet 1   VITAMIN D, CHOLECALCIFEROL, PO Take 1 tablet by mouth daily.     No current facility-administered medications for this visit.    PHYSICAL EXAMINATION: ECOG PERFORMANCE STATUS: 1 - Symptomatic but completely ambulatory  Vitals:   05/31/22 1424  BP: 117/65  Pulse: 89  Resp: 15  Temp: (!) 97 F  (36.1 C)  SpO2: 98%   Filed Weights   05/31/22 1424  Weight: 135 lb 14.4 oz (61.6 kg)    BREAST: Bilateral recons to clear breast without any palpable lobe nodules of concern.  There is a small skin papule 11 o'clock position right breast which was not felt to be malignant.  (exam performed in the presence of a chaperone)  LABORATORY DATA:  I have reviewed the data as listed    Latest Ref Rng & Units 05/31/2022    2:16 PM  CMP  Glucose 70 - 99 mg/dL 86   BUN 6 - 20 mg/dL 11   Creatinine 0.44 - 1.00 mg/dL 0.83   Sodium 135 - 145 mmol/L 140   Potassium 3.5 - 5.1 mmol/L 4.1   Chloride 98 - 111 mmol/L 105   CO2 22 - 32 mmol/L 31   Calcium 8.9 - 10.3 mg/dL 9.5   Total Protein 6.5 - 8.1 g/dL 7.3   Total Bilirubin 0.3 - 1.2 mg/dL 0.3   Alkaline Phos 38 - 126 U/L 64   AST 15 - 41 U/L 14   ALT 0 - 44 U/L 10     Lab Results  Component Value Date   WBC 6.1 05/31/2022   HGB 12.8 05/31/2022   HCT 39.1 05/31/2022   MCV 91.8 05/31/2022   PLT 236 05/31/2022   NEUTROABS 3.8 05/31/2022    ASSESSMENT & PLAN:  Malignant neoplasm of lower-inner quadrant of left breast in female, estrogen receptor positive ([place]) 03/13/2019:Screening detected left breast abnormalities, 0.6 cm at 8:30 position and 0.9 cm at 8 o'clock position with 1 abnormal left axillary lymph node.  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 .   Breast Cancer Surveillance: U/S: Left breast: Indeterminate mass left breast 0.9 cm, Biopsy 03/07/20: Benign 07/17/20: MRI Brain: Neg 07/23/20: CT CAP: No def evidence of Mets, small sclerotic bone islands 07/24/20: Bone scan: No bone mets   Emotional issues: Patient is doing much better since its been a year since her husband had left her.  She is definitely happier and appears to be at peace.   Breast cancer surveillance: 1.  Breast exam 05/31/2022 benign  2. breast MRI 01/12/2021: Benign breast density category A We decided to obtain Signatera testing for minimal residual disease.  Therefore we decided not to obtain  breast MRIs or scans.     RTC in 1 year      No orders of the defined types were placed in this encounter.  The patient has a good understanding of the overall plan. she agrees with it. she will call with any problems that may develop before the next visit here. Total time spent: 30 mins including face to face time and time spent for planning, charting and co-ordination of care   Harriette Ohara, MD 05/31/22    I Gardiner Coins am acting as a Education administrator for [employer]  I have reviewed the above documentation for accuracy and completeness, and I agree with the above.

## 2022-05-31 NOTE — Assessment & Plan Note (Signed)
03/13/2019:Screening detected left breast abnormalities, 0.6 cm at 8:30 position and 0.9 cm at 8 o'clock position with 1 abnormal left axillary lymph node.  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 .   Breast Cancer Surveillance: U/S: Left breast: Indeterminate mass left breast 0.9 cm, Biopsy 03/07/20: Benign 07/17/20: MRI Brain: Neg 07/23/20: CT CAP: No def evidence of Mets, small sclerotic bone islands 07/24/20: Bone scan: No bone mets   Emotional issues: Patient is doing much better since its been a year since her husband had left her.  She is going through the process of divorce.   Breast cancer surveillance: 1.  Breast exam 05/31/2022 benign  2. breast MRI 01/12/2021: Benign breast density category A We may consider doing breast MRIs every other year.     Patient has been working with a Chief Executive Officer to figure out if her cancer was missed at [place]. RTC in 1 year

## 2022-06-01 ENCOUNTER — Ambulatory Visit: Payer: Self-pay | Admitting: Hematology and Oncology

## 2022-06-01 ENCOUNTER — Other Ambulatory Visit: Payer: BC Managed Care – PPO

## 2022-06-06 ENCOUNTER — Ambulatory Visit: Payer: Self-pay | Admitting: Hematology and Oncology

## 2022-06-06 ENCOUNTER — Other Ambulatory Visit: Payer: BC Managed Care – PPO

## 2022-06-08 ENCOUNTER — Other Ambulatory Visit: Payer: Self-pay

## 2022-06-08 DIAGNOSIS — Z170 Estrogen receptor positive status [ER+]: Secondary | ICD-10-CM

## 2022-06-08 NOTE — Progress Notes (Signed)
Per md orders entered for signatera. Requisition and all supported documents faxed to [phone]. Faxed confirmation was received.  

## 2022-06-15 DIAGNOSIS — C50312 Malignant neoplasm of lower-inner quadrant of left female breast: Secondary | ICD-10-CM | POA: Diagnosis not present

## 2022-06-15 DIAGNOSIS — Z170 Estrogen receptor positive status [ER+]: Secondary | ICD-10-CM | POA: Diagnosis not present

## 2022-06-18 ENCOUNTER — Ambulatory Visit: Payer: BC Managed Care – PPO | Attending: Plastic Surgery

## 2022-06-18 VITALS — Wt 138.4 lb

## 2022-06-18 DIAGNOSIS — Z483 Aftercare following surgery for neoplasm: Secondary | ICD-10-CM | POA: Insufficient documentation

## 2022-06-18 NOTE — Therapy (Signed)
OUTPATIENT PHYSICAL THERAPY SOZO SCREENING NOTE   Patient Name: Alice Davis MRN: [id number] DOB:1971-03-28, 52 y.o., female Today's Date: 06/18/2022  PCP: [name], MD REFERRING PROVIDER: Wallace Going, DO   PT End of Session - 06/18/22 1644     Visit Number 30   # unchanged due to screen only   PT Start Time 1642    PT Stop Time 1646    PT Time Calculation (min) 4 min    Activity Tolerance Patient tolerated treatment well    Behavior During Therapy [facility] for tasks assessed/performed             Past Medical History:  Diagnosis Date   Asthma    Cancer ([place])    Past Surgical History:  Procedure Laterality Date   BREAST RECONSTRUCTION WITH PLACEMENT OF TISSUE EXPANDER AND FLEX HD (ACELLULAR HYDRATED DERMIS) Bilateral 05/14/2019   Procedure: IMMEDIATE BREAST RECONSTRUCTION WITH PLACEMENT OF TISSUE EXPANDER AND FLEX HD (ACELLULAR HYDRATED DERMIS);  Surgeon: Wallace Going, DO;  Location: [place];  Service: Plastics;  Laterality: Bilateral;   NASAL SINUS SURGERY     20 years ago   NIPPLE SPARING MASTECTOMY WITH SENTINEL LYMPH NODE BIOPSY Bilateral 05/14/2019   Procedure: BILATERAL NIPPLE SPARING MASTECTOMIES WITH LEFT SENTINEL LYMPH NODE BIOPSY;  Surgeon: [name], MD;  Location: [place];  Service: General;  Laterality: Bilateral;  PEC   REMOVAL OF BILATERAL TISSUE EXPANDERS WITH PLACEMENT OF BILATERAL BREAST IMPLANTS Bilateral 07/13/2019   Procedure: REMOVAL OF BILATERAL TISSUE EXPANDERS WITH PLACEMENT OF BILATERAL BREAST IMPLANTS;  Surgeon: Wallace Going, DO;  Location: [place];  Service: Plastics;  Laterality: Bilateral;   RHINOPLASTY     20 years   WISDOM TOOTH EXTRACTION     Patient Active Problem List   Diagnosis Date Noted   S/P breast reconstruction, bilateral 07/21/2019   Acquired absence of breast 05/22/2019   Breast cancer ([place]) 05/14/2019   Malignant neoplasm of lower-inner quadrant of left breast in female, estrogen  receptor positive ([place]) 03/24/2019   Allergic dermatitis 12/27/2017   Reactive airway disease without complication [id number]   Allergic rhinitis 11/28/2016   Hyperlipidemia 11/28/2016    REFERRING DIAG: left breast cancer at risk for lymphedema  THERAPY DIAG: Aftercare following surgery for neoplasm  PERTINENT HISTORY: January 7t, 2021 Bil Masectomy with 3 lymph node removal,Left mastectomy: 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   PAIN:  Are you having pain? No  SOZO SCREENING: Patient was assessed today using the SOZO machine to determine the lymphedema index score. This was compared to her baseline score. It was determined that she is within the recommended range when compared to her baseline and no further action is needed at this time. She will continue SOZO screenings. These are done every 3 months for 2 years post operatively followed by every 6 months for 2 years, and then annually.   L-DEX FLOWSHEETS - 06/18/22 1600       L-DEX LYMPHEDEMA SCREENING   Measurement Type Unilateral    L-DEX MEASUREMENT EXTREMITY Upper Extremity    POSITION  Standing    DOMINANT SIDE Right    At Risk Side Left  BASELINE SCORE (UNILATERAL) 0.8    L-DEX SCORE (UNILATERAL) -0.5    VALUE CHANGE (UNILAT) -1.3              Otelia Limes, PTA 06/18/2022, 4:47 PM

## 2022-07-05 ENCOUNTER — Encounter (HOSPITAL_COMMUNITY): Payer: Self-pay

## 2022-08-24 LAB — SIGNATERA ONLY (NATERA MANAGED)
SIGNATERA MTM READOUT: 0 MTM/ml
SIGNATERA TEST RESULT: NEGATIVE

## 2022-08-30 DIAGNOSIS — Z170 Estrogen receptor positive status [ER+]: Secondary | ICD-10-CM | POA: Diagnosis not present

## 2022-08-30 DIAGNOSIS — C50912 Malignant neoplasm of unspecified site of left female breast: Secondary | ICD-10-CM | POA: Diagnosis not present

## 2022-09-07 DIAGNOSIS — C50312 Malignant neoplasm of lower-inner quadrant of left female breast: Secondary | ICD-10-CM | POA: Diagnosis not present

## 2022-09-07 DIAGNOSIS — Z170 Estrogen receptor positive status [ER+]: Secondary | ICD-10-CM | POA: Diagnosis not present

## 2022-09-14 LAB — SIGNATERA
SIGNATERA MTM READOUT: 0 MTM/ml
SIGNATERA TEST RESULT: NEGATIVE

## 2022-09-17 ENCOUNTER — Telehealth: Payer: Self-pay

## 2022-09-17 NOTE — Telephone Encounter (Signed)
Called and LVM with negative Signatera result. Gave call back number with any questions.

## 2022-10-09 ENCOUNTER — Telehealth: Payer: Self-pay

## 2022-10-09 DIAGNOSIS — Z170 Estrogen receptor positive status [ER+]: Secondary | ICD-10-CM

## 2022-10-09 NOTE — Telephone Encounter (Signed)
[id number] Understanding and Predicting Breast Cancer Events after Treatment (UPBEAT)   10/09/22 - Upbeat Study 3 year follow-up phone call. Called and spoke to patient over the phone for her 3 year follow-up visit for the Upbeat study.  She stated she had not been in the hospital for any heart related events.  She wants the questionnaire sent to her e-mail and I confirmed her e-mail address was correct.  I thanked the patient for her continued support in this clinical trial and that she would be called next year for her next follow-up.   Felecia Jan, [facility] 10/09/2022 4:42 PM

## 2022-11-20 ENCOUNTER — Ambulatory Visit: Payer: BC Managed Care – PPO | Admitting: Plastic Surgery

## 2022-11-20 ENCOUNTER — Telehealth: Payer: Self-pay | Admitting: *Deleted

## 2022-11-20 ENCOUNTER — Other Ambulatory Visit: Payer: Self-pay | Admitting: *Deleted

## 2022-11-20 MED ORDER — TAMOXIFEN CITRATE 20 MG PO TABS: ORAL_TABLET | ORAL | 52 refills | Status: DC

## 2022-11-20 NOTE — Telephone Encounter (Signed)
Pt called regarding tamoxifen refill. Informed d/t her insurance prescription must be sent with only a 7 day supply with weekly r/f New prescription sent. Received by pharmacy. Note placed in chart on r/f instructions.

## 2022-11-23 ENCOUNTER — Encounter: Payer: Self-pay | Admitting: Plastic Surgery

## 2022-11-23 ENCOUNTER — Ambulatory Visit: Payer: BC Managed Care – PPO | Admitting: Plastic Surgery

## 2022-11-23 VITALS — BP 123/71 | HR 89 | Resp 96 | Ht 67.0 in | Wt 137.0 lb

## 2022-11-23 DIAGNOSIS — Z923 Personal history of irradiation: Secondary | ICD-10-CM | POA: Diagnosis not present

## 2022-11-23 DIAGNOSIS — Z9889 Other specified postprocedural states: Secondary | ICD-10-CM

## 2022-11-23 DIAGNOSIS — Z853 Personal history of malignant neoplasm of breast: Secondary | ICD-10-CM

## 2022-11-23 DIAGNOSIS — Z9013 Acquired absence of bilateral breasts and nipples: Secondary | ICD-10-CM

## 2022-11-23 NOTE — Progress Notes (Signed)
Patient ID: Alice Davis, female    DOB: [birth year], 52 y.o.   MRN: [id number]   Chief Complaint  Patient presents with   Follow-up   Breast Problem    The patient is here for a one year follow up on her breast reconstruction.  She was diagnosed with invasive mammary carcinoma of the left breast.  She underwent bilateral mastectomies with implant based reconstruction Jan 2021.  The expanders were changed to implants 07/2019 with mentor smooth round high profile xtra gel 415 cc silicone implants. She looks great and is happy with her results.  She has been laying out in the sun.  The left breast looks slightly bruised.  She is not aware of an trauma.  It may be the skin sensitivity to the sun since that side was radiated.  No other concerning areas noted.     Review of Systems  Constitutional: Negative.   HENT: Negative.    Eyes: Negative.   Respiratory: Negative.    Cardiovascular: Negative.   Gastrointestinal: Negative.   Endocrine: Negative.   Genitourinary: Negative.   Neurological: Negative.   Hematological: Negative.   Psychiatric/Behavioral: Negative.      Past Medical History:  Diagnosis Date   Asthma    Cancer [facility])     Past Surgical History:  Procedure Laterality Date   BREAST RECONSTRUCTION WITH PLACEMENT OF TISSUE EXPANDER AND FLEX HD (ACELLULAR HYDRATED DERMIS) Bilateral 05/14/2019   Procedure: IMMEDIATE BREAST RECONSTRUCTION WITH PLACEMENT OF TISSUE EXPANDER AND FLEX HD (ACELLULAR HYDRATED DERMIS);  Surgeon: Peggye Form, DO;  Location: [facility] OR;  Service: Plastics;  Laterality: Bilateral;   NASAL SINUS SURGERY     20 years ago   NIPPLE SPARING MASTECTOMY WITH SENTINEL LYMPH NODE BIOPSY Bilateral 05/14/2019   Procedure: BILATERAL NIPPLE SPARING MASTECTOMIES WITH LEFT SENTINEL LYMPH NODE BIOPSY;  Surgeon: Griselda Miner, MD;  Location: [facility] OR;  Service: General;  Laterality: Bilateral;  PEC   REMOVAL OF BILATERAL TISSUE EXPANDERS WITH PLACEMENT OF BILATERAL  BREAST IMPLANTS Bilateral 07/13/2019   Procedure: REMOVAL OF BILATERAL TISSUE EXPANDERS WITH PLACEMENT OF BILATERAL BREAST IMPLANTS;  Surgeon: Peggye Form, DO;  Location: [facility];  Service: Plastics;  Laterality: Bilateral;   RHINOPLASTY     20 years   WISDOM TOOTH EXTRACTION        Current Outpatient Medications:    albuterol (VENTOLIN HFA) 108 (90 Base) MCG/ACT inhaler, Inhale 2 puffs into the lungs every 4 (four) hours as needed., Disp: , Rfl:    cetirizine (ZYRTEC) 10 MG tablet, Take 10 mg by mouth daily. , Disp: , Rfl:    Cholecalciferol (VITAMIN D) 10 MCG/ML LIQD, Vitamin D, Disp: , Rfl:    cyclobenzaprine (FLEXERIL) 5 MG tablet, Take 1 tablet (5 mg total) by mouth 3 (three) times daily as needed for muscle spasms., Disp: 30 tablet, Rfl: 0   ELDERBERRY PO, Take 15 mLs by mouth daily., Disp: , Rfl:    erythromycin with ethanol (EMGEL) 2 % gel, Apply topically daily., Disp: 30 g, Rfl: 0   fluocinonide (LIDEX) 0.05 % external solution, APPLY TOPICALLY TO THE SCALP EVERY DAY AS NEEDED, Disp: , Rfl:    Multiple Vitamins-Minerals (MULTIVITAMIN ADULT EXTRA C PO), Take 1 tablet by mouth daily. , Disp: , Rfl:    tamoxifen (NOLVADEX) 20 MG tablet, TAKE 1 TABLET(20 MG) BY MOUTH DAILY, Disp: 7 tablet, Rfl: 52   VITAMIN D, CHOLECALCIFEROL, PO, Take 1 tablet by mouth daily., Disp: ,  Rfl:    Objective:   Vitals:   11/23/22 1011  BP: 123/71  Pulse: 89  Resp: (!) 96    Physical Exam Vitals and nursing note reviewed.  Constitutional:      Appearance: Normal appearance.  HENT:     Head: Normocephalic and atraumatic.  Cardiovascular:     Rate and Rhythm: Normal rate.     Pulses: Normal pulses.  Pulmonary:     Effort: Pulmonary effort is normal.  Abdominal:     General: There is no distension.     Palpations: Abdomen is soft.     Tenderness: There is no abdominal tenderness.  Musculoskeletal:        General: No swelling or deformity.  Skin:    General: Skin is  warm.     Capillary Refill: Capillary refill takes less than 2 seconds.  Neurological:     Mental Status: She is alert and oriented to person, place, and time.  Psychiatric:        Mood and Affect: Mood normal.        Behavior: Behavior normal.        Thought Content: Thought content normal.        Judgment: Judgment normal.     Assessment & Plan:  S/P breast reconstruction, bilateral  I would like to see the patient back in one year but sooner if there is any change or worsening of the left breast. Please avoid direct sun exposure to breasts.  Pictures were obtained of the patient and placed in the chart with the patient's or guardian's permission.   Alena Bills Wandell Scullion, DO

## 2022-12-24 ENCOUNTER — Telehealth: Payer: Self-pay

## 2022-12-24 NOTE — Telephone Encounter (Signed)
Pt returned call and was given signatera results per previous encounter.

## 2022-12-24 NOTE — Telephone Encounter (Signed)
Called pt per MD to advise Signatera testing was negative/not detected. LVM for pt to return call to receive results. Signatera will be in touch to schedule 3 mo repeat lab.

## 2022-12-31 ENCOUNTER — Ambulatory Visit: Payer: BC Managed Care – PPO | Attending: Plastic Surgery

## 2022-12-31 VITALS — Wt 144.1 lb

## 2022-12-31 DIAGNOSIS — Z483 Aftercare following surgery for neoplasm: Secondary | ICD-10-CM | POA: Insufficient documentation

## 2022-12-31 NOTE — Therapy (Signed)
OUTPATIENT PHYSICAL THERAPY SOZO SCREENING NOTE   Patient Name: Alice Davis MRN: [id number] DOB:1970-11-18, 52 y.o., female Today's Date: 12/31/2022  PCP: Dois Davenport, MD REFERRING PROVIDER: Peggye Form, DO   PT End of Session - 12/31/22 1646     Visit Number 30   # unchanged due to screen only   PT Start Time 1645    PT Stop Time 1649    PT Time Calculation (min) 4 min    Activity Tolerance Patient tolerated treatment well    Behavior During Therapy WFL for tasks assessed/performed             Past Medical History:  Diagnosis Date   Asthma    Cancer (HCC)    Past Surgical History:  Procedure Laterality Date   BREAST RECONSTRUCTION WITH PLACEMENT OF TISSUE EXPANDER AND FLEX HD (ACELLULAR HYDRATED DERMIS) Bilateral 05/14/2019   Procedure: IMMEDIATE BREAST RECONSTRUCTION WITH PLACEMENT OF TISSUE EXPANDER AND FLEX HD (ACELLULAR HYDRATED DERMIS);  Surgeon: Peggye Form, DO;  Location: [facility];  Service: Plastics;  Laterality: Bilateral;   NASAL SINUS SURGERY     20 years ago   NIPPLE SPARING MASTECTOMY WITH SENTINEL LYMPH NODE BIOPSY Bilateral 05/14/2019   Procedure: BILATERAL NIPPLE SPARING MASTECTOMIES WITH LEFT SENTINEL LYMPH NODE BIOPSY;  Surgeon: Griselda Miner, MD;  Location: [facility];  Service: General;  Laterality: Bilateral;  PEC   REMOVAL OF BILATERAL TISSUE EXPANDERS WITH PLACEMENT OF BILATERAL BREAST IMPLANTS Bilateral 07/13/2019   Procedure: REMOVAL OF BILATERAL TISSUE EXPANDERS WITH PLACEMENT OF BILATERAL BREAST IMPLANTS;  Surgeon: Peggye Form, DO;  Location: [facility];  Service: Plastics;  Laterality: Bilateral;   RHINOPLASTY     20 years   WISDOM TOOTH EXTRACTION     Patient Active Problem List   Diagnosis Date Noted   S/P breast reconstruction, bilateral 07/21/2019   Acquired absence of breast 05/22/2019   Breast cancer (HCC) 05/14/2019   Malignant neoplasm of lower-inner quadrant of left breast in female, estrogen  receptor positive (HCC) 03/24/2019   Allergic dermatitis 12/27/2017   Reactive airway disease without complication 12/13/2016   Allergic rhinitis 11/28/2016   Hyperlipidemia 11/28/2016    REFERRING DIAG: left breast cancer at risk for lymphedema  THERAPY DIAG: Aftercare following surgery for neoplasm  PERTINENT HISTORY: May 14, 2019 Bil Masectomy with 3 lymph node removal,Left mastectomy: Multifocal invasive lobular cancer largest focus 2.5 cm, grade 2, margins negative, 1/3 lymph node positive ER 80-95 %, PR 80-95 %, HER-2 negative, Ki-67 2-20%right mastectomy: Benign; then bil implant exchange July 13, 2019, pt completed radiation 09/25/19, pt currently tamoxifen   PRECAUTIONS: left UE Lymphedema risk, None  SUBJECTIVE: Pt returns for her last 6 month L-Dex screen.   PAIN:  Are you having pain? No  SOZO SCREENING: Patient was assessed today using the SOZO machine to determine the lymphedema index score. This was compared to her baseline score. It was determined that she is within the recommended range when compared to her baseline and no further action is needed at this time. She will continue SOZO screenings. These are done every 3 months for 2 years post operatively followed by every 6 months for 2 years, and then annually.   L-DEX FLOWSHEETS - 12/31/22 1600       L-DEX LYMPHEDEMA SCREENING   Measurement Type Unilateral    L-DEX MEASUREMENT EXTREMITY Upper Extremity    POSITION  Standing    DOMINANT SIDE Right    At Risk Side Left  BASELINE SCORE (UNILATERAL) 0.8    L-DEX SCORE (UNILATERAL) -3    VALUE CHANGE (UNILAT) -3.8            P: Pt will be annually followed with L-Dex screens.   Hermenia Bers, PTA 12/31/2022, 4:48 PM

## 2023-01-11 DIAGNOSIS — H5203 Hypermetropia, bilateral: Secondary | ICD-10-CM | POA: Diagnosis not present

## 2023-03-04 ENCOUNTER — Other Ambulatory Visit (HOSPITAL_BASED_OUTPATIENT_CLINIC_OR_DEPARTMENT_OTHER): Payer: Self-pay | Admitting: Physician Assistant

## 2023-03-04 DIAGNOSIS — Z13220 Encounter for screening for lipoid disorders: Secondary | ICD-10-CM | POA: Diagnosis not present

## 2023-03-04 DIAGNOSIS — Z1329 Encounter for screening for other suspected endocrine disorder: Secondary | ICD-10-CM | POA: Diagnosis not present

## 2023-03-04 DIAGNOSIS — Z1151 Encounter for screening for human papillomavirus (HPV): Secondary | ICD-10-CM | POA: Diagnosis not present

## 2023-03-04 DIAGNOSIS — Z0000 Encounter for general adult medical examination without abnormal findings: Secondary | ICD-10-CM | POA: Diagnosis not present

## 2023-03-04 DIAGNOSIS — N951 Menopausal and female climacteric states: Secondary | ICD-10-CM | POA: Diagnosis not present

## 2023-03-04 DIAGNOSIS — R053 Chronic cough: Secondary | ICD-10-CM

## 2023-03-04 DIAGNOSIS — Z01419 Encounter for gynecological examination (general) (routine) without abnormal findings: Secondary | ICD-10-CM | POA: Diagnosis not present

## 2023-03-04 DIAGNOSIS — Z124 Encounter for screening for malignant neoplasm of cervix: Secondary | ICD-10-CM | POA: Diagnosis not present

## 2023-03-04 DIAGNOSIS — Z6822 Body mass index (BMI) 22.0-22.9, adult: Secondary | ICD-10-CM | POA: Diagnosis not present

## 2023-03-04 DIAGNOSIS — J45909 Unspecified asthma, uncomplicated: Secondary | ICD-10-CM | POA: Diagnosis not present

## 2023-03-05 ENCOUNTER — Encounter (HOSPITAL_BASED_OUTPATIENT_CLINIC_OR_DEPARTMENT_OTHER): Payer: Self-pay

## 2023-03-05 ENCOUNTER — Ambulatory Visit (HOSPITAL_BASED_OUTPATIENT_CLINIC_OR_DEPARTMENT_OTHER)
Admission: RE | Admit: 2023-03-05 | Discharge: 2023-03-05 | Disposition: A | Discharge status: Home / Self Care | Payer: BC Managed Care – PPO | Source: Ambulatory Visit | Attending: Physician Assistant | Admitting: Physician Assistant

## 2023-03-05 DIAGNOSIS — R053 Chronic cough: Secondary | ICD-10-CM | POA: Insufficient documentation

## 2023-03-05 DIAGNOSIS — R059 Cough, unspecified: Secondary | ICD-10-CM | POA: Diagnosis not present

## 2023-03-13 DIAGNOSIS — L719 Rosacea, unspecified: Secondary | ICD-10-CM | POA: Diagnosis not present

## 2023-03-13 DIAGNOSIS — L739 Follicular disorder, unspecified: Secondary | ICD-10-CM | POA: Diagnosis not present

## 2023-03-13 DIAGNOSIS — L700 Acne vulgaris: Secondary | ICD-10-CM | POA: Diagnosis not present

## 2023-03-13 DIAGNOSIS — L309 Dermatitis, unspecified: Secondary | ICD-10-CM | POA: Diagnosis not present

## 2023-03-26 LAB — SIGNATERA
SIGNATERA MTM READOUT: 0 MTM/ml
SIGNATERA TEST RESULT: NEGATIVE

## 2023-03-27 ENCOUNTER — Telehealth: Payer: Self-pay

## 2023-03-27 NOTE — Telephone Encounter (Signed)
Attempted to call pt. Did not get an answer wanted to let pt know that her signatera results was negative.

## 2023-03-27 NOTE — Telephone Encounter (Signed)
Error

## 2023-06-03 ENCOUNTER — Inpatient Hospital Stay: Payer: BC Managed Care – PPO | Attending: Hematology and Oncology | Admitting: Hematology and Oncology

## 2023-06-03 VITALS — BP 107/80 | HR 93 | Temp 98.3°F | Resp 18 | Ht 67.0 in | Wt 142.6 lb

## 2023-06-03 DIAGNOSIS — Z170 Estrogen receptor positive status [ER+]: Secondary | ICD-10-CM | POA: Diagnosis not present

## 2023-06-03 DIAGNOSIS — Z1721 Progesterone receptor positive status: Secondary | ICD-10-CM | POA: Insufficient documentation

## 2023-06-03 DIAGNOSIS — C50312 Malignant neoplasm of lower-inner quadrant of left female breast: Secondary | ICD-10-CM | POA: Diagnosis not present

## 2023-06-03 DIAGNOSIS — Z9013 Acquired absence of bilateral breasts and nipples: Secondary | ICD-10-CM | POA: Insufficient documentation

## 2023-06-03 DIAGNOSIS — Z1732 Human epidermal growth factor receptor 2 negative status: Secondary | ICD-10-CM | POA: Insufficient documentation

## 2023-06-03 MED ORDER — OSTEO BI-FLEX ONE PER DAY PO TABS: 1.0000 | ORAL_TABLET | Freq: Every day | ORAL | Status: AC

## 2023-06-03 MED ORDER — VITAMIN C 1000 MG PO TABS: 1000.0000 mg | ORAL_TABLET | Freq: Every day | ORAL | Status: AC

## 2023-06-03 MED ORDER — PROBIOTIC 1-250 BILLION-MG PO CAPS: 1.0000 | ORAL_CAPSULE | Freq: Every day | ORAL | Status: AC

## 2023-06-03 NOTE — Progress Notes (Signed)
Patient Care Team: Dois Davenport, MD as PCP - General (Family Medicine) Lonie Peak, MD as Attending Physician (Radiation Oncology) Serena Croissant, MD as Consulting Physician (Hematology and Oncology) Griselda Miner, MD as Consulting Physician (General Surgery)  DIAGNOSIS:  Encounter Diagnosis  Name Primary?   Malignant neoplasm of lower-inner quadrant of left breast in female, estrogen receptor positive (HCC) Yes    SUMMARY OF ONCOLOGIC HISTORY: Oncology History  Malignant neoplasm of lower-inner quadrant of left breast in female, estrogen receptor positive (HCC)  03/13/2019 Initial Diagnosis   Screening detected left breast abnormalities, 0.6 cm at 8:30 position and 0.9 cm at 8 o'clock position with 1 abnormal left axillary lymph node.  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%, PR 90%, HER-2 -1+, Ki-67 20%; biopsy 8:30 position: ER 80%, PR 80%, Ki-67 5%, HER-2 negative; lymph node biopsy negative   03/31/2019 Cancer Staging   Staging form: Breast, AJCC 8th Edition - Clinical stage from 03/31/2019: Stage IA (cT1b, cN0, cM0, G2, ER+, PR+, HER2-)   05/14/2019 Surgery   Bilateral mastectomies Carolynne Edouard) [phone]):   Left mastectomy: Multifocal invasive lobular cancer largest focus 2.5 cm, grade 2, margins negative, 1/3 lymph node positive ER 80-95 %, PR 80-95 %, HER-2 negative, Ki-67 2-20%  Right mastectomy: Benign   05/21/2019 Cancer Staging   Staging form: Breast, AJCC 8th Edition - Pathologic stage from 05/21/2019: Stage IB (pT2, pN1a, cM0, G2, ER+, PR+, HER2-)   06/01/2019 Oncotype testing   Mammaprint: low risk   08/12/2019 - 09/25/2019 Radiation Therapy   The patient initially received a dose of 50.4 Gy in 28 fractions to the breast using whole-breast tangent fields. She also received 50.4 Gy in 28 fractions to the left supraclavicular region. This was delivered using a 3-D conformal technique. The pt received a boost delivering an  additional 10 Gy in 5 fractions using a electron boost with electrons. The total dose was 60.4 Gy.   09/2019 - 09/2029 Anti-estrogen oral therapy   Tamoxifen     CHIEF COMPLIANT: Follow-up on tamoxifen therapy  HISTORY OF PRESENT ILLNESS:  History of Present Illness   The patient, Alice Davis, has been on a therapeutic regimen of tamoxifen for approximately three and a half years. She reports having adjusted to the medication, with minor side effects including occasional hot flashes at night and some emotional sensitivity. She also experiences occasional muscle cramps, for which she has been prescribed cyclobenzaprine, although she rarely uses it.  In addition to the tamoxifen, the patient takes a daily dose of 1000mg  of an unspecified over-the-counter medication, as well as a joint supplement called osteobiophlex and a daily probiotic capsule. She also uses Arnicare gel for skin tightness.  The patient reports that she should exercise more, but does not provide further details about her physical activity levels. She works at a [employer], which she commutes to directly from her home. She has not reported any new health concerns or changes in her condition.         ALLERGIES:  is allergic to latex and doxycycline.  MEDICATIONS:  Current Outpatient Medications  Medication Sig Dispense Refill   Ascorbic Acid (VITAMIN C) 1000 MG tablet Take 1 tablet (1,000 mg total) by mouth daily.     Bacillus Coagulans-Inulin (PROBIOTIC) 1-250 BILLION-MG CAPS Take 1 capsule by mouth daily at 12 noon.     Boswellia-Glucosamine-Vit D (OSTEO BI-FLEX ONE PER DAY) TABS Take 1 tablet by mouth daily.  albuterol (VENTOLIN HFA) 108 (90 Base) MCG/ACT inhaler Inhale 2 puffs into the lungs every 4 (four) hours as needed.     cetirizine (ZYRTEC) 10 MG tablet Take 10 mg by mouth daily.      Cholecalciferol (VITAMIN D) 10 MCG/ML LIQD Vitamin D     cyclobenzaprine (FLEXERIL) 5 MG tablet Take 1 tablet (5 mg total)  by mouth 3 (three) times daily as needed for muscle spasms. 30 tablet 0   ELDERBERRY PO Take 15 mLs by mouth daily.     erythromycin with ethanol (EMGEL) 2 % gel Apply topically daily. 30 g 0   fluocinonide (LIDEX) 0.05 % external solution APPLY TOPICALLY TO THE SCALP EVERY DAY AS NEEDED     Multiple Vitamins-Minerals (MULTIVITAMIN ADULT EXTRA C PO) Take 1 tablet by mouth daily.      tamoxifen (NOLVADEX) 20 MG tablet TAKE 1 TABLET(20 MG) BY MOUTH DAILY 7 tablet 52   VITAMIN D, CHOLECALCIFEROL, PO Take 1 tablet by mouth daily.     No current facility-administered medications for this visit.    PHYSICAL EXAMINATION: ECOG PERFORMANCE STATUS: 1 - Symptomatic but completely ambulatory  Vitals:   06/03/23 1529  BP: 107/80  Pulse: 93  Resp: 18  Temp: 98.3 F (36.8 C)  SpO2: 100%   Filed Weights   06/03/23 1529  Weight: 142 lb 9.6 oz (64.7 kg)    Physical Exam   BREAST: Asymmetry with one breast tighter than the other. Overall examination findings normal.      (exam performed in the presence of a chaperone)  LABORATORY DATA:  I have reviewed the data as listed    Latest Ref Rng & Units 05/31/2022    2:16 PM  CMP  Glucose 70 - 99 mg/dL 86   BUN 6 - 20 mg/dL 11   Creatinine 1.61 - 1.00 mg/dL 0.96   Sodium 045 - 409 mmol/L 140   Potassium 3.5 - 5.1 mmol/L 4.1   Chloride 98 - 111 mmol/L 105   CO2 22 - 32 mmol/L 31   Calcium 8.9 - 10.3 mg/dL 9.5   Total Protein 6.5 - 8.1 g/dL 7.3   Total Bilirubin 0.3 - 1.2 mg/dL 0.3   Alkaline Phos 38 - 126 U/L 64   AST 15 - 41 U/L 14   ALT 0 - 44 U/L 10     Lab Results  Component Value Date   WBC 6.1 05/31/2022   HGB 12.8 05/31/2022   HCT 39.1 05/31/2022   MCV 91.8 05/31/2022   PLT 236 05/31/2022   NEUTROABS 3.8 05/31/2022    ASSESSMENT & PLAN:  Malignant neoplasm of lower-inner quadrant of left breast in female, estrogen receptor positive (HCC) 03/13/2019:Screening detected left breast abnormalities, 0.6 cm at 8:30 position and  0.9 cm at 8 o'clock position with 1 abnormal left axillary lymph node.  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%, PR 90%, HER-2 -1+, Ki-67 20%; biopsy 8:30 position: ER 80%, PR 80%, Ki-67 5%, HER-2 negative; lymph node biopsy negative T1 BN 0 stage Ia clinical stage   Bilateral mastectomies: Left mastectomy: Multifocal invasive lobular cancer largest focus 2.5 cm, grade 2, margins negative, 1/3 lymph node positive ER 80-95 %, PR 80-95 %, HER-2 negative, Ki-67 2-20% right mastectomy: Benign T2 N1 a stage Ib   MammaPrint: Low risk Adjuvant radiation: 08/12/2019-09/25/2019   Treatment plan:Adjuvant antiestrogen therapy: We discussed tamoxifen for 10 years versus tamoxifen for 3 years followed by  aromatase inhibitor for 5 years if she is menopausal   Tamoxifen Toxicities: Hot flashes and joint stiffness: Managing it very well. .   Breast Cancer Surveillance: U/S: Left breast: Indeterminate mass left breast 0.9 cm, Biopsy 03/07/20: Benign 07/17/20: MRI Brain: Neg 07/23/20: CT CAP: No def evidence of Mets, small sclerotic bone islands 07/24/20: Bone scan: No bone mets   Emotional issues: Patient is doing much better since its been a year since her husband had left her.  She is definitely happier and appears to be at peace.   Breast cancer surveillance: 1.  Breast exam 06/03/2023 benign  2. breast MRI 01/12/2021: Benign breast density category A 3. Signatera testing for minimal residual disease: Negative    RTC in 1 year   No orders of the defined types were placed in this encounter.  The patient has a good understanding of the overall plan. she agrees with it. she will call with any problems that may develop before the next visit here. Total time spent: 30 mins including face to face time and time spent for planning, charting and co-ordination of care   Tamsen Meek, MD 06/03/23

## 2023-06-03 NOTE — Assessment & Plan Note (Signed)
03/13/2019:Screening detected left breast abnormalities, 0.6 cm at 8:30 position and 0.9 cm at 8 o'clock position with 1 abnormal left axillary lymph node.  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 .   Breast Cancer Surveillance: U/S: Left breast: Indeterminate mass left breast 0.9 cm, Biopsy 03/07/20: Benign 07/17/20: MRI Brain: Neg 07/23/20: CT CAP: No def evidence of Mets, small sclerotic bone islands 07/24/20: Bone scan: No bone mets   Emotional issues: Patient is doing much better since its been a year since her husband had left her.  She is definitely happier and appears to be at peace.   Breast cancer surveillance: 1.  Breast exam 06/03/2023 benign  2. breast MRI 01/12/2021: Benign breast density category A 3. Signatera testing for minimal residual disease: Negative    RTC in 1 year

## 2023-09-02 DIAGNOSIS — C50912 Malignant neoplasm of unspecified site of left female breast: Secondary | ICD-10-CM | POA: Diagnosis not present

## 2023-09-02 DIAGNOSIS — Z170 Estrogen receptor positive status [ER+]: Secondary | ICD-10-CM | POA: Diagnosis not present

## 2023-09-04 ENCOUNTER — Other Ambulatory Visit: Payer: Self-pay | Admitting: *Deleted

## 2023-09-04 DIAGNOSIS — C50312 Malignant neoplasm of lower-inner quadrant of left female breast: Secondary | ICD-10-CM

## 2023-09-04 NOTE — Progress Notes (Signed)
 Signatera renewal orders placed.

## 2023-09-06 ENCOUNTER — Other Ambulatory Visit: Payer: Self-pay | Admitting: Hematology and Oncology

## 2023-09-09 DIAGNOSIS — C50312 Malignant neoplasm of lower-inner quadrant of left female breast: Secondary | ICD-10-CM | POA: Diagnosis not present

## 2023-09-09 DIAGNOSIS — Z170 Estrogen receptor positive status [ER+]: Secondary | ICD-10-CM | POA: Diagnosis not present

## 2023-09-18 LAB — SIGNATERA ONLY (NATERA MANAGED)
SIGNATERA MTM READOUT: 0 MTM/ml
SIGNATERA TEST RESULT: NEGATIVE

## 2023-09-19 ENCOUNTER — Telehealth: Payer: Self-pay | Admitting: *Deleted

## 2023-09-19 NOTE — Telephone Encounter (Signed)
Per MD request, RN placed call to pt regarding negative (Not Detected) recent Signatera testing.  No answer, LVM for pt to return call to the office.  

## 2023-09-20 ENCOUNTER — Telehealth: Payer: Self-pay

## 2023-09-20 NOTE — Telephone Encounter (Signed)
 [id number] [id number] Understanding and Predicting Breast Cancer Events after Treatment (UPBEAT)  Study Follow-up - Year 4   Ms. Alice Davis was contacted by phone regarding the Year 4 follow-up for the above study. Patient reported doing well and denied any hospitalizations since the last follow-up call on 10/09/2022. She also denied experiencing any cardiac events, including myocardial infarction (MI), percutaneous coronary intervention (PCI), coronary artery bypass grafting (CABG), heart catheterization, cerebrovascular accident (CVA), or a diagnosis of heart failure since the last contact.   She completed the KCCQ-12 questionnaire over the phone. Ms. Escoffery confirmed her preference to complete future questionnaires via phone call.   Patient was informed that the next follow-up call will be in approximately one year. She stated she has no questions at this time and is aware that she may contact the research team with any study-related concerns. She was thanked for her time and her continued participation in the study.  Larsen Dungan, Ph.D. Clinical Research Coordinator [phone] 09/20/2023 11:58 AM

## 2023-09-24 DIAGNOSIS — N950 Postmenopausal bleeding: Secondary | ICD-10-CM | POA: Diagnosis not present

## 2023-11-05 DIAGNOSIS — N950 Postmenopausal bleeding: Secondary | ICD-10-CM | POA: Diagnosis not present

## 2023-11-14 ENCOUNTER — Other Ambulatory Visit: Payer: Self-pay

## 2023-11-14 MED ORDER — TAMOXIFEN CITRATE 20 MG PO TABS: ORAL_TABLET | ORAL | 52 refills | Status: AC

## 2023-11-14 NOTE — Telephone Encounter (Signed)
 Pt called to request med refill on tamoxifen  20 mg 7 day supply with 52 refills. Pt states her insurance requires this cadence of refills.  Refill sent to pt preferred phx per MD. She verbalized thanks and understanding.

## 2023-11-22 ENCOUNTER — Encounter: Payer: Self-pay | Admitting: Plastic Surgery

## 2023-11-22 ENCOUNTER — Ambulatory Visit: Payer: BC Managed Care – PPO | Admitting: Plastic Surgery

## 2023-11-22 VITALS — BP 111/70 | HR 90 | Ht 67.0 in | Wt 133.4 lb

## 2023-11-22 DIAGNOSIS — C50312 Malignant neoplasm of lower-inner quadrant of left female breast: Secondary | ICD-10-CM

## 2023-11-22 DIAGNOSIS — Z170 Estrogen receptor positive status [ER+]: Secondary | ICD-10-CM | POA: Diagnosis not present

## 2023-11-22 DIAGNOSIS — Z9013 Acquired absence of bilateral breasts and nipples: Secondary | ICD-10-CM | POA: Diagnosis not present

## 2023-11-25 NOTE — Progress Notes (Signed)
 Patient ID: Alice Davis, female    DOB: Sep 08, 1970, 53 y.o.   MRN: [id number]   Chief Complaint  Patient presents with   Follow-up    The patient is a 53 yrs old female here with her son at the beginning for a one year follow up.  She had invasive mammary carcinoma of the left breast in 2021.  She underwent bilateral mastectomies with mentor smooth round high profile xtra gel 415 cc silicone implants (07/2019).  She is doing very well.  She looks great and is very pleased with her results.  She might be interested in a minor adjustments in the near future for improved symmetry.  No areas of concern noted and the breasts are soft.    Review of Systems  Constitutional: Negative.   HENT: Negative.    Eyes: Negative.   Respiratory: Negative.    Gastrointestinal: Negative.   Endocrine: Negative.   Genitourinary: Negative.   Musculoskeletal: Negative.   Skin: Negative.   Psychiatric/Behavioral: Negative.      Past Medical History:  Diagnosis Date   Asthma    Cancer [facility])     Past Surgical History:  Procedure Laterality Date   BREAST RECONSTRUCTION WITH PLACEMENT OF TISSUE EXPANDER AND FLEX HD (ACELLULAR HYDRATED DERMIS) Bilateral 05/14/2019   Procedure: IMMEDIATE BREAST RECONSTRUCTION WITH PLACEMENT OF TISSUE EXPANDER AND FLEX HD (ACELLULAR HYDRATED DERMIS);  Surgeon: Lowery Estefana RAMAN, DO;  Location: [facility] OR;  Service: Plastics;  Laterality: Bilateral;   NASAL SINUS SURGERY     20 years ago   NIPPLE SPARING MASTECTOMY WITH SENTINEL LYMPH NODE BIOPSY Bilateral 05/14/2019   Procedure: BILATERAL NIPPLE SPARING MASTECTOMIES WITH LEFT SENTINEL LYMPH NODE BIOPSY;  Surgeon: Curvin Deward MOULD, MD;  Location: [facility] OR;  Service: General;  Laterality: Bilateral;  PEC   REMOVAL OF BILATERAL TISSUE EXPANDERS WITH PLACEMENT OF BILATERAL BREAST IMPLANTS Bilateral 07/13/2019   Procedure: REMOVAL OF BILATERAL TISSUE EXPANDERS WITH PLACEMENT OF BILATERAL BREAST IMPLANTS;  Surgeon: Lowery Estefana RAMAN,  DO;  Location: [facility];  Service: Plastics;  Laterality: Bilateral;   RHINOPLASTY     20 years   WISDOM TOOTH EXTRACTION        Current Outpatient Medications:    albuterol  (VENTOLIN  HFA) 108 (90 Base) MCG/ACT inhaler, Inhale 2 puffs into the lungs every 4 (four) hours as needed., Disp: , Rfl:    Ascorbic Acid (VITAMIN C ) 1000 MG tablet, Take 1 tablet (1,000 mg total) by mouth daily., Disp: , Rfl:    Bacillus Coagulans-Inulin (PROBIOTIC) 1-250 BILLION-MG CAPS, Take 1 capsule by mouth daily at 12 noon., Disp: , Rfl:    Boswellia-Glucosamine-Vit D (OSTEO BI-FLEX ONE PER DAY) TABS, Take 1 tablet by mouth daily., Disp: , Rfl:    cetirizine (ZYRTEC) 10 MG tablet, Take 10 mg by mouth daily. , Disp: , Rfl:    Cholecalciferol (VITAMIN D) 10 MCG/ML LIQD, Vitamin D, Disp: , Rfl:    cyclobenzaprine  (FLEXERIL ) 5 MG tablet, TAKE 1 TABLET(5 MG) BY MOUTH THREE TIMES DAILY AS NEEDED FOR MUSCLE SPASMS, Disp: 30 tablet, Rfl: 0   ELDERBERRY PO, Take 15 mLs by mouth daily., Disp: , Rfl:    erythromycin  with ethanol (EMGEL ) 2 % gel, Apply topically daily., Disp: 30 g, Rfl: 0   fluocinonide (LIDEX) 0.05 % external solution, APPLY TOPICALLY TO THE SCALP EVERY DAY AS NEEDED, Disp: , Rfl:    Multiple Vitamins-Minerals (MULTIVITAMIN ADULT EXTRA C PO), Take 1 tablet by mouth daily. , Disp: ,  Rfl:    tamoxifen  (NOLVADEX ) 20 MG tablet, TAKE 1 TABLET(20 MG) BY MOUTH DAILY, Disp: 7 tablet, Rfl: 52   VITAMIN D, CHOLECALCIFEROL, PO, Take 1 tablet by mouth daily., Disp: , Rfl:    Objective:   Vitals:   11/22/23 1401  BP: 111/70  Pulse: 90  SpO2: 97%    Physical Exam Vitals reviewed.  Constitutional:      Appearance: Normal appearance.  HENT:     Head: Atraumatic.  Cardiovascular:     Rate and Rhythm: Normal rate.  Pulmonary:     Effort: Pulmonary effort is normal.  Abdominal:     Palpations: Abdomen is soft.  Musculoskeletal:        General: No swelling or deformity.  Skin:    General:  Skin is warm.     Capillary Refill: Capillary refill takes less than 2 seconds.  Neurological:     General: No focal deficit present.     Mental Status: She is alert.  Psychiatric:        Mood and Affect: Mood normal.        Behavior: Behavior normal.        Thought Content: Thought content normal.        Judgment: Judgment normal.     Assessment & Plan:  Malignant neoplasm of lower-inner quadrant of left breast in female, estrogen receptor positive (HCC)  Acquired absence of both breasts  Follow up in one year.  No imagine needed at this time.  Plan for follow up at the beginning of the year for possible minor adjustments and then keep one year follow up per patient request as it is well scheduled with her other follow up appointments.  Pictures were obtained of the patient and placed in the chart with the patient's or guardian's permission.   Estefana RAMAN Taha Dimond, DO

## 2023-12-06 ENCOUNTER — Telehealth: Payer: Self-pay | Admitting: Plastic Surgery

## 2023-12-06 NOTE — Telephone Encounter (Signed)
 Patient would like to discuss poss surgery dates, please reach out, she said she spoke to you at her last appt

## 2023-12-20 ENCOUNTER — Telehealth: Payer: Self-pay | Admitting: Plastic Surgery

## 2023-12-20 ENCOUNTER — Other Ambulatory Visit: Payer: Self-pay | Admitting: Plastic Surgery

## 2023-12-20 DIAGNOSIS — Z9013 Acquired absence of bilateral breasts and nipples: Secondary | ICD-10-CM

## 2023-12-20 NOTE — Telephone Encounter (Signed)
 Patient is calling back about a tele message that I had sent on 8-1 stating Patient would like to discuss poss surgery dates, please reach out, she said she spoke to you at her last appt please advise or reach out?

## 2023-12-30 ENCOUNTER — Ambulatory Visit: Payer: BC Managed Care – PPO | Attending: Plastic Surgery

## 2023-12-30 VITALS — Wt 133.0 lb

## 2023-12-30 DIAGNOSIS — Z483 Aftercare following surgery for neoplasm: Secondary | ICD-10-CM | POA: Insufficient documentation

## 2023-12-30 NOTE — Therapy (Signed)
 OUTPATIENT PHYSICAL THERAPY SOZO SCREENING NOTE   Patient Name: Alice Davis MRN: [id number] DOB:[birth year], 53 y.o., female Today's Date: 12/30/2023  PCP: Burney Darice CROME, MD REFERRING PROVIDER: Lowery Estefana RAMAN, DO   PT End of Session - 12/30/23 1656     Visit Number 30   # unchanged due to screen only   PT Start Time 1655    PT Stop Time 1659    PT Time Calculation (min) 4 min    Activity Tolerance Patient tolerated treatment well    Behavior During Therapy WFL for tasks assessed/performed          Past Medical History:  Diagnosis Date   Asthma    Cancer (HCC)    Past Surgical History:  Procedure Laterality Date   BREAST RECONSTRUCTION WITH PLACEMENT OF TISSUE EXPANDER AND FLEX HD (ACELLULAR HYDRATED DERMIS) Bilateral 05/14/2019   Procedure: IMMEDIATE BREAST RECONSTRUCTION WITH PLACEMENT OF TISSUE EXPANDER AND FLEX HD (ACELLULAR HYDRATED DERMIS);  Surgeon: Lowery Estefana RAMAN, DO;  Location: [facility];  Service: Plastics;  Laterality: Bilateral;   NASAL SINUS SURGERY     20 years ago   NIPPLE SPARING MASTECTOMY WITH SENTINEL LYMPH NODE BIOPSY Bilateral 05/14/2019   Procedure: BILATERAL NIPPLE SPARING MASTECTOMIES WITH LEFT SENTINEL LYMPH NODE BIOPSY;  Surgeon: Curvin Deward MOULD, MD;  Location: [facility] OR;  Service: General;  Laterality: Bilateral;  PEC   REMOVAL OF BILATERAL TISSUE EXPANDERS WITH PLACEMENT OF BILATERAL BREAST IMPLANTS Bilateral 07/13/2019   Procedure: REMOVAL OF BILATERAL TISSUE EXPANDERS WITH PLACEMENT OF BILATERAL BREAST IMPLANTS;  Surgeon: Lowery Estefana RAMAN, DO;  Location: [facility];  Service: Plastics;  Laterality: Bilateral;   RHINOPLASTY     20 years   WISDOM TOOTH EXTRACTION     Patient Active Problem List   Diagnosis Date Noted   S/P breast reconstruction, bilateral 07/21/2019   Acquired absence of breast 05/22/2019   Breast cancer (HCC) 05/14/2019   Malignant neoplasm of lower-inner quadrant of left breast in female, estrogen  receptor positive (HCC) 03/24/2019   Allergic dermatitis 12/27/2017   Reactive airway disease without complication 12/13/2016   Allergic rhinitis 11/28/2016   Hyperlipidemia 11/28/2016    REFERRING DIAG: left breast cancer at risk for lymphedema  THERAPY DIAG: Aftercare following surgery for neoplasm  PERTINENT HISTORY: May 14, 2019 Bil Masectomy with 3 lymph node removal,Left mastectomy: 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   PAIN:  Are you having pain? No  SOZO SCREENING: Patient was assessed today using the SOZO machine to determine the lymphedema index score. This was compared to her baseline score. It was determined that she is within the recommended range when compared to her baseline and no further action is needed at this time. She will continue SOZO screenings. These are done every 3 months for 2 years post operatively followed by every 6 months for 2 years, and then annually.   L-DEX FLOWSHEETS - 12/30/23 1600       L-DEX LYMPHEDEMA SCREENING   Measurement Type Unilateral    L-DEX MEASUREMENT EXTREMITY Upper Extremity    POSITION  Standing    DOMINANT SIDE Right    At Risk Side Left    BASELINE  SCORE (UNILATERAL) 0.8    L-DEX SCORE (UNILATERAL) 0.3    VALUE CHANGE (UNILAT) -0.5         P: Pt will be annually followed with L-Dex screens.   Aden Berwyn Caldron, PTA 12/30/2023, 4:59 PM

## 2024-03-03 DIAGNOSIS — Z1329 Encounter for screening for other suspected endocrine disorder: Secondary | ICD-10-CM | POA: Diagnosis not present

## 2024-03-03 DIAGNOSIS — Z0000 Encounter for general adult medical examination without abnormal findings: Secondary | ICD-10-CM | POA: Diagnosis not present

## 2024-03-03 DIAGNOSIS — Z13220 Encounter for screening for lipoid disorders: Secondary | ICD-10-CM | POA: Diagnosis not present

## 2024-03-04 DIAGNOSIS — Z6821 Body mass index (BMI) 21.0-21.9, adult: Secondary | ICD-10-CM | POA: Diagnosis not present

## 2024-03-04 DIAGNOSIS — Z124 Encounter for screening for malignant neoplasm of cervix: Secondary | ICD-10-CM | POA: Diagnosis not present

## 2024-03-04 DIAGNOSIS — Z01419 Encounter for gynecological examination (general) (routine) without abnormal findings: Secondary | ICD-10-CM | POA: Diagnosis not present

## 2024-03-04 DIAGNOSIS — Z1151 Encounter for screening for human papillomavirus (HPV): Secondary | ICD-10-CM | POA: Diagnosis not present

## 2024-03-05 ENCOUNTER — Other Ambulatory Visit: Payer: Self-pay | Admitting: Obstetrics and Gynecology

## 2024-03-05 DIAGNOSIS — Z853 Personal history of malignant neoplasm of breast: Secondary | ICD-10-CM

## 2024-03-11 DIAGNOSIS — C50312 Malignant neoplasm of lower-inner quadrant of left female breast: Secondary | ICD-10-CM | POA: Diagnosis not present

## 2024-03-11 DIAGNOSIS — Z170 Estrogen receptor positive status [ER+]: Secondary | ICD-10-CM | POA: Diagnosis not present

## 2024-03-18 LAB — SIGNATERA
SIGNATERA MTM READOUT: 0 MTM/ml
SIGNATERA TEST RESULT: NEGATIVE

## 2024-03-25 DIAGNOSIS — L719 Rosacea, unspecified: Secondary | ICD-10-CM | POA: Diagnosis not present

## 2024-03-25 DIAGNOSIS — L578 Other skin changes due to chronic exposure to nonionizing radiation: Secondary | ICD-10-CM | POA: Diagnosis not present

## 2024-03-25 DIAGNOSIS — L700 Acne vulgaris: Secondary | ICD-10-CM | POA: Diagnosis not present

## 2024-03-25 DIAGNOSIS — L309 Dermatitis, unspecified: Secondary | ICD-10-CM | POA: Diagnosis not present

## 2024-04-08 ENCOUNTER — Telehealth: Payer: Self-pay | Admitting: Hematology and Oncology

## 2024-04-08 NOTE — Telephone Encounter (Signed)
 left vm for pt about rescheduled appt time and date. Encouraged to call back if need to change

## 2024-05-08 ENCOUNTER — Ambulatory Visit: Admitting: Student

## 2024-05-08 ENCOUNTER — Telehealth: Payer: Self-pay | Admitting: Plastic Surgery

## 2024-05-08 ENCOUNTER — Ambulatory Visit: Admitting: Plastic Surgery

## 2024-05-08 ENCOUNTER — Encounter: Payer: Self-pay | Admitting: Student

## 2024-05-08 VITALS — BP 119/76 | HR 82 | Ht 67.0 in | Wt 135.0 lb

## 2024-05-08 DIAGNOSIS — Z170 Estrogen receptor positive status [ER+]: Secondary | ICD-10-CM

## 2024-05-08 DIAGNOSIS — Z853 Personal history of malignant neoplasm of breast: Secondary | ICD-10-CM | POA: Diagnosis not present

## 2024-05-08 DIAGNOSIS — N651 Disproportion of reconstructed breast: Secondary | ICD-10-CM

## 2024-05-08 DIAGNOSIS — Z9013 Acquired absence of bilateral breasts and nipples: Secondary | ICD-10-CM | POA: Diagnosis not present

## 2024-05-08 MED ORDER — ONDANSETRON HCL 4 MG PO TABS: 4.0000 mg | ORAL_TABLET | Freq: Three times a day (TID) | ORAL | 0 refills | Status: AC | PRN

## 2024-05-08 MED ORDER — CEPHALEXIN 500 MG PO CAPS: 500.0000 mg | ORAL_CAPSULE | Freq: Four times a day (QID) | ORAL | 0 refills | Status: AC

## 2024-05-08 MED ORDER — TRAMADOL HCL 50 MG PO TABS: 50.0000 mg | ORAL_TABLET | Freq: Three times a day (TID) | ORAL | 0 refills | Status: AC | PRN

## 2024-05-08 NOTE — Telephone Encounter (Signed)
 Patient called to follow up and said that Estefana Peck was going to contact Dr Odean to see if patient needs to stop tamoxifen  (NOLVADEX ) 20 MG tablet, Patient stated that if she does need to stop this medication then she will probably have to cancel the surgery for now because she said it was so hard for her when she went on it that she doesn't want to go through that again. She said just let her know what the provider says

## 2024-05-08 NOTE — Progress Notes (Signed)
 "    Patient ID: Alice Davis, female    DOB: Sep 11, 1970, 54 y.o.   MRN: [id number]  Chief Complaint  Patient presents with   Pre-op Exam      ICD-10-CM   1. Malignant neoplasm of lower-inner quadrant of left breast in female, estrogen receptor positive (HCC)  C50.312    Z17.0        History of Present Illness: Alice Davis is a 54 y.o.  female  with a history of breast cancer status post breast reconstruction.  She presents for preoperative evaluation for upcoming procedure, bilateral fat grafting to the breasts with possible repositioning of the right breast implant, scheduled for 05/28/2024 with Dr. Lowery.  The patient has not had problems with anesthesia.  Patient denies any history of cardiac disease.  She denies taking any blood thinners.  Patient reports she is not a smoker.  Patient reports that she currently takes tamoxifen .  She denies taking any birth control.  Patient denies history of greater than 3 miscarriages.  She denies any personal history of blood clots.  She did reports that her son passed away of a PE.  She denies any personal history of clotting diseases.  She does report her uncle has factor V Leyden.  She states that she has been tested for it in the past and is negative.  Patient denies any recent surgeries, traumas or infections.  She denies any history of stroke or heart attack.  She denies any history of Crohn's disease or ulcerative colitis.  She denies any history of COPD.  She does report she has history of asthma which is well-controlled.  Patient denies any varicosities to her lower extremities.  She denies any recent fevers, chills or changes in her health.  Summary of Previous Visit: Patient was last seen in the clinic by Dr. Lowery on 11/22/2023.  At this visit, it was noted that patient had invasive mammary carcinoma of the left breast in 2021 and underwent bilateral mastectomies and breast reconstruction.  She had Mentor smooth round  high-profile extra gel 14 cc silicone implants placed in March 2021.  Patient was doing well and was very pleased with her results.  She was interested in minor adjustments in the near future for improved symmetry.  Plan was for patient to follow-up in 1 year for possible minor adjustments.  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  She understands she will have restrictions for 6 weeks postoperatively.  PMH Significant for: Allergic rhinitis, history of breast cancer status post breast reconstruction  Patient states today that she does not want fat grafting to either of her breasts and that she just wants the right breast implant repositioned to better match the other side.   Past Medical History: Allergies: Allergies[1]  Current Medications: Current Medications[2]  Past Medical Problems: Past Medical History:  Diagnosis Date   Asthma    Cancer (HCC)     Past Surgical History: Past Surgical History:  Procedure Laterality Date   BREAST RECONSTRUCTION WITH PLACEMENT OF TISSUE EXPANDER AND FLEX HD (ACELLULAR HYDRATED DERMIS) Bilateral 05/14/2019   Procedure: IMMEDIATE BREAST RECONSTRUCTION WITH PLACEMENT OF TISSUE EXPANDER AND FLEX HD (ACELLULAR HYDRATED DERMIS);  Surgeon: Lowery Estefana RAMAN, DO;  Location: [facility];  Service: Plastics;  Laterality: Bilateral;   NASAL SINUS SURGERY     20 years ago   NIPPLE SPARING MASTECTOMY WITH SENTINEL LYMPH NODE BIOPSY Bilateral 05/14/2019   Procedure: BILATERAL NIPPLE  SPARING MASTECTOMIES WITH LEFT SENTINEL LYMPH NODE BIOPSY;  Surgeon: Curvin Deward MOULD, MD;  Location: [facility];  Service: General;  Laterality: Bilateral;  PEC   REMOVAL OF BILATERAL TISSUE EXPANDERS WITH PLACEMENT OF BILATERAL BREAST IMPLANTS Bilateral 07/13/2019   Procedure: REMOVAL OF BILATERAL TISSUE EXPANDERS WITH PLACEMENT OF BILATERAL BREAST IMPLANTS;  Surgeon: Lowery Estefana RAMAN, DO;  Location: [facility];  Service: Plastics;  Laterality:  Bilateral;   RHINOPLASTY     20 years   WISDOM TOOTH EXTRACTION      Social History: Social History   Socioeconomic History   Marital status: Divorced    Spouse name: Not on file   Number of children: Not on file   Years of education: Not on file   Highest education level: Not on file  Occupational History   Not on file  Tobacco Use   Smoking status: Never   Smokeless tobacco: Never  Vaping Use   Vaping status: Never Used  Substance and Sexual Activity   Alcohol use: Not Currently    Comment: Twice a month   Drug use: Never   Sexual activity: Yes  Other Topics Concern   Not on file  Social History Narrative   Not on file   Social Drivers of Health   Tobacco Use: Low Risk (05/08/2024)   Patient History    Smoking Tobacco Use: Never    Smokeless Tobacco Use: Never    Passive Exposure: Not on file  Financial Resource Strain: Not on file  Food Insecurity: Not on file  Transportation Needs: Not on file  Physical Activity: Not on file  Stress: Not on file  Social Connections: Unknown (09/18/2021)   Received from [facility]   Social Network    Social Network: Not on file  Intimate Partner Violence: Unknown (08/10/2021)   Received from [facility]   HITS    Physically Hurt: Not on file    Insult or Talk Down To: Not on file    Threaten Physical Harm: Not on file    Scream or Curse: Not on file  Depression (EYV7-0): Not on file  Alcohol Screen: Not on file  Housing: Unknown (09/02/2023)   Received from [facility]   Epic    Unable to Pay for Housing in the Last Year: Not on file    Number of Times Moved in the Last Year: Not on file    At any time in the past 12 months, were you homeless or living in a shelter (including now)?: No  Utilities: Not on file  Health Literacy: Not on file    Family History: Family History  Problem Relation Age of Onset   Breast cancer Mother    Diabetes Father    Diabetes Paternal Grandmother     Review of  Systems: Denies any recent fevers, chills or changes in her health  Physical Exam: Vital Signs BP 119/76   Pulse 82   Ht 5' 7 (1.702 m)   Wt 135 lb (61.2 kg)   SpO2 97%   BMI 21.14 kg/m   Physical Exam: Chaperone present on exam Constitutional:      General: Not in acute distress.    Appearance: Normal appearance. Not ill-appearing.  HENT:     Head: Normocephalic and atraumatic.  Eyes:     Pupils: Pupils are equal, round Neck:     Musculoskeletal: Normal range of motion.  Cardiovascular:     Rate and Rhythm: Normal rate Pulmonary:  Effort: Pulmonary effort is normal. No respiratory distress.  Breasts: Right breast sits slightly lower than the left breast, NAC's appear to be healthy.   Musculoskeletal: Normal range of motion.  Skin:    General: Skin is warm and dry.     Findings: No erythema or rash.  Neurological:     Mental Status: Alert and oriented to person, place, and time. Mental status is at baseline.  Psychiatric:        Mood and Affect: Mood normal.        Behavior: Behavior normal.    Assessment/Plan: The patient is scheduled for repositioning of the R breast implant with Dr. Lowery.  Risks, benefits, and alternatives of procedure discussed, questions answered and consent obtained.    Dr. Lowery had the opportunity to evaluate and examine the patient today.  The patient expressed a Dr. Lowery that she did not want fat grafting.  Together the plan was made that patient would just have repositioning of the right breast implant for improved symmetry.  Patient was in agreement with this plan.  Smoking Status: Non-smoker; Counseling Given?  N/A Last Mammogram: Status post bilateral mastectomies  Caprini Score: 8; Risk Factors include: Age, family history of thrombosis, history of malignancy, and length of planned surgery. Recommendation for mechanical and possible pharmacological prophylaxis. Encourage early ambulation.  Will discuss the possibility  of postoperative Lovenox with Dr. Lowery.  Pictures obtained: 11/22/2023  Post-op Rx sent to pharmacy: Oxycodone , Zofran , Keflex   Instructed the patient to hold her multivitamins/vitamins and supplements at least 1 week prior to surgery. Patient reports she is nervous to stop her tamoxifen  before/after surgery as she had a difficult time getting used to the medication. Discussed with her that tamoxifen  can increase her risk of blood clots especially in the perioperative setting.  Discussed with her that I will reach out to Dr. Gudena to see if she can hold this 1 to 2 weeks prior to surgery in 1 to 2 weeks after.  Patient was provided with the General Surgical Risk consent document and Pain Medication Agreement prior to their appointment.  They had adequate time to read through the risk consent documents and Pain Medication Agreement. We also discussed them in person together during this preop appointment. All of their questions were answered to their satisfaction.  Recommended calling if they have any further questions.  Risk consent form and Pain Medication Agreement to be scanned into patient's chart.  The consent was obtained with risks and complications reviewed which included bleeding, pain, scar, infection and the risk of anesthesia.  The patients questions were answered to the patients expressed satisfaction.    Electronically signed by: Estefana FORBES Peck, PA-C 05/08/2024 1:53 PM      [1]  Allergies Allergen Reactions   Latex Itching and Rash   Doxycycline    [2]  Current Outpatient Medications:    albuterol (VENTOLIN HFA) 108 (90 Base) MCG/ACT inhaler, Inhale 2 puffs into the lungs every 4 (four) hours as needed., Disp: , Rfl:    Ascorbic Acid (VITAMIN C ) 1000 MG tablet, Take 1 tablet (1,000 mg total) by mouth daily., Disp: , Rfl:    Bacillus Coagulans-Inulin (PROBIOTIC) 1-250 BILLION-MG CAPS, Take 1 capsule by mouth daily at 12 noon., Disp: , Rfl:    Boswellia-Glucosamine-Vit D  (OSTEO BI-FLEX ONE PER DAY) TABS, Take 1 tablet by mouth daily., Disp: , Rfl:    cetirizine (ZYRTEC) 10 MG tablet, Take 10 mg by mouth daily. , Disp: , Rfl:  Cholecalciferol (VITAMIN D) 10 MCG/ML LIQD, Vitamin D, Disp: , Rfl:    cyclobenzaprine  (FLEXERIL ) 5 MG tablet, TAKE 1 TABLET(5 MG) BY MOUTH THREE TIMES DAILY AS NEEDED FOR MUSCLE SPASMS, Disp: 30 tablet, Rfl: 0   ELDERBERRY PO, Take 15 mLs by mouth daily., Disp: , Rfl:    erythromycin  with ethanol (EMGEL ) 2 % gel, Apply topically daily., Disp: 30 g, Rfl: 0   Multiple Vitamins-Minerals (MULTIVITAMIN ADULT EXTRA C PO), Take 1 tablet by mouth daily. , Disp: , Rfl:    tamoxifen  (NOLVADEX ) 20 MG tablet, TAKE 1 TABLET(20 MG) BY MOUTH DAILY, Disp: 7 tablet, Rfl: 52   VITAMIN D, CHOLECALCIFEROL, PO, Take 1 tablet by mouth daily., Disp: , Rfl:   "

## 2024-05-28 ENCOUNTER — Encounter (HOSPITAL_BASED_OUTPATIENT_CLINIC_OR_DEPARTMENT_OTHER): Payer: Self-pay

## 2024-05-28 ENCOUNTER — Ambulatory Visit (HOSPITAL_BASED_OUTPATIENT_CLINIC_OR_DEPARTMENT_OTHER): Admit: 2024-05-28 | Admitting: Plastic Surgery

## 2024-05-28 SURGERY — LIPOSUCTION, WITH FAT TRANSFER
Anesthesia: Choice | Site: Breast | Laterality: Right

## 2024-06-03 ENCOUNTER — Ambulatory Visit: Payer: BC Managed Care – PPO | Admitting: Hematology and Oncology

## 2024-06-04 ENCOUNTER — Inpatient Hospital Stay: Attending: Hematology and Oncology | Admitting: Hematology and Oncology

## 2024-06-04 VITALS — BP 123/59 | HR 93 | Temp 97.8°F | Resp 16 | Ht 67.0 in | Wt 136.3 lb

## 2024-06-04 DIAGNOSIS — Z9013 Acquired absence of bilateral breasts and nipples: Secondary | ICD-10-CM | POA: Diagnosis not present

## 2024-06-04 DIAGNOSIS — Z1732 Human epidermal growth factor receptor 2 negative status: Secondary | ICD-10-CM | POA: Insufficient documentation

## 2024-06-04 DIAGNOSIS — Z923 Personal history of irradiation: Secondary | ICD-10-CM | POA: Insufficient documentation

## 2024-06-04 DIAGNOSIS — Z170 Estrogen receptor positive status [ER+]: Secondary | ICD-10-CM | POA: Diagnosis present

## 2024-06-04 DIAGNOSIS — Z79810 Long term (current) use of selective estrogen receptor modulators (SERMs): Secondary | ICD-10-CM | POA: Diagnosis not present

## 2024-06-04 DIAGNOSIS — Z1721 Progesterone receptor positive status: Secondary | ICD-10-CM | POA: Insufficient documentation

## 2024-06-04 DIAGNOSIS — C50312 Malignant neoplasm of lower-inner quadrant of left female breast: Secondary | ICD-10-CM | POA: Insufficient documentation

## 2024-06-04 MED ORDER — CYCLOBENZAPRINE HCL 5 MG PO TABS: 5.0000 mg | ORAL_TABLET | Freq: Three times a day (TID) | ORAL | 0 refills | Status: AC | PRN

## 2024-06-04 NOTE — Progress Notes (Signed)
 "  Patient Care Team: Alice Darice CROME, MD as PCP - General (Family Medicine) Alice Domino, MD as Attending Physician (Radiation Oncology) Alice Potts, MD as Consulting Physician (Hematology and Oncology) Alice Deward MOULD, MD as Consulting Physician (General Surgery)  DIAGNOSIS:  Encounter Diagnosis  Name Primary?   Malignant neoplasm of lower-inner quadrant of left breast in female, estrogen receptor positive (HCC) Yes    SUMMARY OF ONCOLOGIC HISTORY: Oncology History  Malignant neoplasm of lower-inner quadrant of left breast in female, estrogen receptor positive (HCC)  03/13/2019 Initial Diagnosis   Screening detected left breast abnormalities, 0.6 cm at 8:30 position and 0.9 cm at 8 o'clock position with 1 abnormal left axillary lymph node.  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%, PR 90%, HER-2 -1+, Ki-67 20%; biopsy 8:30 position: ER 80%, PR 80%, Ki-67 5%, HER-2 negative; lymph node biopsy negative   03/31/2019 Cancer Staging   Staging form: Breast, AJCC 8th Edition - Clinical stage from 03/31/2019: Stage IA (cT1b, cN0, cM0, G2, ER+, PR+, HER2-)   05/14/2019 Surgery   Bilateral mastectomies [facility]) [phone]):   Left mastectomy: Multifocal invasive lobular cancer largest focus 2.5 cm, grade 2, margins negative, 1/3 lymph node positive ER 80-95 %, PR 80-95 %, HER-2 negative, Ki-67 2-20%  Right mastectomy: Benign   05/21/2019 Cancer Staging   Staging form: Breast, AJCC 8th Edition - Pathologic stage from 05/21/2019: Stage IB (pT2, pN1a, cM0, G2, ER+, PR+, HER2-)   06/01/2019 Oncotype testing   Mammaprint: low risk   08/12/2019 - 09/25/2019 Radiation Therapy   The patient initially received a dose of 50.4 Gy in 28 fractions to the breast using whole-breast tangent fields. She also received 50.4 Gy in 28 fractions to the left supraclavicular region. This was delivered using a 3-D conformal technique. The pt received a boost delivering an  additional 10 Gy in 5 fractions using a electron boost with electrons. The total dose was 60.4 Gy.   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  She has had visible, palpable bilateral breast lumps with mild overlying skin discoloration for several months, more prominent on one side. They are nonpainful. She noticed increased prominence after recent weight loss and possible slight reduction in size with lymphatic flush treatments. She denies heavy weightlifting and usually does light exercise. She has not had similar symptoms before, aside from minor prior findings evaluated by gynecology of unclear relation.  She is in year 4 of planned 10-year adjuvant tamoxifen . She previously had bilateral mastectomies, with left therapeutic and right prophylactic, and adjuvant radiation of 50.4 Gy in 28 fractions with a 10 Gy boost. She is awaiting a breast MRI for further evaluation of a left-sided lesion identified by her breast imaging specialist. She continues semiannual Signatera testing, with the most recent result negative.  She reports intermittent episodes of a single finger turning purple, numb, and cold, occurring unpredictably, including indoors, and resolving spontaneously. She does not smoke.  She complains of abdominal discomfort and lumps on the abdominal wall.  She is also complaining of chest wall discomfort.  ALLERGIES:  is allergic to latex and doxycycline .  MEDICATIONS:  Current Outpatient Medications  Medication Sig  Dispense Refill   albuterol (VENTOLIN HFA) 108 (90 Base) MCG/ACT inhaler Inhale 2 puffs into the lungs every 4 (four) hours as needed.     Ascorbic Acid (VITAMIN C ) 1000 MG tablet Take 1 tablet (1,000 mg total) by mouth daily.     Bacillus Coagulans-Inulin (PROBIOTIC) 1-250 BILLION-MG CAPS Take 1 capsule by mouth daily at 12 noon.     Boswellia-Glucosamine-Vit D (OSTEO BI-FLEX ONE PER DAY) TABS Take 1 tablet by mouth daily.     cetirizine (ZYRTEC) 10 MG tablet Take 10 mg by mouth daily.      Cholecalciferol (VITAMIN D) 10 MCG/ML LIQD Vitamin D     cyclobenzaprine  (FLEXERIL ) 5 MG tablet TAKE 1 TABLET(5 MG) BY MOUTH THREE TIMES DAILY AS NEEDED FOR MUSCLE SPASMS 30 tablet 0   ELDERBERRY PO Take 15 mLs by mouth daily.     erythromycin  with ethanol (EMGEL ) 2 % gel Apply topically daily. 30 g 0   Multiple Vitamins-Minerals (MULTIVITAMIN ADULT EXTRA C PO) Take 1 tablet by mouth daily.      ondansetron  (ZOFRAN ) 4 MG tablet Take 1 tablet (4 mg total) by mouth every 8 (eight) hours as needed for up to 10 doses for nausea or vomiting. 10 tablet 0   tamoxifen  (NOLVADEX ) 20 MG tablet TAKE 1 TABLET(20 MG) BY MOUTH DAILY 7 tablet 52   traMADol  (ULTRAM ) 50 MG tablet Take 1 tablet (50 mg total) by mouth every 8 (eight) hours as needed for up to 10 doses for moderate pain (pain score 4-6) or severe pain (pain score 7-10). 10 tablet 0   VITAMIN D, CHOLECALCIFEROL, PO Take 1 tablet by mouth daily.     No current facility-administered medications for this visit.    PHYSICAL EXAMINATION: ECOG PERFORMANCE STATUS: 1 - Symptomatic but completely ambulatory  Vitals:   06/04/24 1602  BP: (!) 123/59  Pulse: 93  Resp: 16  Temp: 97.8 F (36.6 C)  SpO2: 100%   Filed Weights   06/04/24 1602  Weight: 136 lb 4.8 oz (61.8 kg)    Physical Exam Small palpable nodule in the left abdominal wall  (exam performed in the presence of a chaperone)  LABORATORY DATA:  I have reviewed the data as listed    Latest Ref  Rng & Units 05/31/2022    2:16 PM  CMP  Glucose 70 - 99 mg/dL 86   BUN 6 - 20 mg/dL 11   Creatinine 9.55 - 1.00 mg/dL 9.16   Sodium 864 - 854 mmol/L 140   Potassium 3.5 - 5.1 mmol/L 4.1   Chloride 98 - 111 mmol/L 105   CO2 22 - 32 mmol/L 31   Calcium 8.9 - 10.3 mg/dL 9.5   Total Protein 6.5 - 8.1 g/dL 7.3   Total Bilirubin 0.3 - 1.2 mg/dL 0.3   Alkaline Phos 38 - 126 U/L 64   AST 15 - 41 U/L 14   ALT 0 - 44 U/L 10     Lab Results  Component Value Date   WBC 6.1 05/31/2022   HGB 12.8 05/31/2022   HCT 39.1 05/31/2022   MCV 91.8 05/31/2022   PLT 236 05/31/2022   NEUTROABS 3.8 05/31/2022    ASSESSMENT & PLAN:  Malignant neoplasm of lower-inner quadrant of left breast in female, estrogen receptor positive (HCC) 03/13/2019:Screening detected left breast abnormalities, 0.6 cm at 8:30 position and 0.9 cm at 8 o'clock  position with 1 abnormal left axillary lymph node.  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%, PR 90%, HER-2 -1+, Ki-67 20%; biopsy 8:30 position: ER 80%, PR 80%, Ki-67 5%, HER-2 negative; lymph node biopsy negative T1 BN 0 stage Ia clinical stage   Bilateral mastectomies: Left mastectomy: Multifocal invasive lobular cancer largest focus 2.5 cm, grade 2, margins negative, 1/3 lymph node positive ER 80-95 %, PR 80-95 %, HER-2 negative, Ki-67 2-20% right mastectomy: Benign T2 N1 a stage Ib   MammaPrint: Low risk Adjuvant radiation: 08/12/2019-09/25/2019   Treatment plan:Adjuvant antiestrogen therapy: We discussed tamoxifen  for 10 years versus tamoxifen  for 3 years followed by aromatase inhibitor for 5 years if she is menopausal   Tamoxifen  Toxicities: Hot flashes and joint stiffness: Managing it very well. .   Breast Cancer Surveillance: U/S: Left breast: Indeterminate mass left breast 0.9 cm, Biopsy 03/07/20: Benign 07/17/20: MRI Brain: Neg 07/23/20: CT CAP: No def evidence of Mets, small sclerotic bone  islands 07/24/20: Bone scan: No bone mets   Emotional issues: Patient is doing much better since its been a year since her husband had left her.  She is definitely happier and appears to be at peace.   Breast cancer surveillance: 1.  Breast exam 06/04/2024 benign  2. breast MRI scheduled for 06/28/2024 3. Signatera testing for minimal residual disease: Negative   We will obtain a PET CT scan for further evaluation of the abdominal wall masses and abdominal pain. She also has chest wall discomfort which will also be evaluated by the PET/CT.   RTC in 1 year   No orders of the defined types were placed in this encounter.  The patient has a good understanding of the overall plan. she agrees with it. she will call with any problems that may develop before the next visit here.  I personally spent a total of 30 minutes in the care of the patient today including preparing to see the patient, getting/reviewing separately obtained history, performing a medically appropriate exam/evaluation, counseling and educating, placing orders, referring and communicating with other health care professionals, documenting clinical information in the EHR, independently interpreting results, communicating results, and coordinating care.   Dr.Hawraa Stambaugh 06/04/24    "

## 2024-06-04 NOTE — Assessment & Plan Note (Signed)
 03/13/2019:Screening detected left breast abnormalities, 0.6 cm at 8:30 position and 0.9 cm at 8 o'clock position with 1 abnormal left axillary lymph node.  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%, PR 90%, HER-2 -1+, Ki-67 20%; biopsy 8:30 position: ER 80%, PR 80%, Ki-67 5%, HER-2 negative; lymph node biopsy negative T1 BN 0 stage Ia clinical stage   Bilateral mastectomies: Left mastectomy: Multifocal invasive lobular cancer largest focus 2.5 cm, grade 2, margins negative, 1/3 lymph node positive ER 80-95 %, PR 80-95 %, HER-2 negative, Ki-67 2-20% right mastectomy: Benign T2 N1 a stage Ib   MammaPrint: Low risk Adjuvant radiation: 08/12/2019-09/25/2019   Treatment plan:Adjuvant antiestrogen therapy: We discussed tamoxifen  for 10 years versus tamoxifen  for 3 years followed by aromatase inhibitor for 5 years if she is menopausal   Tamoxifen  Toxicities: Hot flashes and joint stiffness: Managing it very well. .   Breast Cancer Surveillance: U/S: Left breast: Indeterminate mass left breast 0.9 cm, Biopsy 03/07/20: Benign 07/17/20: MRI Brain: Neg 07/23/20: CT CAP: No def evidence of Mets, small sclerotic bone islands 07/24/20: Bone scan: No bone mets   Emotional issues: Patient is doing much better since its been a year since her husband had left her.  She is definitely happier and appears to be at peace.   Breast cancer surveillance: 1.  Breast exam 06/04/2024 benign  2. breast MRI scheduled for 06/28/2024 3. Signatera testing for minimal residual disease: Negative    RTC in 1 year

## 2024-06-05 ENCOUNTER — Encounter: Admitting: Student

## 2024-06-10 ENCOUNTER — Telehealth: Payer: Self-pay | Admitting: *Deleted

## 2024-06-10 NOTE — Telephone Encounter (Signed)
 Received call from pt stating she is scheduled for PET scan but was told no PA is on file.  RN sent inbasket to PA team to work on this.  Pt will be notified of approval once obtained.

## 2024-06-12 ENCOUNTER — Ambulatory Visit: Admitting: Plastic Surgery

## 2024-06-16 ENCOUNTER — Encounter: Admitting: Plastic Surgery

## 2024-06-26 ENCOUNTER — Other Ambulatory Visit

## 2024-06-26 ENCOUNTER — Ambulatory Visit: Admitting: Plastic Surgery

## 2024-07-03 ENCOUNTER — Ambulatory Visit (HOSPITAL_COMMUNITY)

## 2024-07-03 ENCOUNTER — Encounter: Admitting: Student

## 2024-07-13 ENCOUNTER — Inpatient Hospital Stay: Attending: Hematology and Oncology | Admitting: Hematology and Oncology

## 2024-11-20 ENCOUNTER — Ambulatory Visit: Admitting: Plastic Surgery

## 2024-12-28 ENCOUNTER — Ambulatory Visit

## 2025-06-07 ENCOUNTER — Inpatient Hospital Stay: Admitting: Hematology and Oncology
